# Patient Record
Sex: Female | Born: 1935
Health system: Southern US, Community
[De-identification: ages and names within clinical notes are randomized; demographics above are authoritative.]

## PROBLEM LIST (undated history)

## (undated) DIAGNOSIS — R9430 Abnormal result of cardiovascular function study, unspecified: Secondary | ICD-10-CM

## (undated) DIAGNOSIS — I10 Essential (primary) hypertension: Secondary | ICD-10-CM

## (undated) DIAGNOSIS — K5900 Constipation, unspecified: Secondary | ICD-10-CM

## (undated) DIAGNOSIS — N904 Leukoplakia of vulva: Secondary | ICD-10-CM

## (undated) DIAGNOSIS — E785 Hyperlipidemia, unspecified: Secondary | ICD-10-CM

## (undated) DIAGNOSIS — E7800 Pure hypercholesterolemia, unspecified: Secondary | ICD-10-CM

## (undated) DIAGNOSIS — Z9189 Other specified personal risk factors, not elsewhere classified: Secondary | ICD-10-CM

## (undated) DIAGNOSIS — I471 Supraventricular tachycardia: Secondary | ICD-10-CM

## (undated) DIAGNOSIS — J019 Acute sinusitis, unspecified: Secondary | ICD-10-CM

## (undated) DIAGNOSIS — R5383 Other fatigue: Secondary | ICD-10-CM

## (undated) DIAGNOSIS — J329 Chronic sinusitis, unspecified: Secondary | ICD-10-CM

## (undated) DIAGNOSIS — N952 Postmenopausal atrophic vaginitis: Secondary | ICD-10-CM

## (undated) DIAGNOSIS — F4321 Adjustment disorder with depressed mood: Secondary | ICD-10-CM

## (undated) DIAGNOSIS — D649 Anemia, unspecified: Secondary | ICD-10-CM

## (undated) DIAGNOSIS — K219 Gastro-esophageal reflux disease without esophagitis: Secondary | ICD-10-CM

## (undated) DIAGNOSIS — N809 Endometriosis, unspecified: Secondary | ICD-10-CM

## (undated) DIAGNOSIS — Z0000 Encounter for general adult medical examination without abnormal findings: Principal | ICD-10-CM

## (undated) DIAGNOSIS — M810 Age-related osteoporosis without current pathological fracture: Secondary | ICD-10-CM

## (undated) DIAGNOSIS — IMO0001 Reserved for inherently not codable concepts without codable children: Secondary | ICD-10-CM

## (undated) DIAGNOSIS — I498 Other specified cardiac arrhythmias: Secondary | ICD-10-CM

## (undated) DIAGNOSIS — H6190 Disorder of external ear, unspecified, unspecified ear: Secondary | ICD-10-CM

## (undated) DIAGNOSIS — N3281 Overactive bladder: Secondary | ICD-10-CM

## (undated) DIAGNOSIS — R000 Tachycardia, unspecified: Secondary | ICD-10-CM

## (undated) DIAGNOSIS — K5730 Diverticulosis of large intestine without perforation or abscess without bleeding: Secondary | ICD-10-CM

## (undated) DIAGNOSIS — M1990 Unspecified osteoarthritis, unspecified site: Secondary | ICD-10-CM

## (undated) DIAGNOSIS — F418 Other specified anxiety disorders: Secondary | ICD-10-CM

## (undated) DIAGNOSIS — D539 Nutritional anemia, unspecified: Secondary | ICD-10-CM

## (undated) DIAGNOSIS — Z8619 Personal history of other infectious and parasitic diseases: Secondary | ICD-10-CM

## (undated) HISTORY — DX: Other specified anxiety disorders: F41.8

## (undated) HISTORY — DX: Gastro-esophageal reflux disease without esophagitis: K21.9

## (undated) HISTORY — PX: ABDOMINAL HYSTERECTOMY: SHX81

## (undated) HISTORY — DX: Nutritional anemia, unspecified: D53.9

## (undated) HISTORY — PX: OTHER SURGICAL HISTORY: SHX169

## (undated) HISTORY — DX: Age-related osteoporosis without current pathological fracture: M81.0

## (undated) HISTORY — DX: Other fatigue: R53.83

## (undated) HISTORY — DX: Pure hypercholesterolemia, unspecified: E78.00

## (undated) HISTORY — DX: Unspecified osteoarthritis, unspecified site: M19.90

## (undated) HISTORY — DX: Endometriosis, unspecified: N80.9

## (undated) HISTORY — DX: Other specified cardiac arrhythmias: I49.8

## (undated) HISTORY — DX: Encounter for general adult medical examination without abnormal findings: Z00.00

## (undated) HISTORY — DX: Postmenopausal atrophic vaginitis: N95.2

## (undated) HISTORY — DX: Diverticulosis of large intestine without perforation or abscess without bleeding: K57.30

## (undated) HISTORY — DX: Acute sinusitis, unspecified: J01.90

## (undated) HISTORY — DX: Overactive bladder: N32.81

## (undated) HISTORY — DX: Disorder of external ear, unspecified, unspecified ear: H61.90

## (undated) HISTORY — DX: Adjustment disorder with depressed mood: F43.21

## (undated) HISTORY — DX: Leukoplakia of vulva: N90.4

## (undated) HISTORY — DX: Constipation, unspecified: K59.00

## (undated) HISTORY — PX: BREAST SURGERY: SHX581

## (undated) HISTORY — DX: Reserved for inherently not codable concepts without codable children: IMO0001

## (undated) HISTORY — DX: Tachycardia, unspecified: R00.0

## (undated) HISTORY — DX: Anemia, unspecified: D64.9

## (undated) HISTORY — DX: Chronic sinusitis, unspecified: J32.9

## (undated) HISTORY — DX: Supraventricular tachycardia: I47.1

## (undated) HISTORY — DX: Hyperlipidemia, unspecified: E78.5

## (undated) HISTORY — DX: Abnormal result of cardiovascular function study, unspecified: R94.30

## (undated) HISTORY — DX: Essential (primary) hypertension: I10

## (undated) HISTORY — DX: Personal history of other infectious and parasitic diseases: Z86.19

## (undated) HISTORY — DX: Other specified personal risk factors, not elsewhere classified: Z91.89

---

## 1998-06-15 ENCOUNTER — Ambulatory Visit (HOSPITAL_COMMUNITY): Admission: RE | Admit: 1998-06-15 | Discharge: 1998-06-15 | Payer: Self-pay | Admitting: Obstetrics and Gynecology

## 1999-07-09 ENCOUNTER — Encounter: Payer: Self-pay | Admitting: Obstetrics and Gynecology

## 1999-07-09 ENCOUNTER — Ambulatory Visit (HOSPITAL_COMMUNITY): Admission: RE | Admit: 1999-07-09 | Discharge: 1999-07-09 | Payer: Self-pay | Admitting: Obstetrics and Gynecology

## 2000-09-22 ENCOUNTER — Ambulatory Visit (HOSPITAL_COMMUNITY): Admission: RE | Admit: 2000-09-22 | Discharge: 2000-09-22 | Payer: Self-pay | Admitting: Family Medicine

## 2000-09-22 ENCOUNTER — Encounter: Payer: Self-pay | Admitting: Family Medicine

## 2003-09-16 ENCOUNTER — Encounter: Payer: Self-pay | Admitting: Internal Medicine

## 2005-01-07 ENCOUNTER — Ambulatory Visit: Payer: Self-pay | Admitting: Internal Medicine

## 2005-07-08 ENCOUNTER — Ambulatory Visit: Payer: Self-pay | Admitting: Internal Medicine

## 2005-07-26 ENCOUNTER — Ambulatory Visit: Payer: Self-pay | Admitting: Gastroenterology

## 2005-08-05 ENCOUNTER — Ambulatory Visit: Payer: Self-pay | Admitting: Gastroenterology

## 2005-08-05 ENCOUNTER — Encounter: Payer: Self-pay | Admitting: Internal Medicine

## 2005-08-14 ENCOUNTER — Ambulatory Visit: Payer: Self-pay | Admitting: Internal Medicine

## 2005-08-19 ENCOUNTER — Ambulatory Visit: Payer: Self-pay | Admitting: Internal Medicine

## 2005-11-11 DIAGNOSIS — I471 Supraventricular tachycardia, unspecified: Secondary | ICD-10-CM

## 2005-11-11 HISTORY — DX: Supraventricular tachycardia, unspecified: I47.10

## 2005-11-11 HISTORY — DX: Supraventricular tachycardia: I47.1

## 2005-11-22 ENCOUNTER — Ambulatory Visit: Payer: Self-pay | Admitting: Internal Medicine

## 2005-12-13 ENCOUNTER — Ambulatory Visit: Payer: Self-pay | Admitting: Internal Medicine

## 2006-01-23 ENCOUNTER — Ambulatory Visit: Payer: Self-pay | Admitting: Internal Medicine

## 2006-02-19 ENCOUNTER — Ambulatory Visit: Payer: Self-pay | Admitting: Internal Medicine

## 2006-03-04 ENCOUNTER — Ambulatory Visit: Payer: Self-pay

## 2006-03-04 ENCOUNTER — Encounter: Payer: Self-pay | Admitting: Cardiology

## 2006-03-07 ENCOUNTER — Encounter: Payer: Self-pay | Admitting: Internal Medicine

## 2006-03-14 ENCOUNTER — Ambulatory Visit: Payer: Self-pay | Admitting: Internal Medicine

## 2006-03-28 ENCOUNTER — Ambulatory Visit: Payer: Self-pay | Admitting: Internal Medicine

## 2006-05-13 ENCOUNTER — Ambulatory Visit: Payer: Self-pay | Admitting: Internal Medicine

## 2006-07-08 ENCOUNTER — Ambulatory Visit: Payer: Self-pay | Admitting: Internal Medicine

## 2006-07-18 ENCOUNTER — Ambulatory Visit: Payer: Self-pay | Admitting: Cardiology

## 2006-08-07 ENCOUNTER — Ambulatory Visit: Payer: Self-pay | Admitting: Cardiology

## 2006-08-13 ENCOUNTER — Ambulatory Visit: Payer: Self-pay | Admitting: Internal Medicine

## 2006-08-13 DIAGNOSIS — M810 Age-related osteoporosis without current pathological fracture: Secondary | ICD-10-CM

## 2006-08-13 DIAGNOSIS — D649 Anemia, unspecified: Secondary | ICD-10-CM | POA: Insufficient documentation

## 2006-08-13 DIAGNOSIS — D539 Nutritional anemia, unspecified: Secondary | ICD-10-CM

## 2006-08-13 HISTORY — DX: Nutritional anemia, unspecified: D53.9

## 2006-08-13 HISTORY — DX: Age-related osteoporosis without current pathological fracture: M81.0

## 2006-08-20 ENCOUNTER — Ambulatory Visit: Payer: Self-pay | Admitting: Cardiology

## 2006-09-29 ENCOUNTER — Ambulatory Visit: Payer: Self-pay | Admitting: Cardiology

## 2006-11-17 ENCOUNTER — Ambulatory Visit: Payer: Self-pay | Admitting: Internal Medicine

## 2006-11-17 LAB — CONVERTED CEMR LAB
Basophils Absolute: 0.1 10*3/uL (ref 0.0–0.1)
Basophils Relative: 1 % (ref 0.0–1.0)
Eosinophil percent: 1.2 % (ref 0.0–5.0)
HCT: 36.1 % (ref 36.0–46.0)
Hemoglobin: 12.4 g/dL (ref 12.0–15.0)
Lymphocytes Relative: 42.2 % (ref 12.0–46.0)
MCHC: 34.3 g/dL (ref 30.0–36.0)
MCV: 87.8 fL (ref 78.0–100.0)
Monocytes Absolute: 0.5 10*3/uL (ref 0.2–0.7)
Monocytes Relative: 7.6 % (ref 3.0–11.0)
Neutro Abs: 3.4 10*3/uL (ref 1.4–7.7)
Neutrophils Relative %: 48 % (ref 43.0–77.0)
Platelets: 237 10*3/uL (ref 150–400)
RBC: 4.11 M/uL (ref 3.87–5.11)
RDW: 11.4 % — ABNORMAL LOW (ref 11.5–14.6)
WBC: 7.1 10*3/uL (ref 4.5–10.5)

## 2007-03-19 ENCOUNTER — Ambulatory Visit: Payer: Self-pay | Admitting: Cardiology

## 2007-08-25 ENCOUNTER — Encounter: Payer: Self-pay | Admitting: Internal Medicine

## 2007-12-15 ENCOUNTER — Encounter: Payer: Self-pay | Admitting: Internal Medicine

## 2007-12-17 ENCOUNTER — Ambulatory Visit: Payer: Self-pay | Admitting: Internal Medicine

## 2007-12-17 DIAGNOSIS — E785 Hyperlipidemia, unspecified: Secondary | ICD-10-CM

## 2007-12-17 HISTORY — DX: Hyperlipidemia, unspecified: E78.5

## 2007-12-17 LAB — CONVERTED CEMR LAB
Basophils Absolute: 0 10*3/uL (ref 0.0–0.1)
Basophils Relative: 0.4 % (ref 0.0–1.0)
CO2: 33 meq/L — ABNORMAL HIGH (ref 19–32)
Creatinine, Ser: 0.8 mg/dL (ref 0.4–1.2)
Direct LDL: 159.6 mg/dL
Glucose, Urine, Semiquant: NEGATIVE
HCT: 35.1 % — ABNORMAL LOW (ref 36.0–46.0)
Hemoglobin: 11.8 g/dL — ABNORMAL LOW (ref 12.0–15.0)
Lymphocytes Relative: 49.3 % — ABNORMAL HIGH (ref 12.0–46.0)
MCHC: 33.7 g/dL (ref 30.0–36.0)
Monocytes Absolute: 0.5 10*3/uL (ref 0.2–0.7)
Neutro Abs: 2.2 10*3/uL (ref 1.4–7.7)
Neutrophils Relative %: 39.4 % — ABNORMAL LOW (ref 43.0–77.0)
Potassium: 3.6 meq/L (ref 3.5–5.1)
RDW: 11.7 % (ref 11.5–14.6)
Sodium: 141 meq/L (ref 135–145)
TSH: 2.48 microintl units/mL (ref 0.35–5.50)
Total Bilirubin: 1 mg/dL (ref 0.3–1.2)
Total CHOL/HDL Ratio: 3.4
Total Protein: 7.5 g/dL (ref 6.0–8.3)
WBC Urine, dipstick: NEGATIVE
pH: 5.5

## 2007-12-21 ENCOUNTER — Ambulatory Visit: Payer: Self-pay | Admitting: Cardiology

## 2007-12-28 ENCOUNTER — Ambulatory Visit: Payer: Self-pay | Admitting: Internal Medicine

## 2007-12-28 DIAGNOSIS — K219 Gastro-esophageal reflux disease without esophagitis: Secondary | ICD-10-CM

## 2007-12-28 HISTORY — DX: Gastro-esophageal reflux disease without esophagitis: K21.9

## 2008-01-08 ENCOUNTER — Ambulatory Visit: Payer: Self-pay | Admitting: Gastroenterology

## 2008-01-21 ENCOUNTER — Encounter: Payer: Self-pay | Admitting: Internal Medicine

## 2008-01-21 ENCOUNTER — Ambulatory Visit: Payer: Self-pay | Admitting: Gastroenterology

## 2008-02-01 ENCOUNTER — Ambulatory Visit: Payer: Self-pay | Admitting: Internal Medicine

## 2008-02-01 LAB — CONVERTED CEMR LAB
Basophils Relative: 1.1 % — ABNORMAL HIGH (ref 0.0–1.0)
Ferritin: 90.1 ng/mL (ref 10.0–291.0)
Iron: 99 ug/dL (ref 42–145)
Monocytes Relative: 8.2 % (ref 3.0–11.0)
Platelets: 212 10*3/uL (ref 150–400)
RBC: 4.16 M/uL (ref 3.87–5.11)
RDW: 11.9 % (ref 11.5–14.6)
Transferrin: 228.8 mg/dL (ref >=212.0)
Vitamin B-12: 210 pg/mL — ABNORMAL LOW (ref 211–911)

## 2008-02-04 ENCOUNTER — Inpatient Hospital Stay (HOSPITAL_COMMUNITY): Admission: EM | Admit: 2008-02-04 | Discharge: 2008-02-06 | Payer: Self-pay | Admitting: Emergency Medicine

## 2008-02-09 ENCOUNTER — Ambulatory Visit: Payer: Self-pay | Admitting: Internal Medicine

## 2008-03-10 ENCOUNTER — Ambulatory Visit: Payer: Self-pay | Admitting: Internal Medicine

## 2008-05-06 ENCOUNTER — Telehealth: Payer: Self-pay | Admitting: Internal Medicine

## 2008-07-14 ENCOUNTER — Ambulatory Visit: Payer: Self-pay | Admitting: Internal Medicine

## 2008-07-15 ENCOUNTER — Encounter: Payer: Self-pay | Admitting: Internal Medicine

## 2008-07-15 LAB — HM MAMMOGRAPHY: HM Mammogram: NORMAL

## 2008-10-26 ENCOUNTER — Ambulatory Visit: Payer: Self-pay | Admitting: Internal Medicine

## 2008-12-27 ENCOUNTER — Ambulatory Visit: Payer: Self-pay | Admitting: Cardiology

## 2009-02-02 ENCOUNTER — Ambulatory Visit: Payer: Self-pay | Admitting: Internal Medicine

## 2009-02-06 LAB — CONVERTED CEMR LAB
Alkaline Phosphatase: 66 units/L (ref 39–117)
Amylase: 53 units/L (ref 27–131)
Basophils Absolute: 0.1 10*3/uL (ref 0.0–0.1)
Basophils Relative: 0.7 % (ref 0.0–3.0)
Bilirubin, Direct: 0 mg/dL (ref 0.0–0.3)
CO2: 33 meq/L — ABNORMAL HIGH (ref 19–32)
Calcium: 10.4 mg/dL (ref 8.4–10.5)
Creatinine, Ser: 0.9 mg/dL (ref 0.4–1.2)
Eosinophils Absolute: 0.1 10*3/uL (ref 0.0–0.7)
HDL: 79.1 mg/dL (ref >39.00)
Lymphocytes Relative: 8.5 % — ABNORMAL LOW (ref 12.0–46.0)
MCHC: 35.7 g/dL (ref 30.0–36.0)
Neutrophils Relative %: 85.4 % — ABNORMAL HIGH (ref 43.0–77.0)
RBC: 4.12 M/uL (ref 3.87–5.11)
Total Bilirubin: 0.8 mg/dL (ref 0.3–1.2)
Total CHOL/HDL Ratio: 3
Total Protein: 8.7 g/dL — ABNORMAL HIGH (ref 6.0–8.3)
Triglycerides: 125 mg/dL (ref 0.0–149.0)
VLDL: 25 mg/dL (ref 0.0–40.0)

## 2009-02-16 ENCOUNTER — Ambulatory Visit: Payer: Self-pay | Admitting: Internal Medicine

## 2009-02-20 LAB — CONVERTED CEMR LAB
BUN: 16 mg/dL (ref 6–23)
Basophils Absolute: 0.1 10*3/uL (ref 0.0–0.1)
Calcium: 9.7 mg/dL (ref 8.4–10.5)
Creatinine, Ser: 0.8 mg/dL (ref 0.4–1.2)
Eosinophils Relative: 2.3 % (ref 0.0–5.0)
GFR calc non Af Amer: 74.65 mL/min (ref >60)
Monocytes Relative: 7.6 % (ref 3.0–12.0)
Neutrophils Relative %: 43.8 % (ref 43.0–77.0)
Platelets: 195 10*3/uL (ref 150.0–400.0)
RDW: 11.6 % (ref 11.5–14.6)
WBC: 5.7 10*3/uL (ref 4.5–10.5)

## 2009-05-11 ENCOUNTER — Telehealth: Payer: Self-pay | Admitting: Internal Medicine

## 2009-05-18 ENCOUNTER — Ambulatory Visit: Payer: Self-pay | Admitting: Internal Medicine

## 2009-07-12 ENCOUNTER — Telehealth: Payer: Self-pay | Admitting: Cardiology

## 2009-08-10 ENCOUNTER — Ambulatory Visit: Payer: Self-pay | Admitting: Internal Medicine

## 2009-11-11 DIAGNOSIS — R5383 Other fatigue: Secondary | ICD-10-CM

## 2009-11-11 HISTORY — DX: Other fatigue: R53.83

## 2009-11-13 ENCOUNTER — Ambulatory Visit: Payer: Self-pay | Admitting: Cardiology

## 2009-12-27 ENCOUNTER — Ambulatory Visit: Payer: Self-pay | Admitting: Cardiology

## 2010-01-30 ENCOUNTER — Ambulatory Visit: Payer: Self-pay | Admitting: Family Medicine

## 2010-02-16 ENCOUNTER — Telehealth: Payer: Self-pay | Admitting: Family Medicine

## 2010-02-28 ENCOUNTER — Ambulatory Visit: Payer: Self-pay | Admitting: Internal Medicine

## 2010-03-26 LAB — CONVERTED CEMR LAB
ALT: 14 units/L (ref 0–35)
AST: 18 units/L (ref 0–37)
Albumin: 3.9 g/dL (ref 3.5–5.2)
Alkaline Phosphatase: 48 units/L (ref 39–117)
Basophils Absolute: 0.1 10*3/uL (ref 0.0–0.1)
Basophils Relative: 1 % (ref 0.0–3.0)
Bilirubin, Direct: 0 mg/dL (ref 0.0–0.3)
CO2: 34 meq/L — ABNORMAL HIGH (ref 19–32)
Creatinine, Ser: 0.9 mg/dL (ref 0.4–1.2)
Direct LDL: 131.9 mg/dL
Eosinophils Absolute: 0.1 10*3/uL (ref 0.0–0.7)
Eosinophils Relative: 1.7 % (ref 0.0–5.0)
GFR calc non Af Amer: 64.98 mL/min (ref >60)
Glucose, Bld: 89 mg/dL (ref 70–99)
HCT: 35.1 % — ABNORMAL LOW (ref 36.0–46.0)
HDL: 86.4 mg/dL (ref >39.00)
Hemoglobin: 11.9 g/dL — ABNORMAL LOW (ref 12.0–15.0)
Lymphocytes Relative: 48.6 % — ABNORMAL HIGH (ref 12.0–46.0)
Lymphs Abs: 2.4 10*3/uL (ref 0.7–4.0)
Monocytes Relative: 8.3 % (ref 3.0–12.0)
Neutro Abs: 2 10*3/uL (ref 1.4–7.7)
Platelets: 223 10*3/uL (ref 150.0–400.0)
Potassium: 3.5 meq/L (ref 3.5–5.1)
RBC: 3.96 M/uL (ref 3.87–5.11)
RDW: 13.1 % (ref 11.5–14.6)
Sodium: 143 meq/L (ref 135–145)
TSH: 3.21 microintl units/mL (ref 0.35–5.50)
Total CHOL/HDL Ratio: 3
Total Protein: 8.3 g/dL (ref 6.0–8.3)
VLDL: 27.2 mg/dL (ref 0.0–40.0)
WBC: 5 10*3/uL (ref 4.5–10.5)

## 2010-04-02 ENCOUNTER — Ambulatory Visit: Payer: Self-pay | Admitting: Internal Medicine

## 2010-04-02 LAB — CONVERTED CEMR LAB
Basophils Relative: 1.2 % (ref 0.0–3.0)
Eosinophils Relative: 1.3 % (ref 0.0–5.0)
Ferritin: 57.5 ng/mL (ref 10.0–291.0)
Iron: 114 ug/dL (ref 42–145)
Lymphocytes Relative: 47.7 % — ABNORMAL HIGH (ref 12.0–46.0)
MCV: 88.5 fL (ref 78.0–100.0)
Monocytes Absolute: 0.5 10*3/uL (ref 0.1–1.0)
Monocytes Relative: 8.1 % (ref 3.0–12.0)
Neutrophils Relative %: 41.7 % — ABNORMAL LOW (ref 43.0–77.0)
Platelets: 228 10*3/uL (ref 150.0–400.0)
RBC: 3.96 M/uL (ref 3.87–5.11)
Saturation Ratios: 29.1 % (ref 20.0–50.0)
Transferrin: 280 mg/dL (ref 212.0–360.0)
Vitamin B-12: 238 pg/mL (ref 211–911)
WBC: 5.9 10*3/uL (ref 4.5–10.5)

## 2010-05-02 ENCOUNTER — Encounter: Payer: Self-pay | Admitting: Internal Medicine

## 2010-07-26 ENCOUNTER — Ambulatory Visit: Payer: Self-pay | Admitting: Internal Medicine

## 2010-09-17 ENCOUNTER — Ambulatory Visit: Payer: Self-pay | Admitting: Cardiology

## 2010-09-17 DIAGNOSIS — I498 Other specified cardiac arrhythmias: Secondary | ICD-10-CM

## 2010-09-17 HISTORY — DX: Other specified cardiac arrhythmias: I49.8

## 2010-12-11 NOTE — Assessment & Plan Note (Signed)
Summary: ear wash/cjr   Vital Signs:  Patient profile:   77 year old female Temp:     98.6 degrees F oral BP sitting:   140 / 74  (left arm) Cuff size:   regular  Vitals Entered By: Sid Falcon LPN (January 30, 2010 11:22 AM) CC: Hearing problems, ear wash?   History of Present Illness: Patient seen with decreased hearing bilaterally. Went for hearing screen yesterday was told she had bilateral cerumen impactions. No history of prior. Denies any dizziness. No ear pain.  Occ tinnitus.  Allergies (verified): No Known Drug Allergies  Past History:  Past Medical History: Last updated: 12/27/2009 SVT...3-4 beats... Holter... 2007 sinus tachycardia... mild... resting Fatigue with exercise... January, 2011... responded to increased beta blocker dose Dyslipidemia Anemia-iron deficiency GERD Osteoporosis EF 55%... echo... April, 2007 Nuclear stress test... November, 2004... no ischemia PMH reviewed for relevance  Review of Systems      See HPI  Physical Exam  General:  Well-developed,well-nourished,in no acute distress; alert,appropriate and cooperative throughout examination Ears:  bilateral cerumen impactions.  following irrigation of both canals cerumen is removed in entirety. Eardrums appear normal. Mouth:  Oral mucosa and oropharynx without lesions or exudates.  Teeth in good repair. Lungs:  Normal respiratory effort, chest expands symmetrically. Lungs are clear to auscultation, no crackles or wheezes. Heart:  normal rate and regular rhythm.     Impression & Recommendations:  Problem # 1:  CERUMEN IMPACTION (ICD-380.4) Assessment New irrigation both canals with successfull removal of cerumen.  Complete Medication List: 1)  Fortical 200 Unit/act Nasal Soln (Calcitonin (salmon)) .Marland Kitchen.. 1 puff alternating nostril daily 2)  Famotidine 20 Mg Tabs (Famotidine) .... Once daily prn 3)  Hydrochlorothiazide 25 Mg Tabs (Hydrochlorothiazide) .... Qd 4)  Premarin 0.3 Mg Tabs  (Estrogens conjugated) .... Once daily prn 5)  Slow Release Iron 50 Mg Tbcr (Iron) .... Once daily prn 6)  Diltiazem Hcl Cr 120 Mg Cp12 (Diltiazem hcl) .... Qd 7)  Metoprolol Succinate 50 Mg Xr24h-tab (Metoprolol succinate) .... Take one tablet by mouth daily 8)  Miralax Powd (Polyethylene glycol 3350) .... As needed 9)  Aspirin 81 Mg Tbec (Aspirin) .... Once daily 10)  Caltrate 600+d 600-400 Mg-unit Tabs (Calcium carbonate-vitamin d) .... Two times a day 11)  Fish Oil 1000 Mg Caps (Omega-3 fatty acids) .... Tid 12)  Vitamin D 1000 Unit Caps (Cholecalciferol) .... Once daily

## 2010-12-11 NOTE — Miscellaneous (Signed)
  Clinical Lists Changes  Observations: Added new observation of PAST MED HX: SVT...3-4 beats... Holter... 2007 sinus tachycardia... mild... resting Dyslipidemia Anemia-iron deficiency GERD Osteoporosis EF 55%... echo... April, 2007 Nuclear stress test... November, 2004... no ischemia  (11/13/2009 10:42)       Past History:  Past Medical History: SVT...3-4 beats... Holter... 2007 sinus tachycardia... mild... resting Dyslipidemia Anemia-iron deficiency GERD Osteoporosis EF 55%... echo... April, 2007 Nuclear stress test... November, 2004... no ischemia

## 2010-12-11 NOTE — Assessment & Plan Note (Signed)
Summary: 6wks/sl      Allergies Added: NKDA  Visit Type:  Follow-up Primary Provider:  Dr.Bruce Swords  CC:  tachycardia.  History of Present Illness: The patient is seen for followup of fatigue and tachycardia. When I saw her recently it appeared that she might be having increased heart rate with exercise.  Her beta blocker dose was increased.  She is greatly improved.  Current Medications (verified): 1)  Fortical 200 Unit/act Nasal Soln (Calcitonin (Salmon)) .Marland Kitchen.. 1 Puff Alternating Nostril Daily 2)  Famotidine 20 Mg Tabs (Famotidine) .... Once Daily Prn 3)  Hydrochlorothiazide 25 Mg Tabs (Hydrochlorothiazide) .... Qd 4)  Premarin 0.3 Mg Tabs (Estrogens Conjugated) .... Once Daily Prn 5)  Slow Release Iron 50 Mg Tbcr (Iron) .... Once Daily Prn 6)  Diltiazem Hcl Cr 120 Mg Cp12 (Diltiazem Hcl) .... Qd 7)  Metoprolol Succinate 50 Mg Xr24h-Tab (Metoprolol Succinate) .... Take One Tablet By Mouth Daily 8)  Miralax   Powd (Polyethylene Glycol 3350) .... As Needed 9)  Aspirin 81 Mg  Tbec (Aspirin) .... Once Daily 10)  Caltrate 600+d 600-400 Mg-Unit  Tabs (Calcium Carbonate-Vitamin D) .... Two Times A Day 11)  Fish Oil 1000 Mg  Caps (Omega-3 Fatty Acids) .... Tid 12)  Vitamin D 1000 Unit  Caps (Cholecalciferol) .... Once Daily  Allergies (verified): No Known Drug Allergies  Past History:  Past Medical History: SVT...3-4 beats... Holter... 2007 sinus tachycardia... mild... resting Fatigue with exercise... January, 2011... responded to increased beta blocker dose Dyslipidemia Anemia-iron deficiency GERD Osteoporosis EF 55%... echo... April, 2007 Nuclear stress test... November, 2004... no ischemia  Review of Systems       Patient denies fever, chills, headache, sweats, rash, change in vision, change in hearing, chest pain, cough, shortness of breath, nausea vomiting, urinary symptoms.  All of the systems are reviewed and are negative.  Vital Signs:  Patient profile:   77 year  old female Height:      63.5 inches Weight:      120 pounds BMI:     21.00 Pulse rate:   75 / minute BP sitting:   114 / 70  (left arm) Cuff size:   regular  Vitals Entered By: Hardin Negus, RMA (December 27, 2009 10:21 AM)  Physical Exam  General:  patient is quite stable in general. Eyes:  no xanthelasma. Neck:  no jugular venous distention. Lungs:  lungs are clear.  Respiratory effort is nonlabored. Heart:  cardiac exam reveals S1 and S2.  No clicks or significant murmurs. Abdomen:  abdomen soft. Extremities:  no peripheral edema. Psych:  patient is oriented to person time and place.  Affect is normal.   Impression & Recommendations:  Problem # 1:  * FATIGUE WITH EXERCISE With the  additional beta blocker the patient's fatigue with exercise is greatly improved.we will keep her on this dose of medication.  Problem # 2:  GRIEF REACTION, ACUTE (ICD-309.0) The patient appears to be doing better in this regard also.  Problem # 3:  SUPRAVENTRICULAR TACHYCARDIA (ICD-427.89)  Her updated medication list for this problem includes:    Diltiazem Hcl Cr 120 Mg Cp12 (Diltiazem hcl) ..... Qd    Metoprolol Succinate 50 Mg Xr24h-tab (Metoprolol succinate) .Marland Kitchen... Take one tablet by mouth daily    Aspirin 81 Mg Tbec (Aspirin) ..... Once daily There is no evidence that she's having any supraventricular tachycardia.  No change in therapy.  Patient Instructions: 1)  Follow up in 9 months

## 2010-12-11 NOTE — Assessment & Plan Note (Signed)
Summary: rov per pt call/lg  Medications Added FAMOTIDINE 20 MG TABS (FAMOTIDINE) once daily prn PREMARIN 0.3 MG TABS (ESTROGENS CONJUGATED) once daily prn SLOW RELEASE IRON 50 MG TBCR (IRON) once daily prn METOPROLOL SUCCINATE 50 MG XR24H-TAB (METOPROLOL SUCCINATE) Take one tablet by mouth daily      Allergies Added: NKDA  Visit Type:  Follow-up Primary Provider:  Dr.Bruce Swords  CC:  palpitations and fatigue.  History of Present Illness: The patient is seen today for followup of supraventricular tachycardia.  This has been limited in the past.  She has had some resting sinus tachycardia also in the past.  Unfortunately the patient's daughter died suddenly in Jun 20, [id number].  She of course is grieving and also taking care of her husband who has significant lung disease.  She is under significant stress.  She notes increased heart rate.  She also feel significant fatigue when she tries to make up the bed.  She's had some slight chest discomfort.  She has had some discomfort in her jaw.  There's been no syncope or presyncope.  Current Medications (verified): 1)  Fortical 200 Unit/act Nasal Soln (Calcitonin (Salmon)) .Marland Kitchen.. 1 Puff Alternating Nostril Daily 2)  Famotidine 20 Mg Tabs (Famotidine) .... Once Daily Prn 3)  Hydrochlorothiazide 25 Mg Tabs (Hydrochlorothiazide) .... Qd 4)  Premarin 0.3 Mg Tabs (Estrogens Conjugated) .... Once Daily Prn 5)  Slow Release Iron 50 Mg Tbcr (Iron) .... Once Daily Prn 6)  Diltiazem Hcl Cr 120 Mg Cp12 (Diltiazem Hcl) .... Qd 7)  Metoprolol Tartrate 25 Mg Tabs (Metoprolol Tartrate) .... Qd 8)  Miralax   Powd (Polyethylene Glycol 3350) .... As Needed 9)  Aspirin 81 Mg  Tbec (Aspirin) .... Once Daily 10)  Caltrate 600+d 600-400 Mg-Unit  Tabs (Calcium Carbonate-Vitamin D) .... Two Times A Day 11)  Fish Oil 1000 Mg  Caps (Omega-3 Fatty Acids) .... Tid 12)  Vitamin D 1000 Unit  Caps (Cholecalciferol) .... Once Daily  Allergies (verified): No Known Drug  Allergies  Past History:  Past Medical History: Last updated: 11/13/2009 SVT...3-4 beats... Holter... 2007 sinus tachycardia... mild... resting Dyslipidemia Anemia-iron deficiency GERD Osteoporosis EF 55%... echo... April, 2007 Nuclear stress test... November, 2004... no ischemia  Review of Systems       Patient denies fever, chills, sweats, rash, headache, change in vision, change in hearing, cough, nausea vomiting, urinary symptoms.  All other systems are reviewed and are negative.  Vital Signs:  Patient profile:   77 year old female Height:      63.5 inches Weight:      120 pounds BMI:     21.00 Pulse rate:   89 / minute BP sitting:   125 / 84  (left arm) Cuff size:   regular  Vitals Entered By: Oswald Hillock (November 13, 2009 4:21 PM)  Physical Exam  General:  in general the patient appears fatigued by the stress in her life. Head:  head is atraumatic. Eyes:  eyes reveal no xanthelasma. Neck:  neck reveals no jugular venous distention. Chest Wall:  no chest wall tenderness. Lungs:  lungs are clear.  Respiratory effort is nonlabored. Heart:  cardiac exam reveals an S1-S2.  There are no clicks or significant murmurs. Abdomen:  abdomen soft. Msk:  no musculoskeletal deformities. Extremities:  no peripheral edema. Skin:  no skin rashes. Psych:  patient is oriented to person time and place.  Affect is normal.   Impression & Recommendations:  Problem # 1:  GRIEF REACTION, ACUTE (ICD-309.0)  The patient has significant continued grief over the loss of her daughter.  I tried to be reassuring.  Problem # 2:  SUPRAVENTRICULAR TACHYCARDIA (ICD-427.89)  Her updated medication list for this problem includes:    Diltiazem Hcl Cr 120 Mg Cp12 (Diltiazem hcl) ..... Qd    Metoprolol Succinate 50 Mg Xr24h-tab (Metoprolol succinate) .Marland Kitchen... Take one tablet by mouth daily    Aspirin 81 Mg Tbec (Aspirin) ..... Once daily There is history of supraventricular tachycardia but  only a few beats seen on Holter.  EKG is done today and reviewed by me.  There is normal sinus rhythm and EKG is normal overall.  There is no evidence of an ongoing arrhythmia at this time.  Orders: EKG w/ Interpretation (93000)  Problem # 3:  * FATIGUE WITH EXERCISE At this point it is very difficult to know what the etiology of her symptoms are.  We know that her LV function was normal at 2007.  Her last exercise test was in 2004.  However stress may be playing a major role in her symptoms.  I've chosen to slightly increase her beta blocker dose and see her back in approximately 6 weeks.  We will then decide if more aggressive testing is needed.  Patient Instructions: 1)  Increase Metoprolol to 50mg  daily 2)  Follow up in 6 weeks Prescriptions: DILTIAZEM HCL CR 120 MG CP12 (DILTIAZEM HCL) qd  #90 x 3   Entered by:   Meredith Staggers, RN   Authorized by:   Talitha Givens, MD, [facility]   Signed by:   Meredith Staggers, RN on 11/13/2009   Method used:   Electronically to        [facility]* (mail-order)             ,          Ph: [phone]       Fax: [phone]   RxID:   [id number] METOPROLOL SUCCINATE 50 MG XR24H-TAB (METOPROLOL SUCCINATE) Take one tablet by mouth daily  #180 x 3   Entered by:   Meredith Staggers, RN   Authorized by:   Talitha Givens, MD, [facility]   Signed by:   Meredith Staggers, RN on 11/13/2009   Method used:   Electronically to        [facility]* (mail-order)             ,          Ph: [phone]       Fax: [phone]   RxID:   [id number]

## 2010-12-11 NOTE — Progress Notes (Signed)
Summary: request for nerve RX  Phone Note Call from Patient   Caller: Patient Call For: Birdie Sons MD Summary of Call: Pt's husband is in the hospital and she would like a RX for her nerves. Nicolette Bang ([place]) Have pharmacy call when ready and leave a message on her phone. Initial call taken by: Lynann Beaver CMA,  February 16, 2010 2:52 PM  Follow-up for Phone Call        Ativan .5, dispense 14 tablets directions one nightly p.r.n. no refills Follow-up by: Roderick Pee MD,  February 16, 2010 3:46 PM  Additional Follow-up for Phone Call Additional follow up Details #1::        called to [facility] ([place]) Additional Follow-up by: Lynann Beaver CMA,  February 16, 2010 3:49 PM    New/Updated Medications: ATIVAN 0.5 MG TABS (LORAZEPAM) one by mouth nightly Prescriptions: ATIVAN 0.5 MG TABS (LORAZEPAM) one by mouth nightly  #14 x 0   Entered by:   Lynann Beaver CMA   Authorized by:   Roderick Pee MD   Signed by:   Lynann Beaver CMA on 02/16/2010   Method used:   Telephoned to ...       [facility]  [address]  [phone]* (retail)       [address]       [place], [place]  [address]       Ph: [phone] or [phone]       Fax: [phone]   RxID:   [id number]

## 2010-12-11 NOTE — Assessment & Plan Note (Signed)
Summary: pt will come in fasting/njr/pt rescd from bump//ccm   Vital Signs:  Patient profile:   77 year old female Menstrual status:  hysterectomy Height:      63 inches Weight:      119 pounds BMI:     21.16 Pulse rate:   62 / minute Pulse rhythm:   regular Resp:     12 per minute BP sitting:   124 / 66  (left arm) Cuff size:   regular  Vitals Entered By: Gladis Riffle, RN (February 28, 2010 9:07 AM) CC: annual review of systems, fasting Is Patient Diabetic? No     Menstrual Status hysterectomy   Primary Care Provider:  Dr.Cierah Crader  CC:  annual review of systems and fasting.  History of Present Illness: CPX  Preventive Screening-Counseling & Management  Alcohol-Tobacco     Smoking Status: never  Current Problems (verified): 1)  Gerd  (ICD-530.81) 2)  Preventive Health Care  (ICD-V70.0) 3)  Hyperlipidemia  (ICD-272.4) 4)  Osteoporosis  (ICD-733.00) 5)  Supraventricular Tachycardia  (ICD-427.89) 6)  Anemia, Chronic  (ICD-281.9)  Current Medications (verified): 1)  Fortical 200 Unit/act Nasal Soln (Calcitonin (Salmon)) .Marland Kitchen.. 1 Puff Alternating Nostril Daily 2)  Famotidine 20 Mg Tabs (Famotidine) .... Once Daily Prn 3)  Hydrochlorothiazide 25 Mg Tabs (Hydrochlorothiazide) .... Qd 4)  Premarin 0.3 Mg Tabs (Estrogens Conjugated) .... Once Daily Prn 5)  Slow Release Iron 50 Mg Tbcr (Iron) .... Once Daily Prn 6)  Diltiazem Hcl Cr 120 Mg Cp12 (Diltiazem Hcl) .... Qd 7)  Metoprolol Succinate 50 Mg Xr24h-Tab (Metoprolol Succinate) .... Take One Tablet By Mouth Daily 8)  Miralax   Powd (Polyethylene Glycol 3350) .... As Needed 9)  Aspirin 81 Mg  Tbec (Aspirin) .... Once Daily 10)  Caltrate 600+d 600-400 Mg-Unit  Tabs (Calcium Carbonate-Vitamin D) .... Two Times A Day 11)  Fish Oil 1000 Mg  Caps (Omega-3 Fatty Acids) .... Tid 12)  Vitamin D 1000 Unit  Caps (Cholecalciferol) .... Once Daily  Allergies (verified): No Known Drug Allergies  Past History:  Past Medical  History: Last updated: 12/27/2009 SVT...3-4 beats... Holter... 2007 sinus tachycardia... mild... resting Fatigue with exercise... January, 2011... responded to increased beta blocker dose Dyslipidemia Anemia-iron deficiency GERD Osteoporosis EF 55%... echo... April, 2007 Nuclear stress test... November, 2004... no ischemia  Past Surgical History: Last updated: 12/28/2007 Hysterectomy with unilateral unilateral oophorectomy-bleeding Breast Implants R knee arthroscopy  Family History: Last updated: 02/28/2010 Father- Stomach Cancer Mother 2006---stroke 95yo (cervical CA age 21) daughter deceased ? cause  Social History: Last updated: 08/13/2006 Married Never Smoked Alcohol use-no  Risk Factors: Smoking Status: never (02/28/2010)  Family History: Father- Stomach Cancer Mother 2006---stroke 95yo (cervical CA age 3) daughter deceased ? cause  Review of Systems       All other systems reviewed and were negative   Physical Exam  General:  Well-developed,well-nourished,in no acute distress; alert,appropriate and cooperative throughout examination Head:  normocephalic and atraumatic.   Eyes:  pupils equal and pupils round.   Ears:  R ear normal and L ear normal.   Lungs:  normal respiratory effort and no intercostal retractions.   Heart:  normal rate and regular rhythm.   Abdomen:  soft and non-tender.   Msk:  No deformity or scoliosis noted of thoracic or lumbar spine.   Extremities:  No clubbing, cyanosis, edema, or deformity noted  Neurologic:  cranial nerves II-XII intact and gait normal.   Skin:  turgor normal and color normal.  Cervical Nodes:  no anterior cervical adenopathy and no posterior cervical adenopathy.   Psych:  memory intact for recent and remote and good eye contact.     Impression & Recommendations:  Problem # 1:  PREVENTIVE HEALTH CARE (ICD-V70.0)  health maint UTD  Orders: UA Dipstick w/o Micro (automated)  [phone]) Dermatology Referral  (Derma) Venipuncture (79892) TLB-BMP (Basic Metabolic Panel-BMET) (80048-METABOL) TLB-CBC Platelet - w/Differential (85025-CBCD) TLB-Hepatic/Liver Function Pnl (80076-HEPATIC) TLB-TSH (Thyroid Stimulating Hormone) (84443-TSH)  Problem # 2:  HYPERLIPIDEMIA (ICD-272.4) note HDL Labs Reviewed: SGOT: 21 (02/02/2009)   SGPT: 14 (02/02/2009)   HDL:79.10 (02/02/2009), 80.3 (12/17/2007)  LDL:DEL (12/17/2007)  Chol:260 (02/02/2009), 271 (12/17/2007)  Trig:125.0 (02/02/2009), 122 (12/17/2007)  Problem # 3:  ANEMIA, CHRONIC (ICD-281.9) has had GI evaluation check labs today Her updated medication list for this problem includes:    Slow Release Iron 50 Mg Tbcr (Iron) ..... Once daily prn  Hgb: 11.7 (02/16/2009)   Hct: 33.8 (02/16/2009)   Platelets: 195.0 (02/16/2009) RBC: 3.82 (02/16/2009)   RDW: 11.6 (02/16/2009)   WBC: 5.7 (02/16/2009) MCV: 88.4 (02/16/2009)   MCHC: 34.5 (02/16/2009) Ferritin: 90.1 (02/01/2008) Iron: 99 (02/01/2008)   % Sat: 30.9 (02/01/2008) B12: 210 (02/01/2008)   TSH: 2.45 (02/02/2009)  Complete Medication List: 1)  Fortical 200 Unit/act Nasal Soln (Calcitonin (salmon)) .Marland Kitchen.. 1 puff alternating nostril daily 2)  Famotidine 20 Mg Tabs (Famotidine) .... Once daily prn 3)  Hydrochlorothiazide 25 Mg Tabs (Hydrochlorothiazide) .... Qd 4)  Premarin 0.3 Mg Tabs (Estrogens conjugated) .... Once daily prn 5)  Slow Release Iron 50 Mg Tbcr (Iron) .... Once daily prn 6)  Diltiazem Hcl Cr 120 Mg Cp12 (Diltiazem hcl) .... Qd 7)  Metoprolol Succinate 50 Mg Xr24h-tab (Metoprolol succinate) .... Take one tablet by mouth daily 8)  Miralax Powd (Polyethylene glycol 3350) .... As needed 9)  Aspirin 81 Mg Tbec (Aspirin) .... Once daily 10)  Caltrate 600+d 600-400 Mg-unit Tabs (Calcium carbonate-vitamin d) .... Two times a day 11)  Fish Oil 1000 Mg Caps (Omega-3 fatty acids) .... Tid 12)  Vitamin D 1000 Unit Caps (Cholecalciferol) .... Once daily 13)  Alprazolam 0.25 Mg Tabs (Alprazolam)  .... 1/2-1 by mouth once daily as needed anxiety  Preventive Care Screening  Bone Density:    Date:  12/15/2007    Next Due:  12/2009    Results:  osteoporosis std dev  Mammogram:    Date:  07/15/2008    Next Due:  07/2009    Results:  normal    Patient Instructions: 1)  Please schedule a follow-up appointment in 6 months. 2)  Schedule your mammogram. Prescriptions: ALPRAZOLAM 0.25 MG TABS (ALPRAZOLAM) 1/2-1 by mouth once daily as needed anxiety  #10 x 1   Entered and Authorized by:   Birdie Sons MD   Signed by:   Birdie Sons MD on 02/28/2010   Method used:   Print then Give to Patient   RxID:   [id number] PREMARIN 0.3 MG TABS (ESTROGENS CONJUGATED) once daily prn  #90 x 3   Entered and Authorized by:   Birdie Sons MD   Signed by:   Birdie Sons MD on 02/28/2010   Method used:   Electronically to        [facility]* (mail-order)             ,          Ph: [phone]       Fax: [phone]   RxID:   [id number] HYDROCHLOROTHIAZIDE 25 MG TABS (HYDROCHLOROTHIAZIDE)  qd  #90 x 4   Entered and Authorized by:   Birdie Sons MD   Signed by:   Birdie Sons MD on 02/28/2010   Method used:   Electronically to        [facility]* (mail-order)             ,          Ph: [phone]       Fax: [phone]   RxID:   [id number] FORTICAL 200 UNIT/ACT NASAL SOLN (CALCITONIN (SALMON)) 1 puff alternating nostril daily  #90 day x 3   Entered and Authorized by:   Birdie Sons MD   Signed by:   Birdie Sons MD on 02/28/2010   Method used:   Electronically to        [facility]* (mail-order)             ,          Ph: [phone]       Fax: [phone]   RxID:   [id number]    Preventive Care Screening  Bone Density:    Date:  12/15/2007    Next Due:  12/2009    Results:  osteoporosis std dev  Mammogram:    Date:  07/15/2008    Next Due:  07/2009    Results:  normal   Appended Document: Orders Update     Clinical Lists  Changes  Orders: Added new Test order of TLB-Lipid Panel (80061-LIPID) - Signed      Appended Document: pt will come in fasting/njr/pt rescd from bump//ccm  Laboratory Results   Urine Tests    Routine Urinalysis   Color: yellow Appearance: Clear Glucose: negative   (Normal Range: Negative) Bilirubin: negative   (Normal Range: Negative) Ketone: negative   (Normal Range: Negative) Spec. Gravity: 1.015   (Normal Range: 1.003-1.035) Blood: negative   (Normal Range: Negative) pH: 7.0   (Normal Range: 5.0-8.0) Protein: negative   (Normal Range: Negative) Urobilinogen: 0.2   (Normal Range: 0-1) Nitrite: negative   (Normal Range: Negative) Leukocyte Esterace: negative   (Normal Range: Negative)    Comments: Rita Ohara  February 28, 2010 10:54 AM

## 2010-12-11 NOTE — Assessment & Plan Note (Signed)
Summary: Karina [name]      Allergies Added: NKDA  Visit Type:  Follow-up Primary Provider:  Dr.Bruce Swords  CC:  tachycardia.  History of Present Illness: The patient is seen for cardiology followup.  I saw her last February, 2011.  She had some 3-4 beat runs of supraventricular tachycardia.  She also had resting sinus tachycardia and fatigue with exercise.   We increased her beta blocker in January, 2011.  She felt significantly better with this. She continues to do well.  She does not have any shortness of breath or chest pain and she is exercising well.  Current Medications (verified): 1)  Fortical 200 Unit/act Nasal Soln (Calcitonin ([place])) .Marland Kitchen.. 1 Puff Alternating Nostril Daily 2)  Hydrochlorothiazide 25 Mg Tabs (Hydrochlorothiazide) .... Qd 3)  Premarin 0.3 Mg Tabs (Estrogens Conjugated) .... Once Daily Prn 4)  Diltiazem Hcl Cr 120 Mg Cp12 (Diltiazem Hcl) .... Qd 5)  Metoprolol Succinate 50 Mg Xr24h-Tab (Metoprolol Succinate) .... Take One Tablet By Mouth Daily 6)  Miralax   Powd (Polyethylene Glycol 3350) .... As Needed 7)  Aspirin 81 Mg  Tbec (Aspirin) .... Once Daily 8)  Caltrate 600+d 600-400 Mg-Unit  Tabs (Calcium Carbonate-Vitamin D) .... Two Times A Day 9)  Fish Oil 1000 Mg  Caps (Omega-3 Fatty Acids) .... Tid 10)  Vitamin D 1000 Unit  Caps (Cholecalciferol) .... Once Daily  Allergies (verified): No Known Drug Allergies  Past History:  Past Medical History: SVT...3-4 beats... Holter... 2007 sinus tachycardia... mild... resting Fatigue with exercise... January, 2011... responded to increased beta blocker dose Dyslipidemia Anemia-iron deficiency GERD Osteoporosis EF 55%... echo... April, 2007 Nuclear stress test... November, 2004... no ischemia..  Review of Systems       Patient denies fever, chills, headache, sweats, rash, change in vision, change in hearing, chest pain, cough, nausea vomiting, urinary symptoms.  All of the systems are reviewed and are  negative  Vital Signs:  Patient profile:   77 year old female Menstrual status:  hysterectomy Height:      63 inches Weight:      119 pounds BMI:     21.16 Pulse rate:   70 / minute BP sitting:   118 / 72  (right arm) Cuff size:   regular  Vitals Entered By: Hardin Negus, RMA (September 17, 2010 10:46 AM)  Physical Exam  General:  patient is quite stable. Eyes:  no xanthelasma. Neck:  no jugular venous distention. Lungs:  lungs are clear.  Respiratory effort is nonlabored. Heart:  cardiac exam reveals S1-S2.  No clicks or significant murmurs. Abdomen:  abdomen is soft. Extremities:  no peripheral edema. Psych:  patient is oriented to person time and place.  Affect is normal.   Impression & Recommendations:  Problem # 1:  SINUS TACHYCARDIA (ICD-427.89)  Her updated medication list for this problem includes:    Diltiazem Hcl Cr 120 Mg Cp12 (Diltiazem hcl) ..... Qd    Metoprolol Succinate 50 Mg Xr24h-tab (Metoprolol succinate) .Marland Kitchen... Take one tablet by mouth daily    Aspirin 81 Mg Tbec (Aspirin) ..... Once daily The patient responded well to increasing her beta blockers in January, 2011.  She feels very good.  No change in therapy. I will see the patient for cardiology followup in 2 years.  Problem # 2:  SUPRAVENTRICULAR TACHYCARDIA (ICD-427.89)  Her updated medication list for this problem includes:    Diltiazem Hcl Cr 120 Mg Cp12 (Diltiazem hcl) ..... Qd    Metoprolol Succinate 50 Mg Xr24h-tab (Metoprolol  succinate) .Marland Kitchen... Take one tablet by mouth daily    Aspirin 81 Mg Tbec (Aspirin) ..... Once daily The patient also has some supraventricular tachycardia.  We will leave her on diltiazem for this.  No change in therapy.  Patient Instructions: 1)  Your physician wants you to follow-up in:  2 years.  You will receive a reminder letter in the mail two months in advance. If you don't receive a letter, please call our office to schedule the follow-up  appointment. Prescriptions: METOPROLOL SUCCINATE 50 MG XR24H-TAB (METOPROLOL SUCCINATE) Take one tablet by mouth daily  #90 x 3   Entered by:   Meredith Staggers, RN   Authorized by:   Talitha Givens, MD, [facility]   Signed by:   Meredith Staggers, RN on 09/17/2010   Method used:   Faxed to ...       [facility] (mail-order)             , [place]         Ph: [phone]       Fax: [phone]   RxID:   [id number] DILTIAZEM HCL CR 120 MG CP12 (DILTIAZEM HCL) qd  #90 x 3   Entered by:   Meredith Staggers, RN   Authorized by:   Talitha Givens, MD, [facility]   Signed by:   Meredith Staggers, RN on 09/17/2010   Method used:   Faxed to ...       [facility] (mail-order)             , [place]         Ph: [phone]       Fax: [phone]   RxID:   [id number]

## 2010-12-11 NOTE — Assessment & Plan Note (Signed)
Summary: FLU SHOT/RCD  Flu Vaccine Consent Questions     Do you have a history of severe allergic reactions to this vaccine? no    Any prior history of allergic reactions to egg and/or gelatin? no    Do you have a sensitivity to the preservative Thimersol? no    Do you have a past history of Guillan-Barre Syndrome? no    Do you currently have an acute febrile illness? no    Have you ever had a severe reaction to latex? no    Vaccine information given and explained to patient? yes    Are you currently pregnant? no    Lot Number:[id number]   Exp Date:05/11/2011   Site Given  Left Deltoid IM  Nurse Visit   Allergies: No Known Drug Allergies  Orders Added: 1)  Admin 1st Vaccine [90471] 2)  Flu Vaccine 86yrs + [16109]

## 2011-01-28 ENCOUNTER — Encounter (INDEPENDENT_AMBULATORY_CARE_PROVIDER_SITE_OTHER): Payer: Self-pay | Admitting: Family Medicine

## 2011-01-28 ENCOUNTER — Encounter: Payer: Self-pay | Admitting: Family Medicine

## 2011-01-28 ENCOUNTER — Other Ambulatory Visit (HOSPITAL_COMMUNITY)
Admission: RE | Admit: 2011-01-28 | Discharge: 2011-01-28 | Disposition: A | Discharge status: Home / Self Care | Payer: Medicare Other | Source: Ambulatory Visit | Attending: Family Medicine | Admitting: Family Medicine

## 2011-01-28 ENCOUNTER — Other Ambulatory Visit: Payer: Self-pay | Admitting: Family Medicine

## 2011-01-28 DIAGNOSIS — K5900 Constipation, unspecified: Secondary | ICD-10-CM

## 2011-01-28 DIAGNOSIS — Z8619 Personal history of other infectious and parasitic diseases: Secondary | ICD-10-CM

## 2011-01-28 DIAGNOSIS — R030 Elevated blood-pressure reading, without diagnosis of hypertension: Secondary | ICD-10-CM | POA: Insufficient documentation

## 2011-01-28 DIAGNOSIS — I498 Other specified cardiac arrhythmias: Secondary | ICD-10-CM

## 2011-01-28 DIAGNOSIS — K5730 Diverticulosis of large intestine without perforation or abscess without bleeding: Secondary | ICD-10-CM

## 2011-01-28 DIAGNOSIS — Z9189 Other specified personal risk factors, not elsewhere classified: Secondary | ICD-10-CM | POA: Insufficient documentation

## 2011-01-28 DIAGNOSIS — Z01419 Encounter for gynecological examination (general) (routine) without abnormal findings: Secondary | ICD-10-CM | POA: Insufficient documentation

## 2011-01-28 DIAGNOSIS — E785 Hyperlipidemia, unspecified: Secondary | ICD-10-CM

## 2011-01-28 HISTORY — DX: Personal history of other infectious and parasitic diseases: Z86.19

## 2011-01-28 HISTORY — DX: Constipation, unspecified: K59.00

## 2011-01-28 HISTORY — DX: Diverticulosis of large intestine without perforation or abscess without bleeding: K57.30

## 2011-01-28 HISTORY — DX: Other specified personal risk factors, not elsewhere classified: Z91.89

## 2011-02-04 ENCOUNTER — Telehealth: Payer: Self-pay

## 2011-02-04 NOTE — Telephone Encounter (Signed)
Informed pt of normal pap results

## 2011-02-07 NOTE — Assessment & Plan Note (Signed)
Summary: cpe /with pap   Vital Signs:  Patient profile:   77 year old female Menstrual status:  hysterectomy Height:      63 inches (160.02 cm) Weight:      119.50 pounds (54.32 kg) O2 Sat:      100 % on Room air Temp:     97.7 degrees F (36.50 degrees C) oral Pulse rate:   68 / minute BP sitting:   166 / 72  (right arm) Cuff size:   regular  Vitals Entered By: Josph Macho RMA (January 31, 2011 11:18 AM)  O2 Flow:  Room air CC: Physical w/pap/ CF Is Patient Diabetic? No Menarche (age onset years): >18   Menses interval (days): every 35 to 60 days   History of Present Illness:  patient is a 77 year old Caucasian female in today for initial patient appointment for Pap smear. 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 he is presently on hospice rapidly. She now just being under a great deal of stress getting intermittent sleep. She believes she is tolerating it well but has taken Xanax in the past with good effect. She denies any recent illness, fevers, chills, chest pain, palpitations, shortness of breath, GU complaints. She is here today for a Pap has no history of abnormal Paps but did undergo a hysterectomy many years ago for painful this. She denies sexual activity, discharge, lesions or concerns at this time. She does have a history of silicone breast implants he did have a lump on the right side which has been monitored closely and has been deemed benign. It is not bothering her and has not recently grown. Her last colonoscopy was about 5 years ago she was told she needed only 10 years.  Preventive Screening-Counseling & Management  Caffeine-Diet-Exercise     Does Patient Exercise: no      Drug Use:  no.    Current Medications (verified): 1)  Hydrochlorothiazide 25 Mg Tabs (Hydrochlorothiazide) .... Qd 2)  Premarin 0.3 Mg Tabs (Estrogens Conjugated) .... Once Daily Prn 3)  Diltiazem Hcl Cr 120 Mg Cp12 (Diltiazem Hcl) ....  Qd 4)  Metoprolol Succinate 50 Mg Xr24h-Tab (Metoprolol Succinate) .... Take One Tablet By Mouth Daily 5)  Miralax   Powd (Polyethylene Glycol 3350) .... As Needed 6)  Aspirin 81 Mg  Tbec (Aspirin) .... Once Daily 7)  Caltrate 600+d 600-400 Mg-Unit  Tabs (Calcium Carbonate-Vitamin D) .... Two Times A Day 8)  Fish Oil 1000 Mg  Caps (Omega-3 Fatty Acids) .... Tid 9)  Vitamin D 1000 Unit  Caps (Cholecalciferol) .... Once Daily  Allergies (verified): No Known Drug Allergies  Past History:  Past Medical History: Last updated: 09/17/2010 SVT...3-4 beats... Holter... 2007 sinus tachycardia... mild... resting Fatigue with exercise... January, 2011... responded to increased beta blocker dose Dyslipidemia Anemia-iron deficiency GERD Osteoporosis EF 55%... echo... April, 2007 Nuclear stress test... November, 2004... no ischemia..  Family History: Last updated: January 31, 2011  daughter deceased ? cause Father: deceased@73 , stomach cancer, smoker Mother: deceased@95 , stroke, htn, CHF, TIA, Cervical Cancer at 35 Siblings:  Brother: deceased@76 , Lymphoma Sister: deceased@49 , brain cancer Brother: deceased@62 , MI, smoker, ETOH Sister: 16, [employer] MGM: deceased mid 52s, CHF MGF: deceased, unknown history PGM: deceased, unknown history PGF: deceased, unknown history Children: Daughter: deceased@55 , stroke?, MVP, Migraine HA, IBS Son: 28, HTN Puncles x 2 cancer P uncle, MI P Aunt, cancer  Social History: Last updated: Jan 31, 2011 Married,  husband has advanced dementia, COPD Never Smoked  Alcohol use-no Drug use-no Regular exercise-no No dietary restrictions Wears seat belt regularly  Risk Factors: Exercise: no (01/28/2011)  Risk Factors: Smoking Status: never (02/28/2010)  Past Surgical History: Hysterectomy with unilateral oophorectomy-bleeding, ovarian cysts, left ovary left in place? Breast Implants R knee arthroscopy, cleansed  Family History:  daughter deceased ?  cause Father: deceased@73 , stomach cancer, smoker Mother: deceased@95 , stroke, htn, CHF, TIA, Cervical Cancer at 60 Siblings:  Brother: deceased@76 , Lymphoma Sister: deceased@49 , brain cancer Brother: deceased@62 , MI, smoker, ETOH Sister: 63, [employer] MGM: deceased mid 42s, CHF MGF: deceased, unknown history PGM: deceased, unknown history PGF: deceased, unknown history Children: Daughter: deceased@55 , stroke?, MVP, Migraine HA, IBS Son: 67, HTN Puncles x 2 cancer P uncle, MI P Aunt, cancer  Social History: Married,  husband has advanced dementia, COPD Never Smoked Alcohol use-no Drug use-no Regular exercise-no No dietary restrictions Wears seat belt regularlyDrug Use:  no Does Patient Exercise:  no  Review of Systems  The patient denies anorexia, fever, weight loss, weight gain, vision loss, decreased hearing, hoarseness, chest pain, syncope, dyspnea on exertion, peripheral edema, prolonged cough, headaches, hemoptysis, abdominal pain, melena, hematochezia, severe indigestion/heartburn, hematuria, incontinence, genital sores, muscle weakness, suspicious skin lesions, transient blindness, difficulty walking, depression, unusual weight change, abnormal bleeding, enlarged lymph nodes, angioedema, breast masses, and testicular masses.    Physical Exam  General:  Well-developed,well-nourished,in no acute distress; alert,appropriate and cooperative throughout examination Head:  Normocephalic and atraumatic without obvious abnormalities. No apparent alopecia or balding. Eyes:  No corneal or conjunctival inflammation noted. EOMI. Perrla.  Ears:  External ear exam shows no significant lesions or deformities.  Otoscopic examination reveals clear canals, tympanic membranes are intact bilaterally without bulging, retraction, inflammation or discharge. Hearing is grossly normal bilaterally. Nose:  External nasal examination shows no deformity or inflammation. Nasal mucosa are pink and moist  without lesions or exudates. Mouth:  Oral mucosa and oropharynx without lesions or exudates.   Neck:  No deformities, masses, or tenderness noted. Chest Wall:  No deformities, masses, or tenderness noted. Breasts:  very firm implants b/l, nodule noted at 9 oclock on right breast, firm irregularly shaped, mobile Lungs:  Normal respiratory effort, chest expands symmetrically. Lungs are clear to auscultation, no crackles or wheezes. Heart:  Normal rate and regular rhythm. S1 and S2 normal without gallop, murmur, click, rub or other extra sounds. Abdomen:  Bowel sounds positive,abdomen soft and non-tender without masses, organomegaly or hernias noted. Rectal:  no external abnormalities and no hemorrhoids.   Genitalia:  Normal introitus for age, no external lesions, no vaginal discharge, mucosa pink and moist, no vaginal lesions, , no friaility or hemorrhage, no adnexal masses or tenderness Msk:  No deformity or scoliosis noted of thoracic or lumbar spine.   Pulses:  R and L carotid,radial,femoral,dorsalis pedis and posterior tibial pulses are full and equal bilaterally Extremities:  No clubbing, cyanosis, edema, or deformity noted with normal full range of motion of all joints.   Neurologic:  No cranial nerve deficits noted. Station and gait are normal. Plantar reflexes are down-going bilaterally. DTRs are symmetrical throughout. Sensory, motor and coordinative functions appear intact. Skin:  scattered seboreahic keratosis on chest and back Cervical Nodes:  left neck 1 cm firm, nt enlarged LN along SCM muscle Psych:  Cognition and judgment appear intact. Alert and cooperative with normal attention span and concentration. No apparent delusions, illusions, hallucinations   Impression & Recommendations:  Problem # 1:  SCREENING FOR MALIGNANT NEOPLASM OF THE CERVIX (ICD-V76.2) Pap taken today, MGM  not due until after 6/22  Problem # 2:  ELEVATED BP READING WITHOUT DX HYPERTENSION (ICD-796.2)  Her  updated medication list for this problem includes:    Hydrochlorothiazide 25 Mg Tabs (Hydrochlorothiazide) ..... Qd    Diltiazem Hcl Cr 120 Mg Cp12 (Diltiazem hcl) ..... Qd    Metoprolol Succinate 50 Mg Xr24h-tab (Metoprolol succinate) .Marland Kitchen... Take one tablet by mouth daily Patient on medications to control tachycardia but rushed out of the house and did not take them today, she is asked to take them prior to her next visit so we can further evaluate   Problem # 3:  SINUS TACHYCARDIA (ICD-427.89)  Her updated medication list for this problem includes:    Metoprolol Succinate 50 Mg Xr24h-tab (Metoprolol succinate) .Marland Kitchen... Take one tablet by mouth daily    Aspirin 81 Mg Tbec (Aspirin) ..... Once daily Well controlled, no changes  Problem # 4:  CHICKENPOX, HX OF (ICD-V15.9) Patient will consider a Zostavax shot at her next visit.   Problem # 5:  HYPERLIPIDEMIA (ICD-272.4) Agrees to labs prior to next visit to further evaluate.  Problem # 6:  ANEMIA, CHRONIC (ICD-281.9) Will check CBC to further evaluate  Complete Medication List: 1)  Hydrochlorothiazide 25 Mg Tabs (Hydrochlorothiazide) .... Qd 2)  Premarin 0.3 Mg Tabs (Estrogens conjugated) .... Once daily prn 3)  Diltiazem Hcl Cr 120 Mg Cp12 (Diltiazem hcl) .... Qd 4)  Metoprolol Succinate 50 Mg Xr24h-tab (Metoprolol succinate) .... Take one tablet by mouth daily 5)  Miralax Powd (Polyethylene glycol 3350) .... As needed 6)  Aspirin 81 Mg Tbec (Aspirin) .... Once daily 7)  Caltrate 600+d 600-400 Mg-unit Tabs (Calcium carbonate-vitamin d) .... Two times a day 8)  Fish Oil 1000 Mg Caps (Omega-3 fatty acids) .... Tid 9)  Vitamin D 1000 Unit Caps (Cholecalciferol) .... Once daily 10)  Alprazolam 0.5 Mg Tabs (Alprazolam) .... 1/2 to 2  tabs by mouth two times a day as needed anxiety, insomnia  Patient Instructions: 1)  Please schedule a follow-up appointment in 1 to 2  months 2)  Try Kegel exercises sets of 10 twice daily as directed 3)   Add Benefiber powder 2 tsp in a beverage or food daily 4)  Eat a yogurt or take a probiotic such as Align caps daily to help with constipation. 5)  Drink 64 oz of clear fluids daily to help with constipation Prescriptions: ALPRAZOLAM 0.5 MG TABS (ALPRAZOLAM) 1/2 to 2  tabs by mouth two times a day as needed anxiety, insomnia  #40 x 1   Entered and Authorized by:   Danise Edge MD   Signed by:   Danise Edge MD on 01/28/2011   Method used:   Print then Give to Patient   RxID:   [id number]    Orders Added: 1)  Est. Patient Level IV [78469]

## 2011-03-05 ENCOUNTER — Encounter: Payer: Self-pay | Admitting: Family Medicine

## 2011-03-05 ENCOUNTER — Ambulatory Visit (INDEPENDENT_AMBULATORY_CARE_PROVIDER_SITE_OTHER): Payer: Medicare Other | Admitting: Family Medicine

## 2011-03-05 DIAGNOSIS — R232 Flushing: Secondary | ICD-10-CM

## 2011-03-05 DIAGNOSIS — H6190 Disorder of external ear, unspecified, unspecified ear: Secondary | ICD-10-CM

## 2011-03-05 DIAGNOSIS — F411 Generalized anxiety disorder: Secondary | ICD-10-CM

## 2011-03-05 DIAGNOSIS — D539 Nutritional anemia, unspecified: Secondary | ICD-10-CM

## 2011-03-05 DIAGNOSIS — M810 Age-related osteoporosis without current pathological fracture: Secondary | ICD-10-CM

## 2011-03-05 DIAGNOSIS — F419 Anxiety disorder, unspecified: Secondary | ICD-10-CM

## 2011-03-05 DIAGNOSIS — K5900 Constipation, unspecified: Secondary | ICD-10-CM

## 2011-03-05 DIAGNOSIS — R030 Elevated blood-pressure reading, without diagnosis of hypertension: Secondary | ICD-10-CM

## 2011-03-05 DIAGNOSIS — E785 Hyperlipidemia, unspecified: Secondary | ICD-10-CM

## 2011-03-05 DIAGNOSIS — Z780 Asymptomatic menopausal state: Secondary | ICD-10-CM

## 2011-03-05 DIAGNOSIS — I10 Essential (primary) hypertension: Secondary | ICD-10-CM

## 2011-03-05 DIAGNOSIS — K219 Gastro-esophageal reflux disease without esophagitis: Secondary | ICD-10-CM

## 2011-03-05 LAB — HEPATIC FUNCTION PANEL
ALT: 14 U/L (ref 0–35)
AST: 19 U/L (ref 0–37)
Albumin: 4 g/dL (ref 3.5–5.2)
Total Protein: 7.6 g/dL (ref 6.0–8.3)

## 2011-03-05 LAB — CBC WITH DIFFERENTIAL/PLATELET
Eosinophils Relative: 1.9 % (ref 0.0–5.0)
HCT: 33.4 % — ABNORMAL LOW (ref 36.0–46.0)
Hemoglobin: 11.5 g/dL — ABNORMAL LOW (ref 12.0–15.0)
Lymphs Abs: 2.5 10*3/uL (ref 0.7–4.0)
MCV: 87.9 fl (ref 78.0–100.0)
Monocytes Absolute: 0.4 10*3/uL (ref 0.1–1.0)
Monocytes Relative: 6.9 % (ref 3.0–12.0)
Neutro Abs: 2.8 10*3/uL (ref 1.4–7.7)
RDW: 13.1 % (ref 11.5–14.6)
WBC: 5.9 10*3/uL (ref 4.5–10.5)

## 2011-03-05 LAB — RENAL FUNCTION PANEL
BUN: 17 mg/dL (ref 6–23)
CO2: 33 mEq/L — ABNORMAL HIGH (ref 19–32)
Calcium: 10.5 mg/dL (ref 8.4–10.5)
Chloride: 99 mEq/L (ref 96–112)
Creatinine, Ser: 0.9 mg/dL (ref 0.4–1.2)
GFR: 66.5 mL/min (ref >60.00)

## 2011-03-05 LAB — TSH: TSH: 2.27 u[IU]/mL (ref 0.35–5.50)

## 2011-03-05 LAB — LIPID PANEL
Cholesterol: 236 mg/dL — ABNORMAL HIGH (ref 0–200)
Total CHOL/HDL Ratio: 3
Triglycerides: 165 mg/dL — ABNORMAL HIGH (ref 0.0–149.0)

## 2011-03-05 LAB — LDL CHOLESTEROL, DIRECT: Direct LDL: 120.7 mg/dL

## 2011-03-05 MED ORDER — VENLAFAXINE HCL ER 37.5 MG PO CP24: 37.5000 mg | ORAL_CAPSULE | Freq: Every day | ORAL | Status: DC

## 2011-03-05 NOTE — Patient Instructions (Signed)
Anemia and Aging   Hemoglobin is the protein in your blood that carries oxygen. If this protein becomes too low, you are anemic. Usually mild anemia (low red blood cells) may not be noticeable. However, if it becomes more severe, you may feel tired, short of breath with activity, and may experience fainting or palpitations (heart beats you can feel). As many as 61% of elderly people have anemia. This depends on age, sex, and overall health. The chance of becoming anemic increases after age 65 and is greater in men.  CAUSES  There are many causes of anemia. Studies have shown the 2 major causes of anemia in the elderly are:    Chronic (long standing) disease.    Anemia of chronic disease: Certain chronic diseases can interfere with the production of red blood cells, resulting in chronic anemia. The kidneys produce a hormone called erythropoietin which stimulates your bone marrow to produce red blood cells. Bone marrow is the soft, spongy tissue found in bone cavities which makes and stores most of the red blood cells.    Iron is also needed for red blood cell production. In anemia of chronic disease a body can not use its stored iron. Erythropoietin is suppressed (held down from working) and the bone marrow does not respond normally. A shortage of iron and erythropoietin can result in a shortage of red blood cells.    Iron deficiency.    Iron deficiency anemia can result from chronic, often undetected blood loss. Another cause is from not eating enough iron in your diet, or when your body is not absorbing enough iron. About 20% to 36% of elderly people have idiopathic anemia. This means no cause for the anemia can be found.   EFFECTS OF UNTREATED ANEMIA  Elderly people with anemia are 40% more likely to have problems that keep them from being independent. Some of these problems include:   Poor balance.    Falling more often.    Heart trouble.    Depression.    Problems with memory and concentration.    Not  being able to walk long distances.   Anemia has been shown to shorten the life expectancy of elderly people. As early as age 65, people with kidney disease, diabetes, and/or heart failure are more likely to die if they have anemia. Anemia can also make certain medical conditions worse. Getting treatment for anemia is important. This does not guarantee you will live longer if the anemia is corrected, but it this information shows how important it is to treat your anemia.   SYMPTOMS  Most people do not realize they are anemic until a blood test is done. Symptoms and signs usually develop when anemia is moderate to severe. Symptoms can include:   Fatigue.   Weakness.    Pale skin.    Chest pain.    Dizziness.   Irritability.   Trouble breathing.    A fast heartbeat.    Headaches.    Numbness or coldness in your hands and feet.    One of the problems in identifying anemia in the elderly is that many of these symptoms are typically associated with getting older. It is important to see your caregiver on a regular basis in order to be tested for anemia.   TREATMENT  Your caregiver will provide the treatment that is best for you based on what is causing the anemia. Several medications are approved to help correct anemia. Close communication with your caregiver will help you   to receive the best anemia treatment available.   Normal Lab Values:   Normal hemoglobin is greater than 12 g/dL for women, with a hematocrit which is greater than 37%.  Normal hemoglobin is greater than 14 g/dL for men, with a normal hematocrit which is greater than 42%.  Document Released: 06/05/2004 Document Re-Released: 04/17/2010  [employer] Patient Information 2011 [employer].

## 2011-03-06 ENCOUNTER — Encounter: Payer: Self-pay | Admitting: Family Medicine

## 2011-03-06 ENCOUNTER — Other Ambulatory Visit: Payer: Self-pay

## 2011-03-06 DIAGNOSIS — R232 Flushing: Secondary | ICD-10-CM

## 2011-03-06 DIAGNOSIS — I10 Essential (primary) hypertension: Secondary | ICD-10-CM

## 2011-03-06 DIAGNOSIS — F419 Anxiety disorder, unspecified: Secondary | ICD-10-CM

## 2011-03-06 DIAGNOSIS — H6190 Disorder of external ear, unspecified, unspecified ear: Secondary | ICD-10-CM | POA: Insufficient documentation

## 2011-03-06 DIAGNOSIS — Z780 Asymptomatic menopausal state: Secondary | ICD-10-CM | POA: Insufficient documentation

## 2011-03-06 HISTORY — DX: Disorder of external ear, unspecified, unspecified ear: H61.90

## 2011-03-06 HISTORY — DX: Essential (primary) hypertension: I10

## 2011-03-06 MED ORDER — VENLAFAXINE HCL ER 37.5 MG PO CP24: 37.5000 mg | ORAL_CAPSULE | Freq: Every day | ORAL | Status: DC

## 2011-03-06 NOTE — Assessment & Plan Note (Signed)
Lesion is scaly and has been present for more than a month, will refer to dermatology for further evaluation of lesion at this time

## 2011-03-06 NOTE — Assessment & Plan Note (Signed)
Avoid trans fats, continue fish oil and add a fiber supplement

## 2011-03-06 NOTE — Assessment & Plan Note (Signed)
Bowels moving better with Miralax but is encouraged to add a fiber supplement

## 2011-03-06 NOTE — Progress Notes (Signed)
Karina [name] [id number] December 17, 1932 03/06/2011      Progress Note-Follow Up  Subjective  Chief Complaint  Chief Complaint  Patient presents with  . Follow-up    2 month follow up/ labs     HPI  Patient is a 28 Caucasian female in today for evaluation of multiple concerns. She continues to be under a great deal of stress secondary to her husband's worsening dementia. At her first visit he was in rehabilitation slightly more relaxed and is back home again and his condition gets worse she gets very easily and educated in at this time is her having an affair and getting angry. She gets tearful during the visit he acknowledges she is under great stress. She is sleeping poorly and eating intermittently. She would like to come off of her hormone therapy, Premarin. She does try to come off in the past but when she gets past 3 days but no medicine the hot flashes return of an intolerable level so she restarted. She is taking alprazolam infrequently but it is helpful when she moves it in to denies any recent illness, fevers, chills, chest pain, palpitations, shortness of breath, GI or GU complaints. Her bowels are moving relatively well at this time. He does report a new scaly lesion on the top of her left pinna which has been present for some where around a month and unchanged. Not pruritic or bleeding.  Past Medical History  Diagnosis Date  . SVT (supraventricular tachycardia) 2007    3-4 beats.. Holter  . Sinus tachycardia     mild-- retesting  . Fatigue 1/11    w/ exercise.. responded to increased beta blocker dose  . Dyslipidemia   . Anemia     iron deficiency  . GERD (gastroesophageal reflux disease)   . Osteoporosis   . Other specified cardiac dysrhythmias 09/17/2010  . OSTEOPOROSIS 08/13/2006  . MEASLES, HX OF 01/28/2011  . HYPERLIPIDEMIA 12/17/2007  . GERD 12/28/2007  . HTN (hypertension) 03/06/2011  . DIVERTICULOSIS, COLON 01/28/2011  . CONSTIPATION 01/28/2011  . CHICKENPOX, HX OF  01/28/2011  . ANEMIA, CHRONIC 08/13/2006  . Post menopausal problems 03/06/2011  . Lesion of external ear 03/06/2011    Past Surgical History  Procedure Date  . Abdominal hysterectomy     w/ unilateral oophorectomy-bleeding, ovarian cysts, left ovary left in place?  . Breast surgery     Implants  . Right knee arthroscopy, cleaned     Family History  Problem Relation Age of Onset  . Stroke Mother   . Hypertension Mother   . Other Mother     CHF  . Transient ischemic attack Mother   . Cancer Mother 40    cervical  . Cancer Father     stomach, smoker  . Cancer Sister     brain  . Lymphoma Brother   . Stroke Daughter     ? stroke  . Mitral valve prolapse Daughter   . Irritable bowel syndrome Daughter   . Migraines Daughter   . Hypertension Son   . Cancer Paternal Aunt     cancer  . Cancer Paternal Uncle     X 2 CA  . Heart attack Paternal Uncle   . Other Maternal Grandmother     CHF  . Alcohol abuse Brother   . Heart attack Brother     smoker    History   Social History  . Marital Status: Married    Spouse Name: N/A    Number of  Children: N/A  . Years of Education: N/A   Occupational History  . Not on file.   Social History Main Topics  . Smoking status: Never Smoker   . Smokeless tobacco: Never Used  . Alcohol Use: No  . Drug Use: No  . Sexually Active: No   Other Topics Concern  . Not on file   Social History Narrative  . No narrative on file    Current Outpatient Prescriptions on File Prior to Visit  Medication Sig Dispense Refill  . ALPRAZolam (XANAX) 0.5 MG tablet Take 0.5 mg by mouth as needed. 1/2 to 2 tabs po bid prn for anxiety, insomnia       . aspirin 81 MG tablet Take 81 mg by mouth daily.        . Calcium Carbonate-Vitamin D (CALTRATE 600+D) 600-400 MG-UNIT per tablet Take 1 tablet by mouth 2 (two) times daily.        . Cholecalciferol (VITAMIN D) 1000 UNITS capsule Take 1,000 Units by mouth daily.        Marland Kitchen diltiazem (CARDIZEM) 120 MG  tablet Take 120 mg by mouth daily.        Marland Kitchen estrogens, conjugated, (PREMARIN) 0.3 MG tablet Take 0.3 mg by mouth as needed.        . fish oil-omega-3 fatty acids 1000 MG capsule Take 2 g by mouth 3 (three) times daily.        . hydrochlorothiazide 25 MG tablet Take 25 mg by mouth daily.        . metoprolol (TOPROL-XL) 50 MG 24 hr tablet Take 50 mg by mouth daily.        . polyethylene glycol powder (MIRALAX) powder Take 17 g by mouth daily.          No Known Allergies  Review of Systems  Review of Systems  Constitutional: Negative for fever and malaise/fatigue.  HENT: Negative for congestion.   Eyes: Negative for discharge.  Respiratory: Negative for shortness of breath.   Cardiovascular: Negative for chest pain, palpitations and leg swelling.  Gastrointestinal: Negative for nausea, abdominal pain and diarrhea.  Genitourinary: Negative for dysuria.  Musculoskeletal: Negative for falls.  Skin: Negative for rash.       [Lesion on top of left pinna>1 month Neurological: Negative for loss of consciousness and headaches.  Endo/Heme/Allergies: Negative for polydipsia.  Psychiatric/Behavioral: Negative for depression and suicidal ideas. The patient is nervous/anxious and has insomnia.     Objective  BP 136/81  Pulse 68  Temp(Src) 97.8 F (36.6 C) (Oral)  Ht 5\' 3"  (1.6 m)  Wt 129 lb (58.514 kg)  BMI 22.85 kg/m2  SpO2 99%  Physical Exam  Physical Exam  Constitutional: She is oriented to person, place, and time and well-developed, well-nourished, and in no distress. No distress.  HENT:  Head: Normocephalic and atraumatic.  Eyes: Conjunctivae are normal.  Neck: Neck supple. No thyromegaly present.  Cardiovascular: Normal rate, regular rhythm and normal heart sounds.   No murmur heard. Pulmonary/Chest: Effort normal and breath sounds normal. She has no wheezes.  Abdominal: She exhibits no distension and no mass.  Musculoskeletal: She exhibits no edema.  Lymphadenopathy:     She has no cervical adenopathy.  Neurological: She is alert and oriented to person, place, and time.  Skin: Skin is warm and dry. No rash noted. She is not diaphoretic.  Psychiatric: Memory, affect and judgment normal.    Lab Results  Component Value Date   TSH 2.27 03/05/2011  Lab Results  Component Value Date   WBC 5.9 03/05/2011   HGB 11.5* 03/05/2011   HCT 33.4* 03/05/2011   MCV 87.9 03/05/2011   PLT 204.0 03/05/2011   Lab Results  Component Value Date   CREATININE 0.9 03/05/2011   BUN 17 03/05/2011   NA 140 03/05/2011   K 3.6 03/05/2011   CL 99 03/05/2011   CO2 33* 03/05/2011   Lab Results  Component Value Date   ALT 14 03/05/2011   AST 19 03/05/2011   ALKPHOS 56 03/05/2011   BILITOT 0.5 03/05/2011   Lab Results  Component Value Date   CHOL 236* 03/05/2011   Lab Results  Component Value Date   HDL 85.10 03/05/2011   No results found for this basename: [facility]   Lab Results  Component Value Date   TRIG 165.0* 03/05/2011   Lab Results  Component Value Date   CHOLHDL 3 03/05/2011     Assessment & Plan  OSTEOPOROSIS Continue Vitamin D and Calcium supplements and monitor with Dexa scans at intervals  HYPERLIPIDEMIA Avoid trans fats, continue fish oil and add a fiber supplement   GERD No c/o today. Avoid offending foods  HTN (hypertension) Well controlled on current meds no changes, avoid sodium  CONSTIPATION Bowels moving better with Miralax but is encouraged to add a fiber supplement  ANEMIA, CHRONIC Will repeat a cbc and monitor, encouraged leafy greens and lean red meat  Lesion of external ear Lesion is scaly and has been present for more than a month, will refer to dermatology for further evaluation of lesion at this time  Post menopausal problems Patient reports trying to wean off her Premarin off and on for years but whenever she goes more than 3 days without a dose her hot flashes become intolerable. She is also under a great deal of stress and  struggling with  Hi anxiety due to her husbands worsening dementia. She is his sole care provider and is feeling very stressed, we will start Venlafaxine and titrate up as tolerated. Stop the Premarin next week.

## 2011-03-06 NOTE — Assessment & Plan Note (Signed)
Patient reports trying to wean off her Premarin off and on for years but whenever she goes more than 3 days without a dose her hot flashes become intolerable. She is also under a great deal of stress and struggling with  Hi anxiety due to her husbands worsening dementia. She is his sole care provider and is feeling very stressed, we will start Venlafaxine and titrate up as tolerated. Stop the Premarin next week.

## 2011-03-06 NOTE — Assessment & Plan Note (Signed)
Will repeat a cbc and monitor, encouraged leafy greens and lean red meat

## 2011-03-06 NOTE — Assessment & Plan Note (Signed)
Continue Vitamin D and Calcium supplements and monitor with Dexa scans at intervals

## 2011-03-06 NOTE — Assessment & Plan Note (Signed)
Well controlled on current meds no changes, avoid sodium

## 2011-03-06 NOTE — Assessment & Plan Note (Signed)
No c/o today. Avoid offending foods

## 2011-03-26 NOTE — Assessment & Plan Note (Signed)
[facility]                            CARDIOLOGY OFFICE NOTE   Karina [name], Karina [name]                      MRN:          [id number]  DATE:03/19/2007                            DOB:          05-30-1933    Karina [name] is seen for cardiology followup. She has some  supraventricular tachycardia and also a resting heart rate increase. We  decided to try her with both diltiazem and a beta blocker over time and  I have chosen to keep her on both. She continues to do very well. She  has not had any syncope or pre syncope and she has had no marked  palpitations.   PAST MEDICAL HISTORY:   ALLERGIES:  No known drug allergies.   MEDICATIONS:  1. Premarin.  2. Aspirin.  3. Fish oil.  4. Hydrochlorothiazide,  5. Diltiazem 120.  6. Metoprolol 25.  7. Famotidine.  8. Pepcid.   OTHER MEDICAL PROBLEMS:  See the list below.   REVIEW OF SYSTEMS:  She feels well and review of systems is negative.   PHYSICAL EXAMINATION:  Weight is 121 pounds, blood pressure 124/72 ,with  a pulse of 64. The patient is oriented to person, time, and place.  Affect is normal.  She has no xanthelasma. There is normal extraocular motion. Conjunctivae  are normal.  She has no carotid bruits. There is no jugular venous distension.  LUNGS: Clear.  Respiratory effort is not labored.  CARDIAC EXAM: Reveals an S1, with an S2. There are no clicks or  significant murmurs.  ABDOMEN: Soft. There is normal bowel sounds.  She has no peripheral edema.  There are no musculoskeletal deformities.   No labs are done today.   PROBLEMS:  Include;  1. History of good left ventricular function.  2. History of palpitations with some supraventricular tachycardia in      the past and some resting sinus tachycardia. This is now well      treated on low dose diltiazem and beta blocker and theses will be      continued.  3. Status post  hysterectomy and knee surgery.   Patient is stable. I will see  her back in 9 months for follow up.     Luis Abed, MD, [facility]  Electronically Signed    JDK/MedQ  DD: 03/19/2007  DT: 03/19/2007  Job #: [id number]   cc:   Valetta Mole. Swords, MD

## 2011-03-26 NOTE — Discharge Summary (Signed)
NAMEROWENA, MOILANEN               ACCOUNT NO.:  [id number]   MEDICAL RECORD NO.:  [id number]          PATIENT TYPE:  INP   LOCATION:  6734                         FACILITY:  [facility]   PHYSICIAN:  Wilmon Arms. Corliss Skains, M.D. DATE OF BIRTH:  [birth year]   DATE OF ADMISSION:  02/03/2008  DATE OF DISCHARGE:  02/06/2008                               DISCHARGE SUMMARY   DISCHARGING PHYSICIAN:  Wilmon Arms. Tsuei, M.D.   CHIEF COMPLAINT/REASON FOR ADMISSION:  Ms. Karina [name] is a 77 year old  female patient who presented to the ER with 2-3 days worth of abdominal  pain, began as abdominal cramping; 24 hours prior to admission, she had  developed nausea and vomiting, which persisted after she presented to  the ER.  She does have a surgical history of hysterectomy.  CT scan was  done in the ER that demonstrated proximal small bowel dilatation, and  lack of flow of contrast beyond a certain point, but no definite  transition point noted.  Because of the dilated bowel, she is felt to  have partial proximal small bowel obstruction.  Surgical consultation  was requested.   On exam, ER vital signs were normal.  Her abdominal exam revealed a  soft, mildly distended, nontender abdomen without palpable masses.  She  had bowel sounds noted.  The patient denies passage of bowel movement or  flatus since arrival.   The patient's white count was normal at 8900, hemoglobin was 12.9.  LFTs  and pancreatic enzymes are normal.   ADMITTING DIAGNOSES:  1. Early partial proximal small bowel obstruction.  2. The patient with history of hysterectomy.   HOSPITAL COURSE:  The patient was admitted to the hospital where she was  placed on bowel rest with NG tube for decompression, IV fluids and  continued monitoring.  More than 24 hours after admission, the patient  still had a normal white count.  She had no further nausea, and had a  bowel movement yesterday that makes a soft and hard stool, and review of  her plain  films. It was noted she had significant amount of obstipation  in the colon area.  The patient chronically takes diuretics at home for  hypertension.  CT also demonstrated colonic feces material.  The patient  subsequently started on Dulcolax suppository, and the hypokalemia was  treated effectively.  The patient's NG tube was clamped and then  discontinued, and by hospital day 2, she was started on a clear diet.  Her CT also showed a large left adnexal cyst measuring 3.6 x 6.4 cm.  The patient is postmenopausal, so was opted that the patient undergo  intravaginal ultrasound to ensure this was not a malignant process.  This revealed a left simple cyst in the adnexa without any suspicious  abnormality.  Over the next several days, the patient's diet was  progressed.  She began having bowel movements.  She had been started on  MiraLax by date of discharge 02/06/08.  The patient was tolerating  regular diet.  White count 5200, hemoglobin 10.4.  Abdomen soft and  nontender with active bowel sounds.  FINAL DISCHARGE DIAGNOSIS:  Partial small-bowel obstruction secondary to  constipation.   DISCHARGE MEDICATIONS:  The patient will resume her usual home  medications.  In addition, she will start over-the-counter MiraLax  p.r.n. to aid in bowel movements.   DIET:  No restrictions.  Wound care none.   ACTIVITY:  No restrictions.   FOLLOW UP:  She is to call Dr. Tenna Child office to be seen in 2 weeks,  [phone].      Allison L. Rondel Jumbo. Tsuei, M.D.  Electronically Signed    ALE/MEDQ  D:  02/26/2008  T:  02/27/2008  Job:  [id number]   cc:   Luis Abed, MD, [facility]  Currie Paris, M.D.  Wilmon Arms. Tsuei, M.D.

## 2011-03-26 NOTE — Assessment & Plan Note (Signed)
[facility]                            CARDIOLOGY OFFICE NOTE   DEVRA, STARE                      MRN:          [id number]  DATE:12/21/2007                            DOB:          February 12, 1933    Ms. Barcellos is followed for palpitations.  We have kept her on a low  dose of diltiazem and a beta blocker.  She has had some supraventricular  tachycardia in the past, and she also has some mild increased resting  heart rate.  Both of these have been stable.  If she has significant  increased stress, she may have some increased symptoms.  She has had no  chest pain or shortness of breath.  She has had no syncope or  presyncope.   ALLERGIES:  NO KNOWN DRUG ALLERGIES.   MEDICATIONS:  Premarin, aspirin, calcium, fish oil, hydrochlorothiazide,  metoprolol 25, Pepcid, and diltiazem CD 120.   OTHER MEDICAL PROBLEMS:  See the list below.   REVIEW OF SYSTEMS:  She is fine at this time, and her review of systems  otherwise is negative.   PHYSICAL EXAMINATION:  VITAL SIGNS:  Blood pressure is 130/70 with a  pulse of 71.  GENERAL:  The patient is oriented to person, time, and place.  Affect is  normal.  HEENT:  No xanthelasma.  She has normal extraocular motion.  NECK:  There are no carotid bruits.  There is no jugular venous  distention.  LUNGS:  Clear.  Respiratory effort is not labored.  CARDIAC:  S1 and S2.  There are no clicks or significant murmurs.  ABDOMEN:  Soft.  EXTREMITIES:  There is no peripheral edema.   EKG reveals sinus rhythm.   PROBLEMS:  1. History of good left ventricular function.  2. History of palpitations with some supraventricular tachycardia and      some mild resting sinus tachycardia.  She is on both diltiazem and      a beta blocker, and she is stable.  3.  Status post hysterectomy      and knee surgery.   The patient's cardiac status is stable.  She needs no change in her  meds.  It is noted that there is a lab drawn  by Dr. Cato Mulligan.  I have  received a copy of direct LDL.  The rest of the lipid profile  is not seen.  Her LDL is 159.  I encourage her to speak with Dr. Cato Mulligan  about the approach to this.     Luis Abed, MD, [facility]  Electronically Signed    JDK/MedQ  DD: 12/21/2007  DT: 12/22/2007  Job #: [id number]   cc:   Valetta Mole. Swords, MD

## 2011-03-26 NOTE — H&P (Signed)
[name], [name] NO.:  [id number]   MEDICAL RECORD NO.:  [id number]          PATIENT TYPE:  INP   LOCATION:  6734                         FACILITY:  [facility]   PHYSICIAN:  Cherylynn Ridges, M.D.    DATE OF BIRTH:  06-Aug-1933   DATE OF ADMISSION:  02/03/2008  DATE OF DISCHARGE:                              HISTORY & PHYSICAL   CHIEF COMPLAINT:  The patient is a 77 year old with abdominal pain and  CT scan suggesting of small bowel obstruction who is admitted for IV  hydration and decompression.   HISTORY OF PRESENT ILLNESS:  The only past surgical history the patient  has is of a hysterectomy done years ago by Dr. Elaina Hoops.  Her current  process started 2 to 3 days ago with some abdominal cramping.  Yesterday  she had a couple episodes of nausea and vomiting at home which persisted  until she came into the emergency room where she also vomited.  She had  a CT scan done which showed some proximal small bowel dilatation and  lack of flow of contrast beyond a certain point, but no true transition  point noted.  Because of the dilated bowel, she is thought to have a  partial proximal small bowel obstruction and a surgical consultation was  obtained.   The patient reports having multiple episodes of bilious vomiting of  large volumes.  This has never occurred before.  She would have cramping  pain.  This has subsided recently, because, again, of being given a  small amount of morphine on the floor here.   PAST MEDICAL HISTORY:  Significant for irregular heart rate, treated by  Dr. Myrtis Ser, and a question of hypertension.   PAST SURGICAL HISTORY:  She has had a total abdominal hysterectomy.   MEDICATIONS:  1. Baby aspirin once a day.  2. Calcium with vitamin D 600 mg in the a.m. and p.m.  3. Pepcid 20 mg a day.  4. Fish oil 1 g 3 times a day.  5. Fortical spray 200 cc 1 spray daily.  6. Hydrochlorothiazide 25 mg a day.  7. Premarin 0.3 mg once a day.  8. Slow iron  47.5 mg once a day.  9. Vitamin D 1000 units once a day.  10.Diltiazem CD 120 mg once a day.  11.Metoprolol 25 mg daily.  12.Vision formula with lutein and antioxidants and zinc 1 tablet a      day.   She has no known drug allergies.   FAMILY HISTORY:  Noncontributory.   REVIEW OF SYSTEMS:  She had a normal bowel movement yesterday afternoon  prior to onset of her severe abdominal pain.  She has had nausea and  vomiting, no diarrhea.   PHYSICAL EXAMINATION:  On exam today, her vital signs are pending.  The  ones in the emergency room were normal.  She is normocephalic and atraumatic.  She is very hard of hearing but  does not wear a hearing aid.  Her neck was supple with no palpable masses.  She looks younger than her stated age.  CARDIAC EXAM:  Regular  rhythm and rate, but no murmurs, gallops, rubs,  or heaves.  Her lungs are clear to auscultation.  Her abdomen is soft, mildly distended with no tenderness, no palpable  masses.  She has good bowel sounds, she has passed no gas or had no  bowel movements since she has been here.  RECTAL EXAM:  Was not performed.  CARDIOVASCULAR:  She has good normal femoral pulses and popliteal  pulses, no evidence of ischemia or cyanosis.  NEUROLOGICALLY:  Cranial nerves II-XII are grossly intact.   LABORATORY STUDIES:  Hemoglobin is 12.9, hematocrit 37, white count is  normal at 8.9.  She has a slightly low potassium that was done at 11  o'clock last night at 3.3.  Liver function tests are normal, lipase is  normal.   IMPRESSION:  Possible early partial proximal small bowel obstruction.  It would have to be from adhesions from prior surgery; however, most of  the scarring from that would be in her pelvis, and to get a proximal  obstruction is less common.   PLAN:  Just allow her to get better without necessarily nasogastric tube  decompression; however, if she should continue to have large volumes of  bilious vomiting, then a nasogastric  tube will be necessary.  We will  rehydrate her with D5LR, recheck the labs, and also recheck the  abdominal films tomorrow morning.  Currently, I do not feel as though  she will require surgical intervention.      Cherylynn Ridges, M.D.  Electronically Signed     JOW/MEDQ  D:  02/04/2008  T:  02/04/2008  Job:  [id number]

## 2011-03-26 NOTE — Assessment & Plan Note (Signed)
[facility]                            CARDIOLOGY OFFICE NOTE   Karina [name], Karina [name]                      MRN:          [id number]  DATE:12/27/2008                            DOB:          1933/10/29    Karina [name] is seen for cardiology followup.  She has a history of  palpitations.  She is on low-dose diltiazem.  She has not had  significant palpitations.  She has a new problem today, however.  She  feels a tingling sensation in the left side of her neck and some brief  chest discomfort.  It is bleeding.  It does not seem to be compatible  with significant angina, but it will have to be kept in mind.   PAST MEDICAL HISTORY:   ALLERGIES:  No known drug allergies.   MEDICATIONS:  Diltiazem, calcium, and fish oil.  Also, aspirin,  Premarin, hydrochlorothiazide, metoprolol, vitamin D, Pepcid.   OTHER MEDICAL PROBLEMS:  See the list below.   REVIEW OF SYSTEMS:  She is not having any GI or GU symptoms.  She has no  fevers or chills.  There are no skin rashes.  There are no headaches or  dental problems.  Her review of systems otherwise is negative.   PHYSICAL EXAMINATION:  VITAL SIGNS:  Blood pressure is 136/70 with a  pulse of 70.  GENERAL:  The patient is oriented to person, time, and place.  Affect is  normal.  Weight is 122 pounds.  HEENT:  Reveals no xanthelasma.  She has normal extraocular motion.  There no carotid bruits.  There is no jugular venous distention.  LUNGS:  Clear.  Respiratory effort not labored.  CARDIAC:  Reveals S1 with an S2.  There are no clicks or significant  murmurs.  ABDOMEN:  Soft.  EXTREMITIES:  She has no peripheral edema.   EKG is normal.   PROBLEMS INCLUDE:  1. History of good left ventricular function.  2. History of palpitations with some supraventricular tachycardia in      the past and some mild resting sinus tachycardia.  On low-dose beta-      blocker and diltiazem, she is quite stable.  3.  Low-density lipoprotein in the past of 159.  I have encouraged her      in the past to speak with Dr. Cato [name] about possible use of a      statin.  She will be seeing him in the near future.  4. Tingling in the left neck and some chest discomfort.  At this      point, I am not convinced that this represents ischemia.  I have      chosen not to change her meds and not to do any further      testing.  However, I plan to see her back in 3 months for further      followup and to be sure that she is stable.  If this symptom      progresses, she will be in touch.     Karina Abed, MD, [facility]  Electronically Signed  JDK/MedQ  DD: 12/27/2008  DT: 12/28/2008  Job #: [id number]   cc:   Karina Mole. Swords, MD

## 2011-03-29 NOTE — Assessment & Plan Note (Signed)
[facility]                              CARDIOLOGY OFFICE NOTE   Karina, NELLUMS                      MRN:          [id number]  DATE:09/29/2006                            DOB:          03/03/1933    Karina [name] is seen for followup. See my note of August 20, 2006. We have  adjusted her medicines for bursts of palpitations that we think are short  bursts of supraventricular tachycardia seen on Holter. She is on low-does  metoprolol for a resting heart rate that is slightly increased and low dose  diltiazem for her SVT. She is feeling very well with this and we will keep  her on the same medicines.   PAST MEDICAL HISTORY:   ALLERGIES:  No known drug allergies.   MEDICATIONS:  1. Premarin.  2. Aspirin.  3. Fish oil.  4. Hydrochlorothiazide 25.  5. Diltiazem 120.  6. Metoprolol ER 25.  7. Famotidine.   OTHER MEDICAL PROBLEMS:  See the list below.   REVIEW OF SYSTEMS:  She really is feeling better and her review of systems  is negative.   PHYSICAL EXAMINATION:  VITAL SIGNS:  Weight is 117. Blood pressure 122/70  with a pulse of 75.  GENERAL:  The patient is oriented to person, time and place. Her affect is  normal.  LUNGS:  Her respiratory effort is not labored. Lungs are clear.  NECK:  Carotids reveal no bruits. There is no jugular venous distention.  HEENT:  She has no xanthelasma. She has normal extraocular motion.  CARDIAC:  Reveals an S1 with an S2. There are no clicks or significant  murmurs.  ABDOMEN:  Soft. She has no peripheral edema.   EKG done today shows that she remains in sinus rhythm.   PROBLEM LIST:  1. History of LV function.  2. Palpitations.  3. Short bursts of supraventricular tachycardia.  4. Mild increased resting heart rate.  5. Status post hysterectomy.  6. Status post knee surgery.   I explained to the patient why it would be good to remain on the low-dose  beta blocker and diltiazem. She seems to  understand and she is stable with  this. I will see her back in 6 months.     Luis Abed, MD, [facility]  Electronically Signed    JDK/MedQ  DD: 09/29/2006  DT: 09/29/2006  Job #: [id number]   cc:   Karina Mole. Swords, MD

## 2011-03-29 NOTE — Assessment & Plan Note (Signed)
[facility]                              CARDIOLOGY OFFICE NOTE   Shima, Compere OANH DEVIVO                      MRN:          [id number]  DATE:08/20/2006                            DOB:          09-13-1933    HISTORY:  I saw Ms. Camplin back on August 07, 2006.  We had added low-  dose diltiazem.  We thought that it  may have helped her with her bursts  palpitations, but she had a slightly elevated resting heart rate and noted  increased heart rate when walking up stairs.  We decided to add low dose of  a beta blocker.   Today she returns and she is definitely feeling better.  I am not sure if it  is all due to the addition of the beta blocker or the combination of the  medicines.  She feels that her heart is not racing as much and that she does  not feel the pounding sensation as much.   ALLERGIES:  No known drug allergies.   MEDICATIONS:  Premarin, aspirin, fish oil, famotidine, hydrochlorothiazide,  diltiazem 120 mg, and metoprolol ER 25 mg.   PAST MEDICAL HISTORY:  For other medical problems please see the list on my  note of August 07, 2006.   REVIEW OF SYSTEMS:  The patient has had some mild headaches.  This is new  since the metoprolol.  We will keep all of her medicines stable at this  point to see how she feels for the next six to eight weeks.  Otherwise her  review of systems is negative.   PHYSICAL EXAMINATION:  GENERAL APPEARANCE:  The patient is quite stable.  She is oriented to person, time and place; and, her affect is normal.  VITAL SIGNS:  Blood pressure is 132/77 with a pulse of 61.  Her weight is  115 pounds.  HEENT:  The head, eyes, ears, nose and throat reveal no xanthelasma.  She  has normal extraocular motion.  NECK:  There are no carotid bruits.  There is no jugular venous distention.  HEART:  Cardiac exam reveals a normal S1 and S2.  There are no clicks or  significant murmurs.  ABDOMEN:  The patient's abdomen is  soft with no masses or bruits.  EXTREMITIES:  The patient has no peripheral edema.   ASSESSMENT:  The patient is stable.  Problems include the following:   1. History of good left ventricular function.  2. Palpitations.  3. Very short bursts of supraventricular tachycardia on Holter.  4. Status post hysterectomy.  5. Status post knee surgery.   The patient is clearly feeling better on both the combination of  low-dose  Cardizem and beta blocker.  I have considered dropping the Cardizem to see  how she feels on the beta blocker alone.  I have decided to give her a trial  or remaining on both medications and then I will see her back.            ______________________________  Luis Abed, MD, [facility]     JDK/MedQ  DD:  08/20/2006  DT:  08/22/2006  Job #:  [id number]   cc:   Valetta Mole. Swords, MD

## 2011-03-29 NOTE — Assessment & Plan Note (Signed)
[facility]                              CARDIOLOGY OFFICE NOTE   Karina [name], Karina [name]                      MRN:          [id number]  DATE:07/18/2006                            DOB:          1933/02/04    Karina [name] is a very pleasant 77 year old female who has palpitations.  She  is quite active.  She is under significant stress in that her husband has  had a stroke and is limited, therefore, she is doing most of the activities  at her home including taking care of him.  She has not had any significant  chest pain or shortness of breath.  She has had palpitations.  She had a  prolonged episode and was seen by Dr. Cato Mulligan.  It is my understanding that  the exam at that time seemed compatible with atrial fibrillation.  When the  EKG was obtained, I believe she was in sinus rhythm.  She had a Holter  monitor at that time.  She had a few short 3-4 beat runs of supraventricular  tachycardia.  There were no sustained episodes.  There was no ventricular  tachycardia.  A 2-D echo showed good LV function with no major valvular  abnormalities.  The patient also has history in the past of a Myoview scan  that was done in 11/04 which showed no significant abnormalities.   Recently she has had increasing palpitations.  She is now here for further  evaluation.   PAST MEDICAL HISTORY:  ALLERGIES, NO KNOWN DRUG ALLERGIES.   MEDICATIONS:  Premarin, aspirin, calcium, fish oil, famotidine,  hydrochlorothiazide 25, vitamin D and iron.   OTHER MEDICAL PROBLEMS:  See the list below.   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  She has  never smoked.   FAMILY HISTORY:  There is no strong family history of coronary disease.   REVIEW OF SYSTEMS:  The patient has had some constipation and some reflux.  She has some mild arthritis otherwise her review of systems is  negative.   PHYSICAL EXAMINATION:  VITAL SIGNS:  Blood pressure is 130/80, pulse is 81.  GENERAL:  The patient is oriented to person, place and time and her affect  is normal.  LUNGS:  Clear.  Respiratory effort is not labored.  HEENT:  No xanthelasma.  She has normal extraocular motion.  There are no  carotid bruits, there is no venous distention.  CARDIAC:  There is an S1 with an S2.  There are no clicks of significant  murmurs.  ABDOMEN:  Soft.  There are no masses or bruits.  EXTREMITIES:  There is no peripheral edema.   EKG today is normal.  See the discussion in the HPI about other studies that  have been done including her Myoview, echo and Holter monitor.   Problems include:  1. History of good left ventricular function.  2. Palpitations.  3. Short bursts of supraventricular tachycardia on her Holter. The patient  is symptomatic.  I am not absolutely sure that we have definitely      documented any sustained atrial fibrillation.  I feel that the patient      is safe with normal LV function and it is unlikely that she has      dangerous arrhythmias.  I have chosen to start her on Cardizem CD 120      and see her back for early followup.  If we can control her symptoms      without problems, I think that further workup is probably not needed,      otherwise we will place a CardioNet monitor for one month to get a      better feel for her arrhythmia.  The patient fully understood the      discussion.  She appears happy with the recommendation.  4. Status post hysterectomy.  5. Status post knee surgery.                               Luis Abed, MD, [facility]    JDK/MedQ  DD:  07/18/2006  DT:  07/19/2006  Job #:  [id number]   cc:   Valetta Mole. Swords, MD

## 2011-03-29 NOTE — Assessment & Plan Note (Signed)
[facility]                              CARDIOLOGY OFFICE NOTE   Karina [name], Karina [name]                      MRN:          [id number]  DATE:08/07/2006                            DOB:          [birth year]    Karina [name] is seen for cardiology followup.  I saw her with a complete  evaluation on July 18, 2006.  We know she has some palpitations, and we  know she has had some very short bursts of supraventricular tachycardia.  I  started her on a low dose of diltiazem at 120 mg daily.  She thinks it may  help a little bit, but she notices that when she goes up stairs her heart  rate is still rapid.  She has not had any presyncope or syncope.  She is not  having any chest pain.  Her resting heart rate today is 86.   ALLERGIES:  No known drug allergies.   MEDICATIONS:  1. Premarin.  2. Aspirin.  3. Fish oil,  4. Famotidine.  5. Hydrochlorothiazide 25 mg.  6. Vitamin D.  7. Diltiazem 120 mg.   OTHER MEDICAL PROBLEMS:  See the complete list on my note of July 18, 2006.   REVIEW OF SYSTEMS:  Other than her palpitations, she is feeling well today.  Her review of systems otherwise is negative.   PHYSICAL EXAMINATION:  Blood pressure today 113/66, with a pulse of 86.  LUNGS:  Clear.  Respiratory effort is not labored.  HEENT:  Reveals no xanthelasma.  She has normal extraocular motion.  There  are no carotid bruits.  There is no jugular venous distention.  CARDIAC:  Exam reveals an S1 with an S2. There are no clicks or significant  murmurs.  ABDOMEN:  Soft.  There are no masses or bruits in her abdomen.  She has no peripheral edema.   No labs are done today.   PROBLEMS:  1. History of good left ventricular function.  2. Palpitations.  3. Very short bursts of supraventricular tachycardia on a Holter.  4. Status post hysterectomy.  5. Status post knee surgery.   The patient's resting pulse is slightly increased.  Some of her symptoms  may  be exercise-related, and a beta blocker may help more.  I will keep her on  the low dose of diltiazem and add metoprolol ER 25 mg, and see her back for  followup.  I am hoping that with careful titration of her meds we can help  her feel better.            ______________________________  Luis Abed, MD, [facility]     JDK/MedQ  DD:  08/07/2006  DT:  08/09/2006  Job #:  [id number]   cc:   Valetta Mole. Swords, MD

## 2011-04-09 ENCOUNTER — Telehealth: Payer: Self-pay

## 2011-04-09 NOTE — Telephone Encounter (Signed)
Pt states she was prescribed Venaflaxine for her hot flashes and pt states it is not working. Pt would like to know if she should go back to her Premarin or try something different?

## 2011-04-10 MED ORDER — VENLAFAXINE HCL ER 75 MG PO CP24: 75.0000 mg | ORAL_CAPSULE | Freq: Every day | ORAL | Status: DC

## 2011-04-10 NOTE — Telephone Encounter (Signed)
Pt informed and new RX sent to pharmacy 

## 2011-04-10 NOTE — Telephone Encounter (Signed)
Patient is only on a very low dose of Venlafaxine, if she has not had any trouble with it I would at least try going up some before I gave up on it. It can ultimately be increased to 225mg  but would increase to 75mg  daily for now, give 75mg  tabs 1 tab po daily, #30 with 2 rf to start. If she really wants to go back to the Premarin we can do that instead.

## 2011-05-13 ENCOUNTER — Telehealth: Payer: Self-pay | Admitting: *Deleted

## 2011-05-13 ENCOUNTER — Other Ambulatory Visit: Payer: Self-pay | Admitting: Family Medicine

## 2011-05-13 MED ORDER — VENLAFAXINE HCL ER 150 MG PO CP24: 150.0000 mg | ORAL_CAPSULE | Freq: Every day | ORAL | Status: DC

## 2011-05-13 NOTE — Telephone Encounter (Signed)
Increase venlafaxine to 150mg  daily.  I'll do eRx for 150mg  caps.

## 2011-05-13 NOTE — Telephone Encounter (Signed)
Pt calls and states she continues to have hot flashes on 75 mg venlafaxine.  She has been on this dose for one month.  She was previously on 37.5 mg.  Pt would like to know what else can be done to help hot flashes.  I believe she would be agreeable to increased dose.  Pt has 3 pills left of 75 mg strength.  Please advise.

## 2011-05-13 NOTE — Telephone Encounter (Signed)
Notified pt RX sent to pharm for 150 mg tablets.  She is agreeable with this plan.

## 2011-05-27 ENCOUNTER — Telehealth: Payer: Self-pay | Admitting: Family Medicine

## 2011-05-27 NOTE — Telephone Encounter (Signed)
Patient is still having hot flashes but has started having very frightening vivid dreams, patient has stopped taking the medication, is there something else she can take?

## 2011-05-27 NOTE — Telephone Encounter (Signed)
Please advise 

## 2011-05-27 NOTE — Telephone Encounter (Signed)
We can do hormones but she has to be comfortable with the risk. She may want to come in to discuss her options.

## 2011-05-28 MED ORDER — ESTROGENS CONJUGATED 0.3 MG PO TABS: 0.3000 mg | ORAL_TABLET | ORAL | Status: DC | PRN

## 2011-05-28 NOTE — Telephone Encounter (Signed)
OK to represcribe Premarin 0.3 mg daily, disp: #90. 1 rf

## 2011-05-28 NOTE — Telephone Encounter (Signed)
Pt states she is taking care of her husband that is ill. She can't come in anytime soon. Pt states hospice will be there next week once a day so she can leave? Pt states she has taken hormones for years. Please advise?

## 2011-05-28 NOTE — Telephone Encounter (Signed)
Pt states she is not taking the Premarin, that she is out of it. Pt would like a 90 day supply sent to [facility]

## 2011-05-28 NOTE — Telephone Encounter (Signed)
So if the hot flashes, not stress are the major issue, make sure she is taking her Premarin every day, if she is we can increase this to 0.45 mg tabs daily and then reassess after she starts it. If she wants a different antidepressant we can do that as well but she might want to come in to discuss her options

## 2011-08-01 ENCOUNTER — Encounter: Payer: Self-pay | Admitting: Family Medicine

## 2011-08-05 LAB — DIFFERENTIAL
Basophils Absolute: 0.1
Basophils Relative: 0
Eosinophils Absolute: 0.1
Eosinophils Relative: 1
Eosinophils Relative: 1
Lymphocytes Relative: 22
Lymphocytes Relative: 39
Lymphs Abs: 2
Lymphs Abs: 2.7
Lymphs Abs: 3.1
Monocytes Absolute: 0.6
Monocytes Relative: 4
Monocytes Relative: 8
Neutro Abs: 4
Neutro Abs: 6.5
Neutrophils Relative %: 50
Neutrophils Relative %: 73

## 2011-08-05 LAB — BASIC METABOLIC PANEL
BUN: 3 — ABNORMAL LOW
BUN: 6
CO2: 29
Calcium: 9
Chloride: 102
Chloride: 104
Creatinine, Ser: 0.76
Creatinine, Ser: 0.77
GFR calc Af Amer: >60
GFR calc Af Amer: >60
GFR calc non Af Amer: >60
Glucose, Bld: 106 — ABNORMAL HIGH
Glucose, Bld: 113 — ABNORMAL HIGH
Potassium: 3.3 — ABNORMAL LOW
Potassium: 3.4 — ABNORMAL LOW
Sodium: 137

## 2011-08-05 LAB — POCT CARDIAC MARKERS
Myoglobin, poc: 32.2
Operator id: 192351
Troponin i, poc: <0.05

## 2011-08-05 LAB — URINALYSIS, ROUTINE W REFLEX MICROSCOPIC
Bilirubin Urine: NEGATIVE
Glucose, UA: NEGATIVE
Ketones, ur: NEGATIVE
Nitrite: NEGATIVE
Protein, ur: NEGATIVE

## 2011-08-05 LAB — CBC
HCT: 30.3 — ABNORMAL LOW
HCT: 30.7 — ABNORMAL LOW
HCT: 37.7
Hemoglobin: 10.4 — ABNORMAL LOW
Hemoglobin: 12.9
MCHC: 34.1
MCHC: 34.3
MCHC: 34.4
MCV: 86.5
MCV: 87.2
MCV: 87.3
Platelets: 176
Platelets: 190
RBC: 3.49 — ABNORMAL LOW
RBC: 3.53 — ABNORMAL LOW
RDW: 12.2
RDW: 12.3
RDW: 12.7
WBC: 5.2

## 2011-08-05 LAB — COMPREHENSIVE METABOLIC PANEL
BUN: 12
Calcium: 9.9
Creatinine, Ser: 0.82
Glucose, Bld: 158 — ABNORMAL HIGH
Sodium: 136
Total Protein: 8.7 — ABNORMAL HIGH

## 2011-08-05 LAB — CA 125: CA 125: 10.1

## 2011-08-05 LAB — URINE CULTURE: Culture: NO GROWTH

## 2011-08-08 ENCOUNTER — Other Ambulatory Visit: Payer: Self-pay

## 2011-08-08 MED ORDER — ALPRAZOLAM 0.5 MG PO TABS: 0.5000 mg | ORAL_TABLET | ORAL | Status: DC | PRN

## 2011-08-08 NOTE — Telephone Encounter (Signed)
Please advise refill for Temazepam?

## 2011-08-08 NOTE — Telephone Encounter (Signed)
It is Alprazolam. Ok to refill?

## 2011-08-08 NOTE — Telephone Encounter (Signed)
I do not see an old rx for Temazepam, check with patient and see when she got it and what strength, make sure she does not mean Alprazolam.

## 2011-08-12 ENCOUNTER — Telehealth: Payer: Self-pay | Admitting: Family Medicine

## 2011-08-12 ENCOUNTER — Ambulatory Visit (INDEPENDENT_AMBULATORY_CARE_PROVIDER_SITE_OTHER): Payer: Medicare Other

## 2011-08-12 DIAGNOSIS — Z23 Encounter for immunization: Secondary | ICD-10-CM

## 2011-08-12 MED ORDER — ALPRAZOLAM 0.5 MG PO TABS: 0.5000 mg | ORAL_TABLET | ORAL | Status: DC | PRN

## 2011-08-12 NOTE — Telephone Encounter (Signed)
So it looks like she was given Xanax #30 with 1 refill on 9/27. Did she use them all? Why? Did she misplace it?

## 2011-08-12 NOTE — Telephone Encounter (Signed)
Please advise Xanax refill?  

## 2011-08-12 NOTE — Telephone Encounter (Signed)
RX just needs to be faxed to [facility]. RX printed and faxed.

## 2011-08-12 NOTE — Telephone Encounter (Signed)
Patient needs Xanax Rx [facility] to [facility] [place] will not fill

## 2011-08-12 NOTE — Telephone Encounter (Signed)
Addended by: Court Joy on: 08/12/2011 04:36 PM   Modules accepted: Orders

## 2011-08-12 NOTE — Telephone Encounter (Signed)
Please advise refill? 

## 2011-08-12 NOTE — Telephone Encounter (Signed)
Addended by: Court Joy on: 08/12/2011 05:24 PM   Modules accepted: Orders

## 2011-08-15 ENCOUNTER — Other Ambulatory Visit: Payer: Self-pay

## 2011-08-15 MED ORDER — ALPRAZOLAM 0.5 MG PO TABS: 0.5000 mg | ORAL_TABLET | ORAL | Status: DC | PRN

## 2011-08-15 NOTE — Telephone Encounter (Signed)
[facility] [address] [facility] the [facility]ill for Alprazolam. I called into pharmacy and left RX on answering machine.

## 2011-10-14 ENCOUNTER — Other Ambulatory Visit: Payer: Self-pay

## 2011-10-14 MED ORDER — ALPRAZOLAM 0.5 MG PO TABS: 0.5000 mg | ORAL_TABLET | ORAL | Status: DC | PRN

## 2011-10-14 NOTE — Telephone Encounter (Signed)
Please advise xanax refill?

## 2011-11-26 ENCOUNTER — Telehealth: Payer: Self-pay | Admitting: Family Medicine

## 2011-11-26 NOTE — Telephone Encounter (Signed)
Patient feels like she has the flu, sore throat, achy, fever, just feels awful. Can an Rx be sent in?

## 2011-11-26 NOTE — Telephone Encounter (Signed)
Please advise 

## 2011-11-26 NOTE — Telephone Encounter (Signed)
It does not help greatly but she can have Tamiflu 75 mg po daily x 5 days

## 2011-11-27 MED ORDER — OSELTAMIVIR PHOSPHATE 75 MG PO CAPS: 75.0000 mg | ORAL_CAPSULE | Freq: Every day | ORAL | Status: DC

## 2011-11-27 NOTE — Telephone Encounter (Signed)
Pt informed and also told that there can be SE. RX sent to pharmacy

## 2011-12-16 ENCOUNTER — Ambulatory Visit (INDEPENDENT_AMBULATORY_CARE_PROVIDER_SITE_OTHER): Payer: Medicare Other | Admitting: Family Medicine

## 2011-12-16 ENCOUNTER — Encounter: Payer: Self-pay | Admitting: Family Medicine

## 2011-12-16 DIAGNOSIS — K5900 Constipation, unspecified: Secondary | ICD-10-CM

## 2011-12-16 DIAGNOSIS — I10 Essential (primary) hypertension: Secondary | ICD-10-CM

## 2011-12-16 DIAGNOSIS — I498 Other specified cardiac arrhythmias: Secondary | ICD-10-CM

## 2011-12-16 DIAGNOSIS — J329 Chronic sinusitis, unspecified: Secondary | ICD-10-CM | POA: Insufficient documentation

## 2011-12-16 HISTORY — DX: Chronic sinusitis, unspecified: J32.9

## 2011-12-16 MED ORDER — GUAIFENESIN ER 600 MG PO TB12: 600.0000 mg | ORAL_TABLET | Freq: Two times a day (BID) | ORAL | Status: DC

## 2011-12-16 MED ORDER — HYDROCHLOROTHIAZIDE 25 MG PO TABS: 25.0000 mg | ORAL_TABLET | Freq: Every day | ORAL | Status: DC

## 2011-12-16 MED ORDER — AMOXICILLIN-POT CLAVULANATE 875-125 MG PO TABS: 1.0000 | ORAL_TABLET | Freq: Two times a day (BID) | ORAL | Status: AC

## 2011-12-16 MED ORDER — POLYETHYLENE GLYCOL 3350 17 GM/SCOOP PO POWD: 17.0000 g | Freq: Every day | ORAL | Status: DC | PRN

## 2011-12-16 MED ORDER — ALIGN 4 MG PO CAPS: 1.0000 | ORAL_CAPSULE | Freq: Every day | ORAL | Status: DC

## 2011-12-16 NOTE — Assessment & Plan Note (Signed)
Uses Miralax only prn and is doing well in theis regard

## 2011-12-16 NOTE — Progress Notes (Signed)
Patient ID: Karina [name], female   DOB: 1933-07-26, 78 y.o.   MRN: [id number] Karina [name] [id number] 24-Sep-1933 12/16/2011      Progress Note-Follow Up  Subjective  Chief Complaint  Chief Complaint  Patient presents with  . sinus infection    x 89month    HPI  Patient is a 78 we'll Caucasian female who's been struggling with an illness for the last month. She was widowed this past September and has been struggling with the changes but overall feels she is doing relatively well. Unfortunately, she developed sore throat fevers nasal congestion since that time has never fully recovered. At present she has facial pressure in the forehead and behind the eyes and below each cheek bone. No present of fevers. She has not had any chest pain, palpitations, shortness of breath, anorexia, GI or GU complaints. She has struggled with some increased fatigue. Some low-grade neck pain has also been present intermittently. Denies ear pain or other neurologic complaints at this time. Affect multiple over-the-counter medications including Zicam, TUMS, vitamin C and daytime and nighttime preparations as well as Sudafed.  Past Medical History  Diagnosis Date  . SVT (supraventricular tachycardia) 2007    3-4 beats.. Holter  . Sinus tachycardia     mild-- retesting  . Fatigue 1/11    w/ exercise.. responded to increased beta blocker dose  . Dyslipidemia   . Anemia     iron deficiency  . GERD (gastroesophageal reflux disease)   . Osteoporosis   . Other specified cardiac dysrhythmias 09/17/2010  . OSTEOPOROSIS 08/13/2006  . MEASLES, HX OF 01/28/2011  . HYPERLIPIDEMIA 12/17/2007  . GERD 12/28/2007  . HTN (hypertension) 03/06/2011  . DIVERTICULOSIS, COLON 01/28/2011  . CONSTIPATION 01/28/2011  . CHICKENPOX, HX OF 01/28/2011  . ANEMIA, CHRONIC 08/13/2006  . Post menopausal problems 03/06/2011  . Lesion of external ear 03/06/2011  . Sinusitis 12/16/2011    Past Surgical History  Procedure Date  . Abdominal  hysterectomy     w/ unilateral oophorectomy-bleeding, ovarian cysts, left ovary left in place?  . Breast surgery     Implants  . Right knee arthroscopy, cleaned     Family History  Problem Relation Age of Onset  . Stroke Mother   . Hypertension Mother   . Other Mother     CHF  . Transient ischemic attack Mother   . Cancer Mother 36    cervical  . Cancer Father     stomach, smoker  . Cancer Sister     brain  . Lymphoma Brother   . Stroke Daughter     ? stroke  . Mitral valve prolapse Daughter   . Irritable bowel syndrome Daughter   . Migraines Daughter   . Hypertension Son   . Cancer Paternal Aunt     cancer  . Cancer Paternal Uncle     X 2 CA  . Heart attack Paternal Uncle   . Other Maternal Grandmother     CHF  . Alcohol abuse Brother   . Heart attack Brother     smoker    History   Social History  . Marital Status: Married    Spouse Name: N/A    Number of Children: N/A  . Years of Education: N/A   Occupational History  . Not on file.   Social History Main Topics  . Smoking status: Never Smoker   . Smokeless tobacco: Never Used  . Alcohol Use: No  . Drug Use: No  .  Sexually Active: No   Other Topics Concern  . Not on file   Social History Narrative  . No narrative on file    Current Outpatient Prescriptions on File Prior to Visit  Medication Sig Dispense Refill  . ALPRAZolam (XANAX) 0.5 MG tablet Take 1 tablet (0.5 mg total) by mouth as needed for sleep or anxiety. 1/2 to 2 tabs po bid prn for anxiety, insomnia  30 tablet  1  . aspirin 81 MG tablet Take 81 mg by mouth daily.        . Calcium Carbonate-Vitamin D (CALTRATE 600+D) 600-400 MG-UNIT per tablet Take 1 tablet by mouth 2 (two) times daily.        . Cholecalciferol (VITAMIN D) 1000 UNITS capsule Take 1,000 Units by mouth daily.        Marland Kitchen diltiazem (CARDIZEM) 120 MG tablet Take 120 mg by mouth daily.        Marland Kitchen estrogens, conjugated, (PREMARIN) 0.3 MG tablet Take 1 tablet (0.3 mg total) by  mouth as needed.  90 tablet  1  . fish oil-omega-3 fatty acids 1000 MG capsule Take 2 g by mouth 3 (three) times daily.        . Lutein 6 MG CAPS Take 1 capsule by mouth daily.        . metoprolol (TOPROL-XL) 50 MG 24 hr tablet Take 50 mg by mouth daily.          No Known Allergies  Review of Systems  Review of Systems  Constitutional: Positive for fever and malaise/fatigue.  HENT: Positive for congestion.   Eyes: Negative for discharge.  Respiratory: Positive for cough and sputum production. Negative for shortness of breath and wheezing.   Cardiovascular: Negative for chest pain, palpitations and leg swelling.  Gastrointestinal: Negative for nausea, vomiting, abdominal pain, diarrhea, constipation, blood in stool and melena.  Genitourinary: Negative for dysuria.  Musculoskeletal: Negative for myalgias and falls.  Skin: Negative for rash.  Neurological: Negative for loss of consciousness and headaches.  Endo/Heme/Allergies: Negative for polydipsia.  Psychiatric/Behavioral: Negative for depression and suicidal ideas. The patient is not nervous/anxious and does not have insomnia.     Objective  BP 148/84  Pulse 78  Temp(Src) 99.2 F (37.3 C) (Temporal)  Ht 5\' 3"  (1.6 m)  Wt 125 lb (56.7 kg)  BMI 22.14 kg/m2  SpO2 99%  Physical Exam  Physical Exam  Constitutional: She is oriented to person, place, and time and well-developed, well-nourished, and in no distress. No distress.  HENT:  Head: Normocephalic and atraumatic.  Right Ear: External ear normal.  Left Ear: External ear normal.  Mouth/Throat: Oropharynx is clear and moist.       Nasal mucosa boggy and erythematous  Eyes: Conjunctivae are normal.  Neck: Neck supple. No thyromegaly present.  Cardiovascular: Normal rate, regular rhythm and normal heart sounds.   No murmur heard. Pulmonary/Chest: Effort normal and breath sounds normal. She has no wheezes.  Abdominal: She exhibits no distension and no mass.    Musculoskeletal: She exhibits no edema.  Lymphadenopathy:    She has no cervical adenopathy.  Neurological: She is alert and oriented to person, place, and time.  Skin: Skin is warm and dry. No rash noted. She is not diaphoretic.  Psychiatric: Memory, affect and judgment normal.    Lab Results  Component Value Date   TSH 2.27 03/05/2011   Lab Results  Component Value Date   WBC 5.9 03/05/2011   HGB 11.5* 03/05/2011   HCT  33.4* 03/05/2011   MCV 87.9 03/05/2011   PLT 204.0 03/05/2011   Lab Results  Component Value Date   CREATININE 0.9 03/05/2011   BUN 17 03/05/2011   NA 140 03/05/2011   K 3.6 03/05/2011   CL 99 03/05/2011   CO2 33* 03/05/2011   Lab Results  Component Value Date   ALT 14 03/05/2011   AST 19 03/05/2011   ALKPHOS 56 03/05/2011   BILITOT 0.5 03/05/2011   Lab Results  Component Value Date   CHOL 236* 03/05/2011   Lab Results  Component Value Date   HDL 85.10 03/05/2011   No results found for this basename: LDLCALC   Lab Results  Component Value Date   TRIG 165.0* 03/05/2011   Lab Results  Component Value Date   CHOLHDL 3 03/05/2011     Assessment & Plan  HTN (hypertension) Patient has not been taking her HCTZ agrees to restart it today and to avoid all OTC cough and cold preparations  CONSTIPATION Uses Miralax only prn and is doing well in theis regard  Other specified cardiac dysrhythmias Well controlled on Diltiazem and Metoprolol  Sinusitis Increase fluids, Augmentin bid and Mucinex

## 2011-12-16 NOTE — Assessment & Plan Note (Signed)
Patient has not been taking her HCTZ agrees to restart it today and to avoid all OTC cough and cold preparations

## 2011-12-16 NOTE — Patient Instructions (Signed)

## 2011-12-16 NOTE — Assessment & Plan Note (Signed)
Well controlled on Diltiazem and Metoprolol

## 2011-12-16 NOTE — Assessment & Plan Note (Signed)
Increase fluids, Augmentin bid and Mucinex

## 2011-12-20 ENCOUNTER — Other Ambulatory Visit: Payer: Self-pay | Admitting: Cardiology

## 2011-12-20 MED ORDER — METOPROLOL SUCCINATE ER 50 MG PO TB24: 50.0000 mg | ORAL_TABLET | Freq: Every day | ORAL | Status: DC

## 2012-03-10 ENCOUNTER — Encounter: Payer: Self-pay | Admitting: Family Medicine

## 2012-03-10 ENCOUNTER — Ambulatory Visit (INDEPENDENT_AMBULATORY_CARE_PROVIDER_SITE_OTHER): Payer: Medicare Other | Admitting: Family Medicine

## 2012-03-10 VITALS — BP 134/76 | HR 74 | Temp 97.4°F | Ht 63.0 in | Wt 127.0 lb

## 2012-03-10 DIAGNOSIS — E7800 Pure hypercholesterolemia, unspecified: Secondary | ICD-10-CM

## 2012-03-10 DIAGNOSIS — F418 Other specified anxiety disorders: Secondary | ICD-10-CM

## 2012-03-10 DIAGNOSIS — I10 Essential (primary) hypertension: Secondary | ICD-10-CM

## 2012-03-10 DIAGNOSIS — H6120 Impacted cerumen, unspecified ear: Secondary | ICD-10-CM

## 2012-03-10 DIAGNOSIS — J329 Chronic sinusitis, unspecified: Secondary | ICD-10-CM

## 2012-03-10 DIAGNOSIS — K5900 Constipation, unspecified: Secondary | ICD-10-CM

## 2012-03-10 DIAGNOSIS — H6122 Impacted cerumen, left ear: Secondary | ICD-10-CM

## 2012-03-10 DIAGNOSIS — F4321 Adjustment disorder with depressed mood: Secondary | ICD-10-CM

## 2012-03-10 HISTORY — DX: Pure hypercholesterolemia, unspecified: E78.00

## 2012-03-10 HISTORY — DX: Other specified anxiety disorders: F41.8

## 2012-03-10 HISTORY — DX: Adjustment disorder with depressed mood: F43.21

## 2012-03-10 LAB — HEPATIC FUNCTION PANEL
ALT: 15 U/L (ref 0–35)
AST: 20 U/L (ref 0–37)
Alkaline Phosphatase: 62 U/L (ref 39–117)
Bilirubin, Direct: 0 mg/dL (ref 0.0–0.3)
Total Bilirubin: 0.6 mg/dL (ref 0.3–1.2)

## 2012-03-10 LAB — RENAL FUNCTION PANEL
CO2: 33 mEq/L — ABNORMAL HIGH (ref 19–32)
Calcium: 10.3 mg/dL (ref 8.4–10.5)
Chloride: 98 mEq/L (ref 96–112)
GFR: 63.01 mL/min (ref >60.00)
Sodium: 140 mEq/L (ref 135–145)

## 2012-03-10 LAB — CBC
RBC: 4.12 Mil/uL (ref 3.87–5.11)
RDW: 13.2 % (ref 11.5–14.6)
WBC: 6.5 10*3/uL (ref 4.5–10.5)

## 2012-03-10 LAB — LIPID PANEL
HDL: 85.7 mg/dL (ref >39.00)
Total CHOL/HDL Ratio: 3
VLDL: 28 mg/dL (ref 0.0–40.0)

## 2012-03-10 MED ORDER — ANTIPYRINE-BENZOCAINE 5.4-1.4 % OT SOLN: 3.0000 [drp] | Freq: Every evening | OTIC | Status: AC | PRN

## 2012-03-10 NOTE — Progress Notes (Signed)
Patient ID: Karina [name], female   DOB: May 05, 1933, 78 y.o.   MRN: [id number] Karina [name] [id number] 03/01/1933 03/10/2012      Progress Note-Follow Up  Subjective  Chief Complaint  Chief Complaint  Patient presents with  . Annual Exam    physical and labs    HPI  Patient is a 78 year old female is in today for annual labs and evaluation. She was widowed last September and continues to struggle with grief and loneliness but feels she is generally tolerating it well. She has not had any recent illness but does acknowledge feeling sad and fatigued frequently. She is complaining of some bilateral lower extremity pain. As well as some intermittent left neck pain with a tingling and sharp sensation throughout her shoulder at times. She denies any heavy depression or suicidal ideation. No fevers, chest pain, palpitations, shortness of breath, GI or GU complaints noted at this time  Past Medical History  Diagnosis Date  . SVT (supraventricular tachycardia) 2007    3-4 beats.. Holter  . Sinus tachycardia     mild-- retesting  . Fatigue 1/11    w/ exercise.. responded to increased beta blocker dose  . Dyslipidemia   . Anemia     iron deficiency  . GERD (gastroesophageal reflux disease)   . Osteoporosis   . Other specified cardiac dysrhythmias 09/17/2010  . OSTEOPOROSIS 08/13/2006  . MEASLES, HX OF 01/28/2011  . HYPERLIPIDEMIA 12/17/2007  . GERD 12/28/2007  . HTN (hypertension) 03/06/2011  . DIVERTICULOSIS, COLON 01/28/2011  . CONSTIPATION 01/28/2011  . CHICKENPOX, HX OF 01/28/2011  . ANEMIA, CHRONIC 08/13/2006  . Post menopausal problems 03/06/2011  . Lesion of external ear 03/06/2011  . Sinusitis 12/16/2011  . Hypercholesteremia 03/10/2012  . Grief reaction 03/10/2012    Past Surgical History  Procedure Date  . Abdominal hysterectomy     w/ unilateral oophorectomy-bleeding, ovarian cysts, left ovary left in place?  . Breast surgery     Implants  . Right knee arthroscopy, cleaned       Family History  Problem Relation Age of Onset  . Stroke Mother   . Hypertension Mother   . Other Mother     CHF  . Transient ischemic attack Mother   . Cancer Mother 40    cervical  . Cancer Father     stomach, smoker  . Cancer Sister     brain  . Lymphoma Brother   . Stroke Daughter     ? stroke  . Mitral valve prolapse Daughter   . Irritable bowel syndrome Daughter   . Migraines Daughter   . Hypertension Son   . Cancer Paternal Aunt     cancer  . Cancer Paternal Uncle     X 2 CA  . Heart attack Paternal Uncle   . Other Maternal Grandmother     CHF  . Alcohol abuse Brother   . Heart attack Brother     smoker    History   Social History  . Marital Status: Married    Spouse Name: N/A    Number of Children: N/A  . Years of Education: N/A   Occupational History  . Not on file.   Social History Main Topics  . Smoking status: Never Smoker   . Smokeless tobacco: Never Used  . Alcohol Use: No  . Drug Use: No  . Sexually Active: No   Other Topics Concern  . Not on file   Social History Narrative  . No narrative  on file    Current Outpatient Prescriptions on File Prior to Visit  Medication Sig Dispense Refill  . aspirin 81 MG tablet Take 81 mg by mouth daily.        . Calcium Carbonate-Vitamin D (CALTRATE 600+D) 600-400 MG-UNIT per tablet Take 1 tablet by mouth 2 (two) times daily.        . Cholecalciferol (VITAMIN D) 1000 UNITS capsule Take 1,000 Units by mouth daily.        Marland Kitchen diltiazem (CARDIZEM) 120 MG tablet Take 120 mg by mouth daily.        Marland Kitchen estrogens, conjugated, (PREMARIN) 0.3 MG tablet Take 1 tablet (0.3 mg total) by mouth as needed.  90 tablet  1  . fish oil-omega-3 fatty acids 1000 MG capsule Take 2 g by mouth 3 (three) times daily.        . hydrochlorothiazide (HYDRODIURIL) 25 MG tablet Take 1 tablet (25 mg total) by mouth daily.  90 tablet  3  . Lutein 6 MG CAPS Take 1 capsule by mouth daily.        . metoprolol succinate (TOPROL-XL) 50 MG  24 hr tablet Take 1 tablet (50 mg total) by mouth daily.  90 tablet  2  . polyethylene glycol powder (MIRALAX) powder Take 17 g by mouth daily as needed.  255 g  0  . ALPRAZolam (XANAX) 0.5 MG tablet Take 1 tablet (0.5 mg total) by mouth as needed for sleep or anxiety. 1/2 to 2 tabs po bid prn for anxiety, insomnia  30 tablet  1    No Known Allergies  Review of Systems  Review of Systems  Constitutional: Negative for fever and malaise/fatigue.  HENT: Negative for congestion.   Eyes: Negative for discharge.  Respiratory: Negative for shortness of breath.   Cardiovascular: Negative for chest pain, palpitations and leg swelling.  Gastrointestinal: Negative for nausea, abdominal pain and diarrhea.  Genitourinary: Negative for dysuria.  Musculoskeletal: Negative for falls.  Skin: Negative for rash.  Neurological: Negative for loss of consciousness and headaches.  Endo/Heme/Allergies: Negative for polydipsia.  Psychiatric/Behavioral: Negative for depression and suicidal ideas. The patient is not nervous/anxious and does not have insomnia.     Objective  BP 134/76  Pulse 74  Temp(Src) 97.4 F (36.3 C) (Temporal)  Ht 5\' 3"  (1.6 m)  Wt 127 lb (57.607 kg)  BMI 22.50 kg/m2  SpO2 100%  Physical Exam  Physical Exam  Constitutional: She is oriented to person, place, and time and well-developed, well-nourished, and in no distress. No distress.  HENT:  Head: Normocephalic and atraumatic.  Right Ear: External ear normal.  Left Ear: External ear normal.  Nose: Nose normal.  Mouth/Throat: Oropharynx is clear and moist. No oropharyngeal exudate.       Left TM obscured by cerumen  Eyes: Conjunctivae are normal. Pupils are equal, round, and reactive to light. Right eye exhibits no discharge. Left eye exhibits no discharge. No scleral icterus.  Neck: Normal range of motion. Neck supple. No thyromegaly present.  Cardiovascular: Normal rate, regular rhythm, normal heart sounds and intact  distal pulses.   No murmur heard. Pulmonary/Chest: Effort normal and breath sounds normal. No respiratory distress. She has no wheezes. She has no rales.  Abdominal: Soft. Bowel sounds are normal. She exhibits no distension and no mass. There is no tenderness.  Musculoskeletal: Normal range of motion. She exhibits no edema and no tenderness.  Lymphadenopathy:    She has no cervical adenopathy.  Neurological: She is alert  and oriented to person, place, and time. She has normal reflexes. No cranial nerve deficit. Coordination normal.  Skin: Skin is warm and dry. No rash noted. She is not diaphoretic.  Psychiatric: Mood, memory and affect normal.    Lab Results  Component Value Date   TSH 2.27 03/05/2011   Lab Results  Component Value Date   WBC 5.9 03/05/2011   HGB 11.5* 03/05/2011   HCT 33.4* 03/05/2011   MCV 87.9 03/05/2011   PLT 204.0 03/05/2011   Lab Results  Component Value Date   CREATININE 0.9 03/05/2011   BUN 17 03/05/2011   NA 140 03/05/2011   K 3.6 03/05/2011   CL 99 03/05/2011   CO2 33* 03/05/2011   Lab Results  Component Value Date   ALT 14 03/05/2011   AST 19 03/05/2011   ALKPHOS 56 03/05/2011   BILITOT 0.5 03/05/2011   Lab Results  Component Value Date   CHOL 236* 03/05/2011   Lab Results  Component Value Date   HDL 85.10 03/05/2011   No results found for this basename: LDLCALC   Lab Results  Component Value Date   TRIG 165.0* 03/05/2011   Lab Results  Component Value Date   CHOLHDL 3 03/05/2011     Assessment & Plan  Grief reaction Overall she feels she is ding OK, has better and worse days, will not start any meds now but if things worsen she will let us know. She is advised that she can call Hospice for some bereavement counseling also if she thinks she needs it  HTN (hypertension) Improved with recheck, no changes to meds today  Hypercholesteremia Will recheck lipids today, avoid trans fats, start  A fatty acid supplement  CONSTIPATION Does well  with intermittent Miralax, may continue as needed and add a probiotic  Sinusitis resolved

## 2012-03-10 NOTE — Assessment & Plan Note (Signed)
Improved with recheck, no changes to meds today

## 2012-03-10 NOTE — Patient Instructions (Signed)
Cerumen Impaction A cerumen impaction is when the wax in your ear forms a plug. This plug usually causes reduced hearing. Sometimes it also causes an earache or dizziness. Removing a cerumen impaction can be difficult and painful. The wax sticks to the ear canal. The canal is sensitive and bleeds easily. If you try to remove a heavy wax buildup with a cotton tipped swab, you may push it in further. Irrigation with water, suction, and small ear curettes may be used to clear out the wax. If the impaction is fixed to the skin in the ear canal, ear drops may be needed for a few days to loosen the wax. People who build up a lot of wax frequently can use ear wax removal products available in your local drugstore. SEEK MEDICAL CARE IF:  You develop an earache, increased hearing loss, or marked dizziness. Document Released: 12/05/2004 Document Revised: 10/17/2011 Document Reviewed: 01/25/2010 [facility] Patient Information 2012 [place], [place].  Restart probiotic caps daily Use Miralax as needed to move bowels comfortably  Aspercreme and moist heat to neck and knees Call about Zostavax and we can give at ear appt

## 2012-03-10 NOTE — Assessment & Plan Note (Signed)
resolved 

## 2012-03-10 NOTE — Assessment & Plan Note (Signed)
Does well with intermittent Miralax, may continue as needed and add a probiotic

## 2012-03-10 NOTE — Assessment & Plan Note (Signed)
Will recheck lipids today, avoid trans fats, start  A fatty acid supplement

## 2012-03-10 NOTE — Assessment & Plan Note (Signed)
Overall she feels she is ding OK, has better and worse days, will not start any meds now but if things worsen she will let us know. She is advised that she can call Hospice for some bereavement counseling also if she thinks she needs it

## 2012-03-11 LAB — LDL CHOLESTEROL, DIRECT: Direct LDL: 156.5 mg/dL

## 2012-03-17 ENCOUNTER — Ambulatory Visit (INDEPENDENT_AMBULATORY_CARE_PROVIDER_SITE_OTHER): Payer: Medicare Other | Admitting: Family Medicine

## 2012-03-17 DIAGNOSIS — Z23 Encounter for immunization: Secondary | ICD-10-CM

## 2012-03-17 DIAGNOSIS — H6120 Impacted cerumen, unspecified ear: Secondary | ICD-10-CM

## 2012-03-17 NOTE — Progress Notes (Signed)
Pt was in for ear impaction cleaning and Zoster injection. Pt handled both good

## 2012-03-17 NOTE — Progress Notes (Signed)
Patient ID: Karina [name], female   DOB: 02/14/1933, 78 y.o.   MRN: [id number] Patient in today for nurse visit, had ears flushed and shingles vaccine give. Tolerated well

## 2012-05-18 ENCOUNTER — Other Ambulatory Visit: Payer: Self-pay | Admitting: Cardiology

## 2012-05-18 MED ORDER — DILTIAZEM HCL 120 MG PO TABS: 120.0000 mg | ORAL_TABLET | Freq: Every day | ORAL | Status: DC

## 2012-05-25 ENCOUNTER — Other Ambulatory Visit: Payer: Self-pay | Admitting: Cardiology

## 2012-05-25 NOTE — Telephone Encounter (Signed)
Ref # [id number]

## 2012-05-26 MED ORDER — ESTROGENS CONJUGATED 0.3 MG PO TABS: 0.3000 mg | ORAL_TABLET | ORAL | Status: DC | PRN

## 2012-05-27 ENCOUNTER — Telehealth: Payer: Self-pay | Admitting: Cardiology

## 2012-05-27 NOTE — Telephone Encounter (Signed)
Refill f/u - Diltiazem   Patient said she has not recvd medication  And [facility]verified preferred) does not have a RX refill for her of this medication. Plz return call to patient at hm# to advise.

## 2012-05-29 ENCOUNTER — Other Ambulatory Visit: Payer: Self-pay

## 2012-05-29 ENCOUNTER — Telehealth: Payer: Self-pay | Admitting: Cardiology

## 2012-05-29 MED ORDER — DILTIAZEM HCL ER COATED BEADS 120 MG PO CP24: 120.0000 mg | ORAL_CAPSULE | Freq: Every day | ORAL | Status: DC

## 2012-05-29 MED ORDER — DILTIAZEM HCL 120 MG PO TABS: 120.0000 mg | ORAL_TABLET | Freq: Every day | ORAL | Status: DC

## 2012-05-29 NOTE — Telephone Encounter (Signed)
Have not received any other messages.  [facility] was called and I verified the rx of Diltiazem CD after clarifying this with Dr Myrtis Ser.  Pt was notified that [facility] was called and rx was ordered.

## 2012-05-29 NOTE — Telephone Encounter (Signed)
Pt calling re rx for diltiazem , said she called here four times, I only see one, and the pharmacy called, I dont see that at all, they needs to verify directions, pt will be out Saturday and needs it called in asap, pls call when done [phone] ok to leave message

## 2012-06-24 ENCOUNTER — Encounter: Payer: Self-pay | Admitting: Family Medicine

## 2012-07-10 ENCOUNTER — Ambulatory Visit: Payer: Medicare Other | Admitting: Family Medicine

## 2012-08-04 ENCOUNTER — Ambulatory Visit (INDEPENDENT_AMBULATORY_CARE_PROVIDER_SITE_OTHER): Payer: Medicare Other

## 2012-08-04 DIAGNOSIS — Z23 Encounter for immunization: Secondary | ICD-10-CM

## 2012-09-08 ENCOUNTER — Other Ambulatory Visit: Payer: Self-pay | Admitting: Cardiology

## 2012-09-10 MED ORDER — METOPROLOL SUCCINATE ER 50 MG PO TB24: 50.0000 mg | ORAL_TABLET | Freq: Every day | ORAL | Status: DC

## 2012-09-10 NOTE — Telephone Encounter (Signed)
..   Requested Prescriptions   Pending Prescriptions Disp Refills  . metoprolol succinate (TOPROL-XL) 50 MG 24 hr tablet 90 tablet 2    Sig: Take 1 tablet (50 mg total) by mouth daily.

## 2012-10-04 ENCOUNTER — Encounter: Payer: Self-pay | Admitting: Cardiology

## 2012-10-04 DIAGNOSIS — IMO0001 Reserved for inherently not codable concepts without codable children: Secondary | ICD-10-CM | POA: Insufficient documentation

## 2012-10-06 ENCOUNTER — Encounter: Payer: Self-pay | Admitting: Cardiology

## 2012-10-06 ENCOUNTER — Ambulatory Visit (INDEPENDENT_AMBULATORY_CARE_PROVIDER_SITE_OTHER): Payer: Medicare Other | Admitting: Cardiology

## 2012-10-06 VITALS — BP 130/66 | HR 74 | Ht 63.0 in | Wt 130.4 lb

## 2012-10-06 DIAGNOSIS — IMO0002 Reserved for concepts with insufficient information to code with codable children: Secondary | ICD-10-CM | POA: Insufficient documentation

## 2012-10-06 DIAGNOSIS — R0989 Other specified symptoms and signs involving the circulatory and respiratory systems: Secondary | ICD-10-CM

## 2012-10-06 DIAGNOSIS — R9430 Abnormal result of cardiovascular function study, unspecified: Secondary | ICD-10-CM | POA: Insufficient documentation

## 2012-10-06 DIAGNOSIS — R000 Tachycardia, unspecified: Secondary | ICD-10-CM | POA: Insufficient documentation

## 2012-10-06 DIAGNOSIS — I471 Supraventricular tachycardia, unspecified: Secondary | ICD-10-CM | POA: Insufficient documentation

## 2012-10-06 DIAGNOSIS — I498 Other specified cardiac arrhythmias: Secondary | ICD-10-CM

## 2012-10-06 DIAGNOSIS — I10 Essential (primary) hypertension: Secondary | ICD-10-CM

## 2012-10-06 MED ORDER — DILTIAZEM HCL 120 MG PO TABS: 120.0000 mg | ORAL_TABLET | Freq: Every day | ORAL | Status: DC

## 2012-10-06 NOTE — Assessment & Plan Note (Signed)
By history she has mild sinus tachycardia at rest. Beta blocker helps with this and her blood pressure. No change in therapy.  I will plan to see her again in 2 years for followup.

## 2012-10-06 NOTE — Patient Instructions (Addendum)
Your physician wants you to follow-up in: 2 years  You will receive a reminder letter in the mail two months in advance. If you don't receive a letter, please call our office to schedule the follow-up appointment.  Your physician recommends that you continue on your current medications as directed. Please refer to the Current Medication list given to you today.   

## 2012-10-06 NOTE — Progress Notes (Signed)
HPI The patient is seen to followup a history of supraventricular tachycardia. In the past she had a few beats of supraventricular tachycardia by Holter. She also had some mild resting sinus tachycardia. We have treated her with low-dose beta-blockade and low dose diltiazem. She does very well with this. I see her once every 2 years. She has very brief episodes of a few palpitations. These are not prolonged. She does not have chest pain or shortness of breath. She did have one episode of feeling presyncopal. This was not associated with palpitations. This occurred while she was at church. She has not had any recurrences.  As part of today's evaluation I have reviewed all of her old records. I have completely updated the new electronic medical record.  No Known Allergies  Current Outpatient Prescriptions  Medication Sig Dispense Refill  . ALPRAZolam (XANAX) 0.5 MG tablet Take 1 tablet (0.5 mg total) by mouth as needed for sleep or anxiety. 1/2 to 2 tabs po bid prn for anxiety, insomnia  30 tablet  1  . aspirin 81 MG tablet Take 81 mg by mouth daily.        . Calcium Carbonate-Vitamin D (CALTRATE 600+D) 600-400 MG-UNIT per tablet Take 1 tablet by mouth 2 (two) times daily.        . Cholecalciferol (VITAMIN D) 1000 UNITS capsule Take 1,000 Units by mouth daily.        Marland Kitchen diltiazem (CARDIZEM) 120 MG tablet Take 1 tablet (120 mg total) by mouth daily.  90 tablet  1  . estrogens, conjugated, (PREMARIN) 0.3 MG tablet Take 1 tablet (0.3 mg total) by mouth as needed.  90 tablet  0  . fish oil-omega-3 fatty acids 1000 MG capsule Take 2 g by mouth 3 (three) times daily.        . hydrochlorothiazide (HYDRODIURIL) 25 MG tablet Take 1 tablet (25 mg total) by mouth daily.  90 tablet  3  . Lutein 6 MG CAPS Take 1 capsule by mouth daily.        . metoprolol succinate (TOPROL-XL) 50 MG 24 hr tablet Take 1 tablet (50 mg total) by mouth daily.  90 tablet  2  . NON FORMULARY Wears a patch      . polyethylene  glycol powder (MIRALAX) powder Take 17 g by mouth daily as needed.  255 g  0  . Probiotic Product (PEARLS IC PO) Take by mouth.        History   Social History  . Marital Status: Married    Spouse Name: N/A    Number of Children: N/A  . Years of Education: N/A   Occupational History  . Not on file.   Social History Main Topics  . Smoking status: Never Smoker   . Smokeless tobacco: Never Used  . Alcohol Use: No  . Drug Use: No  . Sexually Active: No   Other Topics Concern  . Not on file   Social History Narrative  . No narrative on file    Family History  Problem Relation Age of Onset  . Stroke Mother   . Hypertension Mother   . Other Mother     CHF  . Transient ischemic attack Mother   . Cancer Mother 61    cervical  . Cancer Father     stomach, smoker  . Cancer Sister     brain  . Lymphoma Brother   . Stroke Daughter     ? stroke  . Mitral  valve prolapse Daughter   . Irritable bowel syndrome Daughter   . Migraines Daughter   . Hypertension Son   . Cancer Paternal Aunt     cancer  . Cancer Paternal Uncle     X 2 CA  . Heart attack Paternal Uncle   . Other Maternal Grandmother     CHF  . Alcohol abuse Brother   . Heart attack Brother     smoker    Past Medical History  Diagnosis Date  . SVT (supraventricular tachycardia) 2007    3-4 beats.. Holter  . Sinus tachycardia     mild-- retesting  . Fatigue 1/11    w/ exercise.. responded to increased beta blocker dose  . Dyslipidemia   . Anemia     iron deficiency  . GERD (gastroesophageal reflux disease)   . Osteoporosis   . Other specified cardiac dysrhythmias 09/17/2010  . OSTEOPOROSIS 08/13/2006  . MEASLES, HX OF 01/28/2011  . HYPERLIPIDEMIA 12/17/2007  . GERD 12/28/2007  . HTN (hypertension) 03/06/2011  . DIVERTICULOSIS, COLON 01/28/2011  . CONSTIPATION 01/28/2011  . CHICKENPOX, HX OF 01/28/2011  . ANEMIA, CHRONIC 08/13/2006  . Post menopausal problems 03/06/2011  . Lesion of external ear  03/06/2011  . Sinusitis 12/16/2011  . Hypercholesteremia 03/10/2012  . Grief reaction 03/10/2012  . Ejection fraction     EF 55%, echo, 2007  . Normal cardiac stress test     Nuclear, 2004, normal    Past Surgical History  Procedure Date  . Abdominal hysterectomy     w/ unilateral oophorectomy-bleeding, ovarian cysts, left ovary left in place?  . Breast surgery     Implants  . Right knee arthroscopy, cleaned     Patient Active Problem List  Diagnosis  . HYPERLIPIDEMIA  . ANEMIA, CHRONIC  . Other specified cardiac dysrhythmias  . GERD  . OSTEOPOROSIS  . DIVERTICULOSIS, COLON  . CONSTIPATION  . MEASLES, HX OF  . CHICKENPOX, HX OF  . HTN (hypertension)  . Post menopausal problems  . Lesion of external ear  . Sinusitis  . Hypercholesteremia  . Grief reaction  . Normal cardiac stress test  . Ejection fraction    ROS   Patient denies fever, chills, headache, sweats, rash, change in vision, change in hearing, chest pain, cough, nausea vomiting, urinary symptoms. All other systems are reviewed and are negative.  PHYSICAL EXAM  Patient is oriented to person time and place. Affect is normal. There is no jugulovenous distention. Lungs are clear. Respiratory effort is nonlabored. Cardiac exam reveals S1 and S2. There no clicks or significant murmurs. The abdomen is soft. There is no peripheral edema. There are no musculoskeletal deformities. There are no skin rashes.  Filed Vitals:   10/06/12 1046  BP: 130/66  Pulse: 74  Height: 5\' 3"  (1.6 m)  Weight: 130 lb 6.4 oz (59.149 kg)   EKG is done today and reviewed by me. There is sinus rhythm. There is no significant abnormality. There is no change from the past.  ASSESSMENT & PLAN

## 2012-10-06 NOTE — Assessment & Plan Note (Signed)
By  History she's had very brief episodes of supraventricular tachycardia.She is on low-dose diltiazem for this. She has very rare palpitations. No further workup is needed in no change in therapy is needed.

## 2012-10-06 NOTE — Assessment & Plan Note (Signed)
Blood pressure is well-controlled. No change in therapy. She and I talked about blood pressure ranges.

## 2013-01-01 ENCOUNTER — Encounter: Payer: Self-pay | Admitting: Family Medicine

## 2013-01-01 ENCOUNTER — Ambulatory Visit (INDEPENDENT_AMBULATORY_CARE_PROVIDER_SITE_OTHER): Payer: Medicare Other | Admitting: Family Medicine

## 2013-01-01 VITALS — BP 162/82 | HR 61 | Ht 63.0 in | Wt 132.0 lb

## 2013-01-01 DIAGNOSIS — S76201A Unspecified injury of adductor muscle, fascia and tendon of right thigh, initial encounter: Secondary | ICD-10-CM

## 2013-01-01 DIAGNOSIS — S76209A Unspecified injury of adductor muscle, fascia and tendon of unspecified thigh, initial encounter: Secondary | ICD-10-CM | POA: Insufficient documentation

## 2013-01-01 NOTE — Patient Instructions (Addendum)
Take one celebrex capsule (200 mg) once daily with food. Ice knee 20 min (2-3 times per day for the next 2-3d). Rest the knee for at least 3d (no exercises or prolonged walking). You will be contacted about your PT referral.

## 2013-01-01 NOTE — Assessment & Plan Note (Signed)
With extension down into pes anserine bursa/sheath region. No sign of internal knee derangement. PT referral to [facility] PT, ice to the area 20 min periodically over the next 2-3d, celebrex 200mg  qd x 12d (samples given). Discussed knee x-ray to see if any knee osteoarth changes but with normal knee exam today I think we are ok to defer this and possibly do this if she is not improved at f/u in 1 mo.

## 2013-01-01 NOTE — Progress Notes (Signed)
OFFICE NOTE  01/01/2013  CC:  Chief Complaint  Patient presents with  . Knee Pain    began 2 days ago     HPI: Patient is a 79 y.o. Caucasian female who is here for right knee pain.   Hx of arthroscopic surg on that knee 20+ yrs ago.  Has hurt off and on since then. A couple of days ago it began hurting worse while she was doing some yard work and bending knees a lot. No swelling, no redness.  No buckling or locking up.  She limps some.  Has not taken any med for it.  Pertinent PMH:  Past Medical History  Diagnosis Date  . SVT (supraventricular tachycardia) 2007    3-4 beats.. Holter  . Sinus tachycardia     mild-- retesting  . Fatigue 1/11    w/ exercise.. responded to increased beta blocker dose  . Dyslipidemia   . Anemia     iron deficiency  . GERD (gastroesophageal reflux disease)   . Osteoporosis   . Other specified cardiac dysrhythmias 09/17/2010  . OSTEOPOROSIS 08/13/2006  . MEASLES, HX OF 01/28/2011  . HYPERLIPIDEMIA 12/17/2007  . GERD 12/28/2007  . HTN (hypertension) 03/06/2011  . DIVERTICULOSIS, COLON 01/28/2011  . CONSTIPATION 01/28/2011  . CHICKENPOX, HX OF 01/28/2011  . ANEMIA, CHRONIC 08/13/2006  . Post menopausal problems 03/06/2011  . Lesion of external ear 03/06/2011  . Sinusitis 12/16/2011  . Hypercholesteremia 03/10/2012  . Grief reaction 03/10/2012  . Ejection fraction     EF 55%, echo, 2007  . Normal cardiac stress test     Nuclear, 2004, normal   Past Surgical History  Procedure Laterality Date  . Abdominal hysterectomy      w/ unilateral oophorectomy-bleeding, ovarian cysts, left ovary left in place?  . Breast surgery      Implants  . Right knee arthroscopy, cleaned       MEDS:  Outpatient Prescriptions Prior to Visit  Medication Sig Dispense Refill  . ALPRAZolam (XANAX) 0.5 MG tablet Take 1 tablet (0.5 mg total) by mouth as needed for sleep or anxiety. 1/2 to 2 tabs po bid prn for anxiety, insomnia  30 tablet  1  . aspirin 81 MG tablet Take 81  mg by mouth daily.        . Calcium Carbonate-Vitamin D (CALTRATE 600+D) 600-400 MG-UNIT per tablet Take 1 tablet by mouth 2 (two) times daily.        . Cholecalciferol (VITAMIN D) 1000 UNITS capsule Take 1,000 Units by mouth daily.        Marland Kitchen diltiazem (CARDIZEM) 120 MG tablet Take 1 tablet (120 mg total) by mouth daily.  90 tablet  3  . estrogens, conjugated, (PREMARIN) 0.3 MG tablet Take 1 tablet (0.3 mg total) by mouth as needed.  90 tablet  0  . fish oil-omega-3 fatty acids 1000 MG capsule Take 2 g by mouth 3 (three) times daily.        . Lutein 6 MG CAPS Take 1 capsule by mouth daily.        . metoprolol succinate (TOPROL-XL) 50 MG 24 hr tablet Take 1 tablet (50 mg total) by mouth daily.  90 tablet  2  . polyethylene glycol powder (MIRALAX) powder Take 17 g by mouth daily as needed.  255 g  0  . Probiotic Product (PEARLS IC PO) Take by mouth.      . hydrochlorothiazide (HYDRODIURIL) 25 MG tablet Take 1 tablet (25 mg total) by mouth  daily.  90 tablet  3  . NON FORMULARY Wears a patch       No facility-administered medications prior to visit.    PE: Blood pressure 162/82, pulse 61, height 5\' 3"  (1.6 m), weight 132 lb (59.875 kg). Gen: Alert, well appearing.  Patient is oriented to person, place, time, and situation. AFFECT: pleasant, lucid thought and speech. Knees: no erythema, swelling, or tenderness of either knee.  She has full ROM of both knees without locking/popping.  No knee instability.  She has tenderness over a 4-6 inch portion of the distal right thigh adductor muscles.  No masses.  Popliteal region nontender and w/out mass.   Also has tenderness directly over pes anserine bursa region, and this extends up to the region of tenderness in her adductor muscles. Gait: walks with a limp favoring right knee.  IMPRESSION AND PLAN:  Injury of adductor muscle and tendon of thigh With extension down into pes anserine bursa/sheath region. No sign of internal knee derangement. PT  referral to [facility] PT, ice to the area 20 min periodically over the next 2-3d, celebrex 200mg  qd x 12d (samples given). Discussed knee x-ray to see if any knee osteoarth changes but with normal knee exam today I think we are ok to defer this and possibly do this if she is not improved at f/u in 1 mo.   An After Visit Summary was printed and given to the patient.  FOLLOW UP: 78mo

## 2013-01-28 ENCOUNTER — Ambulatory Visit (INDEPENDENT_AMBULATORY_CARE_PROVIDER_SITE_OTHER): Payer: Medicare Other | Admitting: Family Medicine

## 2013-01-28 ENCOUNTER — Ambulatory Visit (INDEPENDENT_AMBULATORY_CARE_PROVIDER_SITE_OTHER)
Admission: RE | Admit: 2013-01-28 | Discharge: 2013-01-28 | Disposition: A | Discharge status: Home / Self Care | Payer: Medicare Other | Source: Ambulatory Visit | Attending: Family Medicine | Admitting: Family Medicine

## 2013-01-28 ENCOUNTER — Encounter: Payer: Self-pay | Admitting: Family Medicine

## 2013-01-28 VITALS — BP 148/79 | HR 59 | Temp 98.4°F | Resp 14 | Wt 130.5 lb

## 2013-01-28 DIAGNOSIS — M25561 Pain in right knee: Secondary | ICD-10-CM

## 2013-01-28 DIAGNOSIS — I10 Essential (primary) hypertension: Secondary | ICD-10-CM

## 2013-01-28 DIAGNOSIS — M25569 Pain in unspecified knee: Secondary | ICD-10-CM

## 2013-01-28 DIAGNOSIS — M25562 Pain in left knee: Secondary | ICD-10-CM

## 2013-01-28 NOTE — Progress Notes (Signed)
OFFICE NOTE  02/02/2013  CC:  Chief Complaint  Patient presents with  . Follow-up    1 month follow up for knee pain     HPI: Patient is a 79 y.o. Caucasian female who is here for 1 mo f/u knee pain that I dx'd as distal thigh adductor strain and mild pes anserine bursitis.  She has been going to PT. Home bp checks have been normal. Feeling better regarding her knee, but still laments about some generalized anterior knee pains off and on, some associated tightness feeling in area behind both knees during these times.  This is noted upon getting up and walking after sitting for prolonged periods.  No buckling or locking-up of knees, no swelling of knees, no redness of knees.  Pertinent PMH:  Past Medical History  Diagnosis Date  . SVT (supraventricular tachycardia) 2007    3-4 beats.. Holter  . Sinus tachycardia     mild-- retesting  . Fatigue 1/11    w/ exercise.. responded to increased beta blocker dose  . Dyslipidemia   . Anemia     iron deficiency  . GERD (gastroesophageal reflux disease)   . Osteoporosis   . Other specified cardiac dysrhythmias 09/17/2010  . OSTEOPOROSIS 08/13/2006  . MEASLES, HX OF 01/28/2011  . HYPERLIPIDEMIA 12/17/2007  . GERD 12/28/2007  . HTN (hypertension) 03/06/2011  . DIVERTICULOSIS, COLON 01/28/2011  . CONSTIPATION 01/28/2011  . CHICKENPOX, HX OF 01/28/2011  . ANEMIA, CHRONIC 08/13/2006  . Post menopausal problems 03/06/2011  . Lesion of external ear 03/06/2011  . Sinusitis 12/16/2011  . Hypercholesteremia 03/10/2012  . Grief reaction 03/10/2012  . Ejection fraction     EF 55%, echo, 2007  . Normal cardiac stress test     Nuclear, 2004, normal    MEDS:  Outpatient Prescriptions Prior to Visit  Medication Sig Dispense Refill  . ALPRAZolam (XANAX) 0.5 MG tablet Take 1 tablet (0.5 mg total) by mouth as needed for sleep or anxiety. 1/2 to 2 tabs po bid prn for anxiety, insomnia  30 tablet  1  . aspirin 81 MG tablet Take 81 mg by mouth daily.        .  Calcium Carbonate-Vitamin D (CALTRATE 600+D) 600-400 MG-UNIT per tablet Take 1 tablet by mouth 2 (two) times daily.        . Cholecalciferol (VITAMIN D) 1000 UNITS capsule Take 1,000 Units by mouth daily.        Marland Kitchen diltiazem (CARDIZEM) 120 MG tablet Take 1 tablet (120 mg total) by mouth daily.  90 tablet  3  . estrogens, conjugated, (PREMARIN) 0.3 MG tablet Take 1 tablet (0.3 mg total) by mouth as needed.  90 tablet  0  . fish oil-omega-3 fatty acids 1000 MG capsule Take 2 g by mouth 3 (three) times daily.        . Lutein 6 MG CAPS Take 1 capsule by mouth daily.        . metoprolol succinate (TOPROL-XL) 50 MG 24 hr tablet Take 1 tablet (50 mg total) by mouth daily.  90 tablet  2  . polyethylene glycol powder (MIRALAX) powder Take 17 g by mouth daily as needed.  255 g  0  . Probiotic Product (PEARLS IC PO) Take by mouth.       No facility-administered medications prior to visit.    PE: Blood pressure 148/79, pulse 59, temperature 98.4 F (36.9 C), temperature source Temporal, resp. rate 14, weight 130 lb 8 oz (59.194 kg), SpO2 100.00%.  Gen: Alert, well appearing.  Patient is oriented to person, place, time, and situation. KNEES: mild right leg TTP over distal thigh adductors just proxima to knee joint level, extending down into pes anserine bursa region.  No swelling or erythema in this area. Both knees are without any deformity, swelling, erythema, or warmth.  No joint line TTP, no knee instability, no patellar subluxation.  No TTP of the quad tendon or patellar tendon areas.  McMurray's testing negative.   IMPRESSION AND PLAN:  Knee pain, bilateral Suspect patellofemoral arthritis/mild DJD knees.  Her right leg adductor mm strain + mild pes anserine bursitis is slowly improving. X-ray both knees today for baseline. Recommended plenty of daily walking.  Discussed best first-line pain therapy for this condition: tylenol 1000mg  q6h prn. Signs/symptoms to call or return for were reviewed and  pt expressed understanding.   HTN (hypertension) BP monitoring at home shows normal numbers per pt. BP up here. No changes made in HTN regimen.   An After Visit Summary was printed and given to the patient.  FOLLOW UP: prn

## 2013-02-02 DIAGNOSIS — M25562 Pain in left knee: Secondary | ICD-10-CM | POA: Insufficient documentation

## 2013-02-02 NOTE — Assessment & Plan Note (Addendum)
Suspect patellofemoral arthritis/mild DJD knees.  Her right leg adductor mm strain + mild pes anserine bursitis is slowly improving. X-ray both knees today for baseline. Recommended plenty of daily walking.  Discussed best first-line pain therapy for this condition: tylenol 1000mg  q6h prn. Signs/symptoms to call or return for were reviewed and pt expressed understanding.

## 2013-02-02 NOTE — Assessment & Plan Note (Signed)
BP monitoring at home shows normal numbers per pt. BP up here. No changes made in HTN regimen.

## 2013-03-08 ENCOUNTER — Telehealth: Payer: Self-pay

## 2013-03-08 DIAGNOSIS — I10 Essential (primary) hypertension: Secondary | ICD-10-CM

## 2013-03-08 DIAGNOSIS — E785 Hyperlipidemia, unspecified: Secondary | ICD-10-CM

## 2013-03-08 DIAGNOSIS — D539 Nutritional anemia, unspecified: Secondary | ICD-10-CM

## 2013-03-08 NOTE — Telephone Encounter (Signed)
Labs lipid, renal, cbc, tsh, hepatic, cbc for HTN, anemia and hyperlipid

## 2013-03-08 NOTE — Telephone Encounter (Signed)
Please advise which labs and diagnosis codes please

## 2013-03-08 NOTE — Telephone Encounter (Signed)
Message copied by Court Joy on Mon Mar 08, 2013 11:21 AM ------      Message from: Darral Dash E      Created: Mon Mar 08, 2013 11:14 AM      Regarding: Labs       Patient scheduled for CPE  , June 5th  Coming in for labs prior, please order labs ------

## 2013-03-09 NOTE — Telephone Encounter (Signed)
Labs ordered.

## 2013-03-11 ENCOUNTER — Telehealth: Payer: Self-pay

## 2013-03-11 DIAGNOSIS — E785 Hyperlipidemia, unspecified: Secondary | ICD-10-CM

## 2013-03-11 DIAGNOSIS — D539 Nutritional anemia, unspecified: Secondary | ICD-10-CM

## 2013-03-11 DIAGNOSIS — I10 Essential (primary) hypertension: Secondary | ICD-10-CM

## 2013-03-11 NOTE — Telephone Encounter (Signed)
Labs ordered.

## 2013-03-11 NOTE — Telephone Encounter (Signed)
Needs cbc, tsh, hepatic, renal, lipid for htn, hyperlipidia and anemia

## 2013-03-11 NOTE — Telephone Encounter (Signed)
Message copied by Court Joy on Thu Mar 11, 2013  9:13 AM ------      Message from: Darral Dash E      Created: Mon Mar 08, 2013 11:14 AM      Regarding: Labs       Patient scheduled for CPE  , June 5th  Coming in for labs prior, please order labs ------

## 2013-03-11 NOTE — Telephone Encounter (Signed)
Please advise which labs to be ordered and diagnosis code and I will order

## 2013-04-12 LAB — LIPID PANEL
Cholesterol: 276 mg/dL — ABNORMAL HIGH (ref 0–200)
HDL: 76 mg/dL (ref >39)
Triglycerides: 130 mg/dL (ref <150)

## 2013-04-12 LAB — CBC
MCH: 27.7 pg (ref 26.0–34.0)
MCHC: 33.4 g/dL (ref 30.0–36.0)
MCV: 82.9 fL (ref 78.0–100.0)
Platelets: 248 10*3/uL (ref 150–400)
RDW: 13.1 % (ref 11.5–15.5)
WBC: 5.7 10*3/uL (ref 4.0–10.5)

## 2013-04-12 LAB — HEPATIC FUNCTION PANEL
ALT: 11 U/L (ref 0–35)
AST: 15 U/L (ref 0–37)
Albumin: 4 g/dL (ref 3.5–5.2)
Bilirubin, Direct: 0.1 mg/dL (ref 0.0–0.3)
Total Protein: 7.6 g/dL (ref 6.0–8.3)

## 2013-04-12 LAB — RENAL FUNCTION PANEL
Albumin: 4 g/dL (ref 3.5–5.2)
Calcium: 9.8 mg/dL (ref 8.4–10.5)
Phosphorus: 3.5 mg/dL (ref 2.3–4.6)
Potassium: 3.5 mEq/L (ref 3.5–5.3)
Sodium: 137 mEq/L (ref 135–145)

## 2013-04-15 ENCOUNTER — Other Ambulatory Visit (HOSPITAL_COMMUNITY)
Admission: RE | Admit: 2013-04-15 | Discharge: 2013-04-15 | Disposition: A | Discharge status: Home / Self Care | Payer: Medicare Other | Source: Ambulatory Visit | Attending: Family Medicine | Admitting: Family Medicine

## 2013-04-15 ENCOUNTER — Ambulatory Visit (INDEPENDENT_AMBULATORY_CARE_PROVIDER_SITE_OTHER): Payer: Medicare Other | Admitting: Family Medicine

## 2013-04-15 ENCOUNTER — Encounter: Payer: Self-pay | Admitting: Family Medicine

## 2013-04-15 VITALS — BP 108/78 | HR 59 | Temp 97.9°F | Resp 16 | Ht 63.0 in | Wt 128.0 lb

## 2013-04-15 DIAGNOSIS — D539 Nutritional anemia, unspecified: Secondary | ICD-10-CM

## 2013-04-15 DIAGNOSIS — R000 Tachycardia, unspecified: Secondary | ICD-10-CM

## 2013-04-15 DIAGNOSIS — E785 Hyperlipidemia, unspecified: Secondary | ICD-10-CM

## 2013-04-15 DIAGNOSIS — I10 Essential (primary) hypertension: Secondary | ICD-10-CM

## 2013-04-15 DIAGNOSIS — K5900 Constipation, unspecified: Secondary | ICD-10-CM

## 2013-04-15 DIAGNOSIS — I498 Other specified cardiac arrhythmias: Secondary | ICD-10-CM

## 2013-04-15 DIAGNOSIS — Z780 Asymptomatic menopausal state: Secondary | ICD-10-CM

## 2013-04-15 DIAGNOSIS — F4321 Adjustment disorder with depressed mood: Secondary | ICD-10-CM

## 2013-04-15 DIAGNOSIS — Z124 Encounter for screening for malignant neoplasm of cervix: Secondary | ICD-10-CM

## 2013-04-15 MED ORDER — ALPRAZOLAM 0.5 MG PO TABS: 0.5000 mg | ORAL_TABLET | ORAL | Status: DC | PRN

## 2013-04-15 MED ORDER — ATORVASTATIN CALCIUM 10 MG PO TABS: 10.0000 mg | ORAL_TABLET | Freq: Every day | ORAL | Status: DC

## 2013-04-15 MED ORDER — ESTROGENS CONJUGATED 0.3 MG PO TABS: 0.3000 mg | ORAL_TABLET | ORAL | Status: DC | PRN

## 2013-04-15 NOTE — Patient Instructions (Addendum)
Labs prior to visit lipid, renal, cbc, tsh, hepatic  Start Co Q 10 with the Atoravastatin  No trans fats/partially hydrogenated oils   Cholesterol Cholesterol is a white, waxy, fat-like protein needed by your body in small amounts. The liver makes all the cholesterol you need. It is carried from the liver by the blood through the blood vessels. Deposits (plaque) may build up on blood vessel walls. This makes the arteries narrower and stiffer. Plaque increases the risk for heart attack and stroke. You cannot feel your cholesterol level even if it is very high. The only way to know is by a blood test to check your lipid (fats) levels. Once you know your cholesterol levels, you should keep a record of the test results. Work with your caregiver to to keep your levels in the desired range. WHAT THE RESULTS MEAN:  Total cholesterol is a rough measure of all the cholesterol in your blood.  LDL is the so-called bad cholesterol. This is the type that deposits cholesterol in the walls of the arteries. You want this level to be low.  HDL is the good cholesterol because it cleans the arteries and carries the LDL away. You want this level to be high.  Triglycerides are fat that the body can either burn for energy or store. High levels are closely linked to heart disease. DESIRED LEVELS:  Total cholesterol below 200.  LDL below 100 for people at risk, below 70 for very high risk.  HDL above 50 is good, above 60 is best.  Triglycerides below 150. HOW TO LOWER YOUR CHOLESTEROL:  Diet.  Choose fish or white meat chicken and [place], roasted or baked. Limit fatty cuts of red meat, fried foods, and processed meats, such as sausage and lunch meat.  Eat lots of fresh fruits and vegetables. Choose whole grains, beans, pasta, potatoes and cereals.  Use only small amounts of olive, corn or canola oils. Avoid butter, mayonnaise, shortening or palm kernel oils. Avoid foods with trans-fats.  Use  skim/nonfat milk and low-fat/nonfat yogurt and cheeses. Avoid whole milk, cream, ice cream, egg yolks and cheeses. Healthy desserts include angel food cake, ginger snaps, animal crackers, hard candy, popsicles, and low-fat/nonfat frozen yogurt. Avoid pastries, cakes, pies and cookies.  Exercise.  A regular program helps decrease LDL and raises HDL.  Helps with weight control.  Do things that increase your activity level like gardening, walking, or taking the stairs.  Medication.  May be prescribed by your caregiver to help lowering cholesterol and the risk for heart disease.  You may need medicine even if your levels are normal if you have several risk factors. HOME CARE INSTRUCTIONS   Follow your diet and exercise programs as suggested by your caregiver.  Take medications as directed.  Have blood work done when your caregiver feels it is necessary. MAKE SURE YOU:   Understand these instructions.  Will watch your condition.  Will get help right away if you are not doing well or get worse. Document Released: 07/23/2001 Document Revised: 01/20/2012 Document Reviewed: 01/13/2008 [facility] Patient Information 2014 [place], [place].

## 2013-04-15 NOTE — Progress Notes (Signed)
Patient ID: AYDA TANCREDI, female   DOB: 1932-12-04, 79 y.o.   MRN: [id number] TREVOR DUTY [id number] [birth year] 04/15/2013      Progress Note-Follow Up  Subjective  Chief Complaint  Chief Complaint  Patient presents with  . Annual Exam    Pt here for non-fasting physical. Pt up to date with tetanus and shingles vaccines. Last mammogram 06/2012, las pap smear 01/30/11. Colonoscopy 2004. Had EKG 09/2012 with cardiology.    HPI  Patient is a 79 year old Caucasian female in today for annual exam. Struggles with some hot flashes. Is struggling with knee pain. Has had osteoarthritis documented. Use is alprazolam infrequently as her grief has improved some. She still uses it for sleep intermittently. No recent illness. No chest pain or palpitations. No shortness of breath GI or GU concerns noted today.  Past Medical History  Diagnosis Date  . SVT (supraventricular tachycardia) 2007    3-4 beats.. Holter  . Sinus tachycardia     mild-- retesting  . Fatigue 1/11    w/ exercise.. responded to increased beta blocker dose  . Dyslipidemia   . Anemia     iron deficiency  . GERD (gastroesophageal reflux disease)   . Osteoporosis   . Other specified cardiac dysrhythmias(427.89) 09/17/2010  . OSTEOPOROSIS 08/13/2006  . MEASLES, HX OF 01/28/2011  . HYPERLIPIDEMIA 12/17/2007  . GERD 12/28/2007  . HTN (hypertension) 03/06/2011  . DIVERTICULOSIS, COLON 01/28/2011  . CONSTIPATION 01/28/2011  . CHICKENPOX, HX OF 01/28/2011  . ANEMIA, CHRONIC 08/13/2006  . Post menopausal problems 03/06/2011  . Lesion of external ear 03/06/2011  . Sinusitis 12/16/2011  . Hypercholesteremia 03/10/2012  . Grief reaction 03/10/2012  . Ejection fraction     EF 55%, echo, 2007  . Normal cardiac stress test     Nuclear, 2004, normal  . Arthritis     left knee    Past Surgical History  Procedure Laterality Date  . Abdominal hysterectomy      w/ unilateral oophorectomy-bleeding, ovarian cysts, left ovary left in place?   . Breast surgery      Implants  . Right knee arthroscopy, cleaned      Family History  Problem Relation Age of Onset  . Stroke Mother   . Hypertension Mother   . Other Mother     CHF  . Transient ischemic attack Mother   . Cancer Mother 87    cervical  . Cancer Father     stomach, smoker  . Cancer Sister     brain  . Lymphoma Brother   . Stroke Daughter     ? stroke  . Mitral valve prolapse Daughter   . Irritable bowel syndrome Daughter   . Migraines Daughter   . Hypertension Son   . Cancer Paternal Aunt     cancer  . Cancer Paternal Uncle     X 2 CA  . Heart attack Paternal Uncle   . Other Maternal Grandmother     CHF  . Alcohol abuse Brother   . Heart attack Brother     smoker    History   Social History  . Marital Status: Married    Spouse Name: N/A    Number of Children: N/A  . Years of Education: N/A   Occupational History  . Not on file.   Social History Main Topics  . Smoking status: Never Smoker   . Smokeless tobacco: Never Used  . Alcohol Use: No  . Drug Use: No  . Sexually  Active: No   Other Topics Concern  . Not on file   Social History Narrative  . No narrative on file    Current Outpatient Prescriptions on File Prior to Visit  Medication Sig Dispense Refill  . aspirin 81 MG tablet Take 81 mg by mouth daily.        . Calcium Carbonate-Vitamin D (CALTRATE 600+D) 600-400 MG-UNIT per tablet Take 1 tablet by mouth 2 (two) times daily.        . Cholecalciferol (VITAMIN D) 1000 UNITS capsule Take 1,000 Units by mouth daily.        Marland Kitchen diltiazem (CARDIZEM) 120 MG tablet Take 1 tablet (120 mg total) by mouth daily.  90 tablet  3  . fish oil-omega-3 fatty acids 1000 MG capsule Take 2 g by mouth 3 (three) times daily.        . Lutein 6 MG CAPS Take 1 capsule by mouth daily.        . metoprolol succinate (TOPROL-XL) 50 MG 24 hr tablet Take 1 tablet (50 mg total) by mouth daily.  90 tablet  2  . polyethylene glycol powder (MIRALAX) powder Take 17  g by mouth daily as needed.  255 g  0  . Probiotic Product (PEARLS IC PO) Take by mouth.       No current facility-administered medications on file prior to visit.    No Known Allergies  Review of Systems  Review of Systems  Constitutional: Negative for fever and malaise/fatigue.  HENT: Negative for congestion.   Eyes: Negative for discharge.  Respiratory: Negative for shortness of breath.   Cardiovascular: Negative for chest pain, palpitations and leg swelling.  Gastrointestinal: Negative for nausea, abdominal pain and diarrhea.  Genitourinary: Negative for dysuria.  Musculoskeletal: Negative for falls.  Skin: Negative for rash.  Neurological: Negative for loss of consciousness and headaches.  Endo/Heme/Allergies: Negative for polydipsia.  Psychiatric/Behavioral: Negative for depression and suicidal ideas. The patient is not nervous/anxious and does not have insomnia.     Objective  BP 108/78  Pulse 59  Temp(Src) 97.9 F (36.6 C) (Oral)  Resp 16  Ht 5\' 3"  (1.6 m)  Wt 128 lb (58.06 kg)  BMI 22.68 kg/m2  SpO2 98%  Physical Exam  Physical Exam  Constitutional: She is oriented to person, place, and time and well-developed, well-nourished, and in no distress. No distress.  HENT:  Head: Normocephalic and atraumatic.  Eyes: Conjunctivae are normal.  Neck: Neck supple. No thyromegaly present.  Cardiovascular: Normal rate, regular rhythm and normal heart sounds.   No murmur heard. Pulmonary/Chest: Effort normal and breath sounds normal. She has no wheezes.  Abdominal: She exhibits no distension and no mass.  Genitourinary: Uterus normal, right adnexa normal and left adnexa normal. No vaginal discharge found.  Musculoskeletal: She exhibits no edema.  Lymphadenopathy:    She has no cervical adenopathy.  Neurological: She is alert and oriented to person, place, and time.  Skin: Skin is warm and dry. No rash noted. She is not diaphoretic.  Psychiatric: Memory, affect and  judgment normal.    Lab Results  Component Value Date   TSH 2.405 04/12/2013   Lab Results  Component Value Date   WBC 5.7 04/12/2013   HGB 11.8* 04/12/2013   HCT 35.3* 04/12/2013   MCV 82.9 04/12/2013   PLT 248 04/12/2013   Lab Results  Component Value Date   CREATININE 0.90 04/12/2013   BUN 19 04/12/2013   NA 137 04/12/2013   K 3.5  04/12/2013   CL 101 04/12/2013   CO2 29 04/12/2013   Lab Results  Component Value Date   ALT 11 04/12/2013   AST 15 04/12/2013   ALKPHOS 77 04/12/2013   BILITOT 0.6 04/12/2013   Lab Results  Component Value Date   CHOL 276* 04/12/2013   Lab Results  Component Value Date   HDL 76 04/12/2013   Lab Results  Component Value Date   LDLCALC 174* 04/12/2013   Lab Results  Component Value Date   TRIG 130 04/12/2013   Lab Results  Component Value Date   CHOLHDL 3.6 04/12/2013     Assessment & Plan  Postmenopausal status Pap today  HTN (hypertension) Pap well controlled, no changes to meds  Grief reaction Doing somewhat better. Is using Alprazolam prn, may continue  CONSTIPATION Well control, continue probiotics.   ANEMIA, CHRONIC Mild encouraged leafy greens and lean red meats.  Sinus tachycardia Well controlled, no changes.

## 2013-04-15 NOTE — Assessment & Plan Note (Signed)
Pap today 

## 2013-04-18 NOTE — Assessment & Plan Note (Signed)
Well controlled, no changes 

## 2013-04-18 NOTE — Assessment & Plan Note (Signed)
Pap well controlled, no changes to meds

## 2013-04-18 NOTE — Assessment & Plan Note (Signed)
Well control, continue probiotics.

## 2013-04-18 NOTE — Assessment & Plan Note (Signed)
Mild encouraged leafy greens and lean red meats.

## 2013-04-18 NOTE — Assessment & Plan Note (Signed)
Doing somewhat better. Is using Alprazolam prn, may continue

## 2013-04-21 ENCOUNTER — Telehealth: Payer: Self-pay | Admitting: Family Medicine

## 2013-04-21 NOTE — Telephone Encounter (Signed)
Jeffery/Patient Phone [phone] returned Karina [name]'s call from 04/21/13.  Did not know what it was concerning.  Per Epic message from Dr Rogelia Rohrer 04/20/13, informed PAP test was normal.  No call back needed unless there was more information for caller.

## 2013-04-28 ENCOUNTER — Telehealth: Payer: Self-pay

## 2013-04-28 NOTE — Telephone Encounter (Signed)
Received paperwork from [facility] stating the PA for Premarin would be covered until 04-23-14.  Letter sent to be scanned

## 2013-06-17 ENCOUNTER — Other Ambulatory Visit: Payer: Self-pay

## 2013-06-17 MED ORDER — METOPROLOL SUCCINATE ER 50 MG PO TB24: 50.0000 mg | ORAL_TABLET | Freq: Every day | ORAL | Status: DC

## 2013-07-06 ENCOUNTER — Encounter: Payer: Self-pay | Admitting: Family Medicine

## 2013-08-10 ENCOUNTER — Ambulatory Visit (INDEPENDENT_AMBULATORY_CARE_PROVIDER_SITE_OTHER): Payer: Medicare Other | Admitting: Family

## 2013-08-10 ENCOUNTER — Encounter: Payer: Self-pay | Admitting: Family

## 2013-08-10 VITALS — BP 150/84 | HR 62 | Temp 97.7°F | Resp 16 | Ht 63.0 in | Wt 122.1 lb

## 2013-08-10 DIAGNOSIS — Z23 Encounter for immunization: Secondary | ICD-10-CM

## 2013-08-10 DIAGNOSIS — N904 Leukoplakia of vulva: Secondary | ICD-10-CM

## 2013-08-10 DIAGNOSIS — N760 Acute vaginitis: Secondary | ICD-10-CM

## 2013-08-10 DIAGNOSIS — N949 Unspecified condition associated with female genital organs and menstrual cycle: Secondary | ICD-10-CM

## 2013-08-10 HISTORY — DX: Leukoplakia of vulva: N90.4

## 2013-08-10 NOTE — Progress Notes (Signed)
Subjective:    Patient ID: Karina [name], female    DOB: November 23, 1932, 79 y.o.   MRN: [id number]  HPI  [name] is a 79 yr old female who presents today with chief complaint of vaginal discomfort.  Reports + intermittent vaginal itching x 3 weeks.  Tried monistat.  Helped some.  Reports vaginal fullness,  like something has dropped.  Denies associated vaginal discharge, dysuria, frequency, or urinary problems/incontinence. Not sexually active.   Pt is G3P2.   Review of Systems    see HPI  Past Medical History  Diagnosis Date  . SVT (supraventricular tachycardia) 2007    3-4 beats.. Holter  . Sinus tachycardia     mild-- retesting  . Fatigue 1/11    w/ exercise.. responded to increased beta blocker dose  . Dyslipidemia   . Anemia     iron deficiency  . GERD (gastroesophageal reflux disease)   . Osteoporosis   . Other specified cardiac dysrhythmias(427.89) 09/17/2010  . OSTEOPOROSIS 08/13/2006  . MEASLES, HX OF 01/28/2011  . HYPERLIPIDEMIA 12/17/2007  . GERD 12/28/2007  . HTN (hypertension) 03/06/2011  . DIVERTICULOSIS, COLON 01/28/2011  . CONSTIPATION 01/28/2011  . CHICKENPOX, HX OF 01/28/2011  . ANEMIA, CHRONIC 08/13/2006  . Post menopausal problems 03/06/2011  . Lesion of external ear 03/06/2011  . Sinusitis 12/16/2011  . Hypercholesteremia 03/10/2012  . Grief reaction 03/10/2012  . Ejection fraction     EF 55%, echo, 2007  . Normal cardiac stress test     Nuclear, 2004, normal  . Arthritis     left knee    History   Social History  . Marital Status: Married    Spouse Name: N/A    Number of Children: N/A  . Years of Education: N/A   Occupational History  . Not on file.   Social History Main Topics  . Smoking status: Never Smoker   . Smokeless tobacco: Never Used  . Alcohol Use: No  . Drug Use: No  . Sexual Activity: No   Other Topics Concern  . Not on file   Social History Narrative  . No narrative on file    Past Surgical History  Procedure Laterality  Date  . Abdominal hysterectomy      w/ unilateral oophorectomy-bleeding, ovarian cysts, left ovary left in place?  . Breast surgery      Implants  . Right knee arthroscopy, cleaned      Family History  Problem Relation Age of Onset  . Stroke Mother   . Hypertension Mother   . Other Mother     CHF  . Transient ischemic attack Mother   . Cancer Mother 44    cervical  . Cancer Father     stomach, smoker  . Cancer Sister     brain  . Lymphoma Brother   . Stroke Daughter     ? stroke  . Mitral valve prolapse Daughter   . Irritable bowel syndrome Daughter   . Migraines Daughter   . Hypertension Son   . Cancer Paternal Aunt     cancer  . Cancer Paternal Uncle     X 2 CA  . Heart attack Paternal Uncle   . Other Maternal Grandmother     CHF  . Alcohol abuse Brother   . Heart attack Brother     smoker    No Known Allergies  Current Outpatient Prescriptions on File Prior to Visit  Medication Sig Dispense Refill  . ALPRAZolam (XANAX) 0.5 MG  tablet Take 1 tablet (0.5 mg total) by mouth as needed for sleep or anxiety. 1/2 to 2 tabs po bid prn for anxiety, insomnia  30 tablet  1  . aspirin 81 MG tablet Take 81 mg by mouth daily.        . Calcium Carbonate-Vitamin D (CALTRATE 600+D) 600-400 MG-UNIT per tablet Take 1 tablet by mouth 2 (two) times daily.        . Cholecalciferol (VITAMIN D) 1000 UNITS capsule Take 1,000 Units by mouth daily.        Marland Kitchen diltiazem (CARDIZEM) 120 MG tablet Take 1 tablet (120 mg total) by mouth daily.  90 tablet  3  . estrogens, conjugated, (PREMARIN) 0.3 MG tablet Take 1 tablet (0.3 mg total) by mouth as needed.  90 tablet  0  . fish oil-omega-3 fatty acids 1000 MG capsule Take 2 g by mouth 3 (three) times daily.        . Lutein 6 MG CAPS Take 1 capsule by mouth daily.        . metoprolol succinate (TOPROL-XL) 50 MG 24 hr tablet Take 1 tablet (50 mg total) by mouth daily.  90 tablet  2  . polyethylene glycol powder (MIRALAX) powder Take 17 g by mouth  daily as needed.  255 g  0  . Probiotic Product (PEARLS IC PO) Take by mouth.      Marland Kitchen atorvastatin (LIPITOR) 10 MG tablet Take 1 tablet (10 mg total) by mouth daily.  90 tablet  1   No current facility-administered medications on file prior to visit.    BP 150/84  Pulse 62  Temp(Src) 97.7 F (36.5 C) (Oral)  Resp 16  Ht 5\' 3"  (1.6 m)  Wt 122 lb 1.3 oz (55.375 kg)  BMI 21.63 kg/m2  SpO2 96%    Objective:   Physical Exam  Constitutional: She appears well-developed and well-nourished. No distress.  Cardiovascular: Normal rate and regular rhythm.   No murmur heard. Pulmonary/Chest: Effort normal and breath sounds normal. No respiratory distress. She has no wheezes. She has no rales. She exhibits no tenderness.  Genitourinary:  No visible bladder prolapse with straining.  Normal bimanual exam.    No vaginal lesions.  Some scarring is noted above right perineum (? Secondary to episiotomy) Vaginal mucosa is dry/pale and atrophic appearing  Musculoskeletal: She exhibits no edema.          Assessment & Plan:

## 2013-08-10 NOTE — Patient Instructions (Addendum)
We will contact you with the results of your lab work

## 2013-08-10 NOTE — Assessment & Plan Note (Signed)
Wet prep obtained today.  Plan to treat if +.  I suspect that vaginal atrophy may be contributing to her symptoms.  If wet prep unremarkable, consider estrace cream.  No obvious bladder prolapse, but could consider referral to GYN if symptoms worsen or if symptoms do not improve.

## 2013-08-11 ENCOUNTER — Telehealth: Payer: Self-pay | Admitting: Family

## 2013-08-11 LAB — WET PREP BY MOLECULAR PROBE: Candida species: NEGATIVE

## 2013-08-11 NOTE — Telephone Encounter (Signed)
Please call pt and let her know that her wet prep is normal. As we discussed, her discomfort is most likely due to vaginal atrophy since she is menopausal.   I would recommend that she try a vaginal moisturizer such as Replens.  If symptoms worsen or if no improvement with Replens, let me know and we can try a vaginal estrogen cream.

## 2013-08-12 NOTE — Telephone Encounter (Signed)
Caller: Karina [name]/Patient; Phone: [phone]; Reason for Call: Returned call from office staff regarding recent lab.  Per 08/11/13 Epic note from Dr Abner Greenspan, informed the wet prep was normal.  As discussed at appointment, her discomfort is most likely due to vaginal atrophy because she is menopausal.  Dr Abner Greenspan recommended she try a vaginal moisturizer such as Replens.  If symptoms worsen or if no improvement with Replens, let Dr Abner Greenspan know and MD can try a prescription vaginal estrogen cream.  Verbalized understanding.

## 2013-08-12 NOTE — Telephone Encounter (Signed)
Left message to return my call on home #. 

## 2013-09-27 ENCOUNTER — Other Ambulatory Visit: Payer: Self-pay | Admitting: Family Medicine

## 2013-09-27 MED ORDER — ALPRAZOLAM 0.5 MG PO TABS: 0.5000 mg | ORAL_TABLET | ORAL | Status: DC | PRN

## 2013-09-27 NOTE — Telephone Encounter (Signed)
Patient is requesting a 90 day supply of alprazolam to be sent to [facility] on [address]

## 2013-09-27 NOTE — Telephone Encounter (Signed)
Left a message for patient to return my call.   Per md pt needs to schedule an appt for any additional refills are done

## 2013-09-27 NOTE — Telephone Encounter (Signed)
Please advise RX request? Last RX was done on 04-15-13 quantity 30 with 1 refill.  If ok fax to [phone]

## 2013-09-30 ENCOUNTER — Ambulatory Visit (INDEPENDENT_AMBULATORY_CARE_PROVIDER_SITE_OTHER): Payer: Medicare Other | Admitting: Family Medicine

## 2013-09-30 ENCOUNTER — Encounter: Payer: Self-pay | Admitting: Family Medicine

## 2013-09-30 VITALS — BP 148/74 | HR 69 | Temp 97.4°F | Ht 63.0 in | Wt 122.1 lb

## 2013-09-30 DIAGNOSIS — I498 Other specified cardiac arrhythmias: Secondary | ICD-10-CM

## 2013-09-30 DIAGNOSIS — R252 Cramp and spasm: Secondary | ICD-10-CM

## 2013-09-30 DIAGNOSIS — I10 Essential (primary) hypertension: Secondary | ICD-10-CM

## 2013-09-30 DIAGNOSIS — N952 Postmenopausal atrophic vaginitis: Secondary | ICD-10-CM

## 2013-09-30 DIAGNOSIS — R3911 Hesitancy of micturition: Secondary | ICD-10-CM

## 2013-09-30 DIAGNOSIS — Z23 Encounter for immunization: Secondary | ICD-10-CM

## 2013-09-30 DIAGNOSIS — I471 Supraventricular tachycardia, unspecified: Secondary | ICD-10-CM

## 2013-09-30 DIAGNOSIS — E785 Hyperlipidemia, unspecified: Secondary | ICD-10-CM

## 2013-09-30 LAB — RENAL FUNCTION PANEL
Albumin: 4.3 g/dL (ref 3.5–5.2)
BUN: 22 mg/dL (ref 6–23)
CO2: 31 mEq/L (ref 19–32)
Calcium: 9.4 mg/dL (ref 8.4–10.5)
Creat: 0.79 mg/dL (ref 0.50–1.10)
Glucose, Bld: 79 mg/dL (ref 70–99)
Phosphorus: 3.5 mg/dL (ref 2.3–4.6)
Potassium: 3.9 mEq/L (ref 3.5–5.3)

## 2013-09-30 LAB — CBC
HCT: 33.7 % — ABNORMAL LOW (ref 36.0–46.0)
Hemoglobin: 11.4 g/dL — ABNORMAL LOW (ref 12.0–15.0)
MCH: 29.4 pg (ref 26.0–34.0)
MCHC: 33.8 g/dL (ref 30.0–36.0)
MCV: 86.9 fL (ref 78.0–100.0)
RDW: 13 % (ref 11.5–15.5)

## 2013-09-30 LAB — HEPATIC FUNCTION PANEL
ALT: 10 U/L (ref 0–35)
Albumin: 4.3 g/dL (ref 3.5–5.2)
Alkaline Phosphatase: 66 U/L (ref 39–117)
Indirect Bilirubin: 0.3 mg/dL (ref 0.0–0.9)
Total Protein: 7.1 g/dL (ref 6.0–8.3)

## 2013-09-30 LAB — LIPID PANEL
Cholesterol: 184 mg/dL (ref 0–200)
VLDL: 19 mg/dL (ref 0–40)

## 2013-09-30 MED ORDER — ESTROGENS, CONJUGATED 0.625 MG/GM VA CREA: 1.0000 | TOPICAL_CREAM | VAGINAL | Status: DC

## 2013-09-30 NOTE — Patient Instructions (Signed)
64 oz of clear fluids Magnesium/calcium/zinc tab daily   Urinary Tract Infection Urinary tract infections (UTIs) can develop anywhere along your urinary tract. Your urinary tract is your body's drainage system for removing wastes and extra water. Your urinary tract includes two kidneys, two ureters, a bladder, and a urethra. Your kidneys are a pair of bean-shaped organs. Each kidney is about the size of your fist. They are located below your ribs, one on each side of your spine. CAUSES Infections are caused by microbes, which are microscopic organisms, including fungi, viruses, and bacteria. These organisms are so small that they can only be seen through a microscope. Bacteria are the microbes that most commonly cause UTIs. SYMPTOMS  Symptoms of UTIs may vary by age and gender of the patient and by the location of the infection. Symptoms in young women typically include a frequent and intense urge to urinate and a painful, burning feeling in the bladder or urethra during urination. Older women and men are more likely to be tired, shaky, and weak and have muscle aches and abdominal pain. A fever may mean the infection is in your kidneys. Other symptoms of a kidney infection include pain in your back or sides below the ribs, nausea, and vomiting. DIAGNOSIS To diagnose a UTI, your caregiver will ask you about your symptoms. Your caregiver also will ask to provide a urine sample. The urine sample will be tested for bacteria and white blood cells. White blood cells are made by your body to help fight infection. TREATMENT  Typically, UTIs can be treated with medication. Because most UTIs are caused by a bacterial infection, they usually can be treated with the use of antibiotics. The choice of antibiotic and length of treatment depend on your symptoms and the type of bacteria causing your infection. HOME CARE INSTRUCTIONS  If you were prescribed antibiotics, take them exactly as your caregiver instructs  you. Finish the medication even if you feel better after you have only taken some of the medication.  Drink enough water and fluids to keep your urine clear or pale yellow.  Avoid caffeine, tea, and carbonated beverages. They tend to irritate your bladder.  Empty your bladder often. Avoid holding urine for long periods of time.  Empty your bladder before and after sexual intercourse.  After a bowel movement, women should cleanse from front to back. Use each tissue only once. SEEK MEDICAL CARE IF:   You have back pain.  You develop a fever.  Your symptoms do not begin to resolve within 3 days. SEEK IMMEDIATE MEDICAL CARE IF:   You have severe back pain or lower abdominal pain.  You develop chills.  You have nausea or vomiting.  You have continued burning or discomfort with urination. MAKE SURE YOU:   Understand these instructions.  Will watch your condition.  Will get help right away if you are not doing well or get worse. Document Released: 08/07/2005 Document Revised: 04/28/2012 Document Reviewed: 12/06/2011 [facility] Patient Information 2014 [place], [place].

## 2013-09-30 NOTE — Progress Notes (Signed)
Pre visit review using our clinic review tool, if applicable. No additional management support is needed unless otherwise documented below in the visit note. 

## 2013-10-01 LAB — URINALYSIS
Bilirubin Urine: NEGATIVE
Nitrite: NEGATIVE
Specific Gravity, Urine: 1.017 (ref 1.005–1.030)
Urobilinogen, UA: 0.2 mg/dL (ref 0.0–1.0)
pH: 7 (ref 5.0–8.0)

## 2013-10-01 LAB — URINE CULTURE
Colony Count: NO GROWTH
Organism ID, Bacteria: NO GROWTH

## 2013-10-03 ENCOUNTER — Encounter: Payer: Self-pay | Admitting: Family Medicine

## 2013-10-03 DIAGNOSIS — N952 Postmenopausal atrophic vaginitis: Secondary | ICD-10-CM | POA: Insufficient documentation

## 2013-10-03 HISTORY — DX: Postmenopausal atrophic vaginitis: N95.2

## 2013-10-03 NOTE — Assessment & Plan Note (Signed)
No concerns on exam today

## 2013-10-03 NOTE — Assessment & Plan Note (Signed)
Well-controlled on recheck. 

## 2013-10-03 NOTE — Assessment & Plan Note (Signed)
Some stenosis noted at urethral opening, will try low dose Premarin cream topically to this area once a week, if no response to urology

## 2013-10-03 NOTE — Progress Notes (Signed)
Patient ID: Karina [name], female   DOB: Mar 23, 1933, 79 y.o.   MRN: [id number] Karina [name] [id number] 10/05/1933 10/03/2013      Progress Note-Follow Up  Subjective  Chief Complaint  Chief Complaint  Patient presents with  . Medication Refill  . Injections    prevnar    HPI  Patient is a 79 year old Caucasian female who is in today for evaluation. She is noting some difficulty with urinary symptoms. She says it's hard to void and she feels as if something is blocking the urine exiting. She denies dysuria. She otherwise has felt generally well. No GI complaints, chest pain or palpitations. denies fevers chills, back pain or belly pain. She denies hematuria.  Past Medical History  Diagnosis Date  . SVT (supraventricular tachycardia) 2007    3-4 beats.. Holter  . Sinus tachycardia     mild-- retesting  . Fatigue 1/11    w/ exercise.. responded to increased beta blocker dose  . Dyslipidemia   . Anemia     iron deficiency  . GERD (gastroesophageal reflux disease)   . Osteoporosis   . Other specified cardiac dysrhythmias(427.89) 09/17/2010  . OSTEOPOROSIS 08/13/2006  . MEASLES, HX OF 01/28/2011  . HYPERLIPIDEMIA 12/17/2007  . GERD 12/28/2007  . HTN (hypertension) 03/06/2011  . DIVERTICULOSIS, COLON 01/28/2011  . CONSTIPATION 01/28/2011  . CHICKENPOX, HX OF 01/28/2011  . ANEMIA, CHRONIC 08/13/2006  . Post menopausal problems 03/06/2011  . Lesion of external ear 03/06/2011  . Sinusitis 12/16/2011  . Hypercholesteremia 03/10/2012  . Grief reaction 03/10/2012  . Ejection fraction     EF 55%, echo, 2007  . Normal cardiac stress test     Nuclear, 2004, normal  . Arthritis     left knee  . Atrophic vaginitis 10/03/2013    Past Surgical History  Procedure Laterality Date  . Abdominal hysterectomy      w/ unilateral oophorectomy-bleeding, ovarian cysts, left ovary left in place?  . Breast surgery      Implants  . Right knee arthroscopy, cleaned      Family History  Problem  Relation Age of Onset  . Stroke Mother   . Hypertension Mother   . Other Mother     CHF  . Transient ischemic attack Mother   . Cancer Mother 42    cervical  . Cancer Father     stomach, smoker  . Cancer Sister     brain  . Lymphoma Brother   . Stroke Daughter     ? stroke  . Mitral valve prolapse Daughter   . Irritable bowel syndrome Daughter   . Migraines Daughter   . Hypertension Son   . Cancer Paternal Aunt     cancer  . Cancer Paternal Uncle     X 2 CA  . Heart attack Paternal Uncle   . Other Maternal Grandmother     CHF  . Alcohol abuse Brother   . Heart attack Brother     smoker    History   Social History  . Marital Status: Married    Spouse Name: N/A    Number of Children: N/A  . Years of Education: N/A   Occupational History  . Not on file.   Social History Main Topics  . Smoking status: Never Smoker   . Smokeless tobacco: Never Used  . Alcohol Use: No  . Drug Use: No  . Sexual Activity: No   Other Topics Concern  . Not on file   Social  History Narrative  . No narrative on file    Current Outpatient Prescriptions on File Prior to Visit  Medication Sig Dispense Refill  . ALPRAZolam (XANAX) 0.5 MG tablet Take 1 tablet (0.5 mg total) by mouth as needed for sleep or anxiety. 1/2 to 2 tabs po bid prn for anxiety, insomnia  90 tablet  0  . aspirin 81 MG tablet Take 81 mg by mouth daily.        Marland Kitchen atorvastatin (LIPITOR) 10 MG tablet Take 1 tablet (10 mg total) by mouth daily.  90 tablet  1  . Calcium Carbonate-Vitamin D (CALTRATE 600+D) 600-400 MG-UNIT per tablet Take 1 tablet by mouth 2 (two) times daily.        . Cholecalciferol (VITAMIN D) 1000 UNITS capsule Take 1,000 Units by mouth daily.        Marland Kitchen diltiazem (CARDIZEM) 120 MG tablet Take 1 tablet (120 mg total) by mouth daily.  90 tablet  3  . estrogens, conjugated, (PREMARIN) 0.3 MG tablet Take 1 tablet (0.3 mg total) by mouth as needed.  90 tablet  0  . fish oil-omega-3 fatty acids 1000 MG  capsule Take 2 g by mouth 3 (three) times daily.        . Lutein 6 MG CAPS Take 1 capsule by mouth daily.        . metoprolol succinate (TOPROL-XL) 50 MG 24 hr tablet Take 1 tablet (50 mg total) by mouth daily.  90 tablet  2  . polyethylene glycol powder (MIRALAX) powder Take 17 g by mouth daily as needed.  255 g  0  . Probiotic Product (PEARLS IC PO) Take by mouth.       No current facility-administered medications on file prior to visit.    No Known Allergies  Review of Systems  Review of Systems  Constitutional: Negative for fever and malaise/fatigue.  HENT: Negative for congestion.   Eyes: Negative for discharge.  Respiratory: Negative for shortness of breath.   Cardiovascular: Negative for chest pain, palpitations and leg swelling.  Gastrointestinal: Positive for constipation. Negative for nausea, abdominal pain and diarrhea.  Genitourinary: Positive for frequency. Negative for dysuria.  Musculoskeletal: Negative for falls.  Skin: Negative for rash.  Neurological: Negative for loss of consciousness and headaches.  Endo/Heme/Allergies: Negative for polydipsia.  Psychiatric/Behavioral: Negative for depression and suicidal ideas. The patient is not nervous/anxious and does not have insomnia.     Objective  BP 148/74  Pulse 69  Temp(Src) 97.4 F (36.3 C) (Oral)  Ht 5\' 3"  (1.6 m)  Wt 122 lb 1.9 oz (55.393 kg)  BMI 21.64 kg/m2  SpO2 98%  Physical Exam  Physical Exam  Constitutional: She is oriented to person, place, and time and well-developed, well-nourished, and in no distress. No distress.  HENT:  Head: Normocephalic and atraumatic.  Eyes: Conjunctivae are normal.  Neck: Neck supple. No thyromegaly present.  Cardiovascular: Normal rate, regular rhythm and normal heart sounds.   No murmur heard. Pulmonary/Chest: Effort normal and breath sounds normal. She has no wheezes.  Abdominal: She exhibits no distension and no mass.  Genitourinary:  Urethral opening  stenosed, no lesions or masses noted.  Musculoskeletal: She exhibits no edema.  Lymphadenopathy:    She has no cervical adenopathy.  Neurological: She is alert and oriented to person, place, and time.  Skin: Skin is warm and dry. No rash noted. She is not diaphoretic.  Psychiatric: Memory, affect and judgment normal.    Lab Results  Component Value  Date   TSH 2.859 09/30/2013   Lab Results  Component Value Date   WBC 5.6 09/30/2013   HGB 11.4* 09/30/2013   HCT 33.7* 09/30/2013   MCV 86.9 09/30/2013   PLT 225 09/30/2013   Lab Results  Component Value Date   CREATININE 0.79 09/30/2013   BUN 22 09/30/2013   NA 140 09/30/2013   K 3.9 09/30/2013   CL 103 09/30/2013   CO2 31 09/30/2013   Lab Results  Component Value Date   ALT 10 09/30/2013   AST 17 09/30/2013   ALKPHOS 66 09/30/2013   BILITOT 0.4 09/30/2013   Lab Results  Component Value Date   CHOL 184 09/30/2013   Lab Results  Component Value Date   HDL 73 09/30/2013   Lab Results  Component Value Date   LDLCALC 92 09/30/2013   Lab Results  Component Value Date   TRIG 94 09/30/2013   Lab Results  Component Value Date   CHOLHDL 2.5 09/30/2013     Assessment & Plan  HTN (hypertension) Well controlled on recheck  SVT (supraventricular tachycardia) No concerns on exam today  Atrophic vaginitis Some stenosis noted at urethral opening, will try low dose Premarin cream topically to this area once a week, if no response to urology

## 2013-10-27 ENCOUNTER — Encounter: Payer: Self-pay | Admitting: Family Medicine

## 2013-11-02 ENCOUNTER — Ambulatory Visit (INDEPENDENT_AMBULATORY_CARE_PROVIDER_SITE_OTHER): Payer: Medicare Other | Admitting: Family Medicine

## 2013-11-02 ENCOUNTER — Encounter: Payer: Self-pay | Admitting: Family Medicine

## 2013-11-02 ENCOUNTER — Ambulatory Visit (HOSPITAL_BASED_OUTPATIENT_CLINIC_OR_DEPARTMENT_OTHER)
Admission: RE | Admit: 2013-11-02 | Discharge: 2013-11-02 | Disposition: A | Discharge status: Home / Self Care | Payer: Medicare Other | Source: Ambulatory Visit | Attending: Family Medicine | Admitting: Family Medicine

## 2013-11-02 VITALS — BP 118/78 | HR 61 | Temp 97.7°F | Ht 63.0 in | Wt 123.0 lb

## 2013-11-02 DIAGNOSIS — J019 Acute sinusitis, unspecified: Secondary | ICD-10-CM

## 2013-11-02 DIAGNOSIS — R519 Headache, unspecified: Secondary | ICD-10-CM | POA: Insufficient documentation

## 2013-11-02 DIAGNOSIS — I10 Essential (primary) hypertension: Secondary | ICD-10-CM

## 2013-11-02 DIAGNOSIS — R51 Headache: Secondary | ICD-10-CM

## 2013-11-02 DIAGNOSIS — J329 Chronic sinusitis, unspecified: Secondary | ICD-10-CM

## 2013-11-02 DIAGNOSIS — G319 Degenerative disease of nervous system, unspecified: Secondary | ICD-10-CM | POA: Insufficient documentation

## 2013-11-02 HISTORY — DX: Acute sinusitis, unspecified: J01.90

## 2013-11-02 MED ORDER — METHYLPREDNISOLONE (PAK) 4 MG PO TABS: ORAL_TABLET | ORAL | Status: DC

## 2013-11-02 MED ORDER — CEFDINIR 300 MG PO CAPS: 300.0000 mg | ORAL_CAPSULE | Freq: Two times a day (BID) | ORAL | Status: AC

## 2013-11-02 NOTE — Assessment & Plan Note (Signed)
With some vertigo symptoms, patient very anxious due to sidter that died of brain tumor. Will proceed with ct head

## 2013-11-02 NOTE — Assessment & Plan Note (Signed)
Started on antibiotics, mucinex and probiotics.  

## 2013-11-02 NOTE — Progress Notes (Signed)
Pre visit review using our clinic review tool, if applicable. No additional management support is needed unless otherwise documented below in the visit note. 

## 2013-11-02 NOTE — Assessment & Plan Note (Signed)
Well controlled, no changes 

## 2013-11-02 NOTE — Assessment & Plan Note (Signed)
Persistent symptoms, will treat with 2 week course of Cefdinir, medrol dosepak, Mucinex and nasal saline if no improvement will consider referral to ENT and/or neuro given the vertigo symptoms

## 2013-11-02 NOTE — Patient Instructions (Signed)
Hydrate Nasal saline flush twice a day  Sinusitis Sinusitis is redness, soreness, and swelling (inflammation) of the paranasal sinuses. Paranasal sinuses are air pockets within the bones of your face (beneath the eyes, the middle of the forehead, or above the eyes). In healthy paranasal sinuses, mucus is able to drain out, and air is able to circulate through them by way of your nose. However, when your paranasal sinuses are inflamed, mucus and air can become trapped. This can allow bacteria and other germs to grow and cause infection. Sinusitis can develop quickly and last only a short time (acute) or continue over a long period (chronic). Sinusitis that lasts for more than 12 weeks is considered chronic.  CAUSES  Causes of sinusitis include:  Allergies.  Structural abnormalities, such as displacement of the cartilage that separates your nostrils (deviated septum), which can decrease the air flow through your nose and sinuses and affect sinus drainage.  Functional abnormalities, such as when the small hairs (cilia) that line your sinuses and help remove mucus do not work properly or are not present. SYMPTOMS  Symptoms of acute and chronic sinusitis are the same. The primary symptoms are pain and pressure around the affected sinuses. Other symptoms include:  Upper toothache.  Earache.  Headache.  Bad breath.  Decreased sense of smell and taste.  A cough, which worsens when you are lying flat.  Fatigue.  Fever.  Thick drainage from your nose, which often is green and may contain pus (purulent).  Swelling and warmth over the affected sinuses. DIAGNOSIS  Your caregiver will perform a physical exam. During the exam, your caregiver may:  Look in your nose for signs of abnormal growths in your nostrils (nasal polyps).  Tap over the affected sinus to check for signs of infection.  View the inside of your sinuses (endoscopy) with a special imaging device with a light attached  (endoscope), which is inserted into your sinuses. If your caregiver suspects that you have chronic sinusitis, one or more of the following tests may be recommended:  Allergy tests.  Nasal culture A sample of mucus is taken from your nose and sent to a lab and screened for bacteria.  Nasal cytology A sample of mucus is taken from your nose and examined by your caregiver to determine if your sinusitis is related to an allergy. TREATMENT  Most cases of acute sinusitis are related to a viral infection and will resolve on their own within 10 days. Sometimes medicines are prescribed to help relieve symptoms (pain medicine, decongestants, nasal steroid sprays, or saline sprays).  However, for sinusitis related to a bacterial infection, your caregiver will prescribe antibiotic medicines. These are medicines that will help kill the bacteria causing the infection.  Rarely, sinusitis is caused by a fungal infection. In theses cases, your caregiver will prescribe antifungal medicine. For some cases of chronic sinusitis, surgery is needed. Generally, these are cases in which sinusitis recurs more than 3 times per year, despite other treatments. HOME CARE INSTRUCTIONS   Drink plenty of water. Water helps thin the mucus so your sinuses can drain more easily.  Use a humidifier.  Inhale steam 3 to 4 times a day (for example, sit in the bathroom with the shower running).  Apply a warm, moist washcloth to your face 3 to 4 times a day, or as directed by your caregiver.  Use saline nasal sprays to help moisten and clean your sinuses.  Take over-the-counter or prescription medicines for pain, discomfort, or fever only  as directed by your caregiver. SEEK IMMEDIATE MEDICAL CARE IF:  You have increasing pain or severe headaches.  You have nausea, vomiting, or drowsiness.  You have swelling around your face.  You have vision problems.  You have a stiff neck.  You have difficulty breathing. MAKE SURE  YOU:   Understand these instructions.  Will watch your condition.  Will get help right away if you are not doing well or get worse. Document Released: 10/28/2005 Document Revised: 01/20/2012 Document Reviewed: 11/12/2011 [facility] Patient Information 2014 [place], [place].

## 2013-11-02 NOTE — Progress Notes (Signed)
Patient ID: Karina [name], female   DOB: 02-Mar-1933, 79 y.o.   MRN: [id number] Karina [name] [id number] Jun 14, 1933 11/02/2013      Progress Note-Follow Up  Subjective  Chief Complaint  Chief Complaint  Patient presents with  . Sinusitis    X 1 week, headache and sinus pressure    HPI  Is in today complaining of persistent headache and congestion. She has been treated without complete response in past. Is complaining of facial pressure and pain and tearing and discomfort in left ear. Nasal congestion is occasionally productive of sputum. She is headache and notes pain in the face of his also noting pain behind ears and occiput. Describes a sense of disequilibrium which occur several times a day. Not always related to position changes. Note the woozy versus her vertiginous symptom. Denies any visual changes. No recent fevers or chills malaise or myalgias. No nausea vomiting or GI complaints noted. No chest pain, palpitations, chest congestion or shortness of breath. Has been trying some phenylephrine over-the-counter with no good results.  Past Medical History  Diagnosis Date  . SVT (supraventricular tachycardia) 2007    3-4 beats.. Holter  . Sinus tachycardia     mild-- retesting  . Fatigue 1/11    w/ exercise.. responded to increased beta blocker dose  . Dyslipidemia   . Anemia     iron deficiency  . GERD (gastroesophageal reflux disease)   . Osteoporosis   . Other specified cardiac dysrhythmias(427.89) 09/17/2010  . OSTEOPOROSIS 08/13/2006  . MEASLES, HX OF 01/28/2011  . HYPERLIPIDEMIA 12/17/2007  . GERD 12/28/2007  . HTN (hypertension) 03/06/2011  . DIVERTICULOSIS, COLON 01/28/2011  . CONSTIPATION 01/28/2011  . CHICKENPOX, HX OF 01/28/2011  . ANEMIA, CHRONIC 08/13/2006  . Post menopausal problems 03/06/2011  . Lesion of external ear 03/06/2011  . Sinusitis 12/16/2011  . Hypercholesteremia 03/10/2012  . Grief reaction 03/10/2012  . Ejection fraction     EF 55%, echo, 2007  .  Normal cardiac stress test     Nuclear, 2004, normal  . Arthritis     left knee  . Atrophic vaginitis 10/03/2013  . Sinusitis, acute 11/02/2013    Past Surgical History  Procedure Laterality Date  . Abdominal hysterectomy      w/ unilateral oophorectomy-bleeding, ovarian cysts, left ovary left in place?  . Breast surgery      Implants  . Right knee arthroscopy, cleaned      Family History  Problem Relation Age of Onset  . Stroke Mother   . Hypertension Mother   . Other Mother     CHF  . Transient ischemic attack Mother   . Cancer Mother 58    cervical  . Cancer Father     stomach, smoker  . Cancer Sister     brain  . Lymphoma Brother   . Stroke Daughter     ? stroke  . Mitral valve prolapse Daughter   . Irritable bowel syndrome Daughter   . Migraines Daughter   . Hypertension Son   . Cancer Paternal Aunt     cancer  . Cancer Paternal Uncle     X 2 CA  . Heart attack Paternal Uncle   . Other Maternal Grandmother     CHF  . Alcohol abuse Brother   . Heart attack Brother     smoker    History   Social History  . Marital Status: Married    Spouse Name: N/A    Number of Children:  N/A  . Years of Education: N/A   Occupational History  . Not on file.   Social History Main Topics  . Smoking status: Never Smoker   . Smokeless tobacco: Never Used  . Alcohol Use: No  . Drug Use: No  . Sexual Activity: No   Other Topics Concern  . Not on file   Social History Narrative  . No narrative on file    Current Outpatient Prescriptions on File Prior to Visit  Medication Sig Dispense Refill  . ALPRAZolam (XANAX) 0.5 MG tablet Take 1 tablet (0.5 mg total) by mouth as needed for sleep or anxiety. 1/2 to 2 tabs po bid prn for anxiety, insomnia  90 tablet  0  . aspirin 81 MG tablet Take 81 mg by mouth daily.        . Calcium Carbonate-Vitamin D (CALTRATE 600+D) 600-400 MG-UNIT per tablet Take 1 tablet by mouth 2 (two) times daily.        . Cholecalciferol  (VITAMIN D) 1000 UNITS capsule Take 1,000 Units by mouth daily.        Marland Kitchen conjugated estrogens (PREMARIN) vaginal cream Place 1 Applicatorful vaginally once a week.  42.5 g  1  . diltiazem (CARDIZEM) 120 MG tablet Take 1 tablet (120 mg total) by mouth daily.  90 tablet  3  . estrogens, conjugated, (PREMARIN) 0.3 MG tablet Take 1 tablet (0.3 mg total) by mouth as needed.  90 tablet  0  . fish oil-omega-3 fatty acids 1000 MG capsule Take 2 g by mouth 3 (three) times daily.        . Lutein 6 MG CAPS Take 1 capsule by mouth daily.        . metoprolol succinate (TOPROL-XL) 50 MG 24 hr tablet Take 1 tablet (50 mg total) by mouth daily.  90 tablet  2  . polyethylene glycol powder (MIRALAX) powder Take 17 g by mouth daily as needed.  255 g  0  . Probiotic Product (PEARLS IC PO) Take by mouth.      Marland Kitchen atorvastatin (LIPITOR) 10 MG tablet Take 1 tablet (10 mg total) by mouth daily.  90 tablet  1   No current facility-administered medications on file prior to visit.    No Known Allergies  Review of Systems  Review of Systems  Constitutional: Negative for fever and malaise/fatigue.  HENT: Positive for congestion, ear pain, hearing loss, nosebleeds and sore throat.   Eyes: Negative for discharge.  Respiratory: Negative for shortness of breath.   Cardiovascular: Negative for chest pain, palpitations and leg swelling.  Gastrointestinal: Negative for nausea, abdominal pain and diarrhea.  Genitourinary: Negative for dysuria.  Musculoskeletal: Negative for falls.  Skin: Negative for rash.  Neurological: Positive for dizziness and headaches. Negative for loss of consciousness.  Endo/Heme/Allergies: Negative for polydipsia.  Psychiatric/Behavioral: Negative for depression and suicidal ideas. The patient is not nervous/anxious and does not have insomnia.     Objective  BP 118/78  Pulse 61  Temp(Src) 97.7 F (36.5 C) (Oral)  Ht 5\' 3"  (1.6 m)  Wt 123 lb 0.6 oz (55.811 kg)  BMI 21.80 kg/m2  SpO2  99%  Physical Exam  Physical Exam  Constitutional: She is oriented to person, place, and time and well-developed, well-nourished, and in no distress. No distress.  HENT:  Head: Normocephalic and atraumatic.  Mild erythema posterior oropharynx  Eyes: Conjunctivae are normal.  Neck: Neck supple. No thyromegaly present.  Cardiovascular: Normal rate, regular rhythm and normal heart sounds.  No murmur heard. Pulmonary/Chest: Effort normal and breath sounds normal. She has no wheezes.  Abdominal: She exhibits no distension and no mass.  Musculoskeletal: She exhibits no edema.  Lymphadenopathy:    She has no cervical adenopathy.  Neurological: She is alert and oriented to person, place, and time.  Skin: Skin is warm and dry. No rash noted. She is not diaphoretic.  Psychiatric: Memory, affect and judgment normal.    Lab Results  Component Value Date   TSH 2.859 09/30/2013   Lab Results  Component Value Date   WBC 5.6 09/30/2013   HGB 11.4* 09/30/2013   HCT 33.7* 09/30/2013   MCV 86.9 09/30/2013   PLT 225 09/30/2013   Lab Results  Component Value Date   CREATININE 0.79 09/30/2013   BUN 22 09/30/2013   NA 140 09/30/2013   K 3.9 09/30/2013   CL 103 09/30/2013   CO2 31 09/30/2013   Lab Results  Component Value Date   ALT 10 09/30/2013   AST 17 09/30/2013   ALKPHOS 66 09/30/2013   BILITOT 0.4 09/30/2013   Lab Results  Component Value Date   CHOL 184 09/30/2013   Lab Results  Component Value Date   HDL 73 09/30/2013   Lab Results  Component Value Date   LDLCALC 92 09/30/2013   Lab Results  Component Value Date   TRIG 94 09/30/2013   Lab Results  Component Value Date   CHOLHDL 2.5 09/30/2013     Assessment & Plan  HTN (hypertension) Well controlled, no changes  Sinusitis, acute Started on antibiotics, mucinex and probiotics  Sinusitis Persistent symptoms, will treat with 2 week course of Cefdinir, medrol dosepak, Mucinex and nasal saline if no  improvement will consider referral to ENT and/or neuro given the vertigo symptoms  Headache(784.0) With some vertigo symptoms, patient very anxious due to sidter that died of brain tumor. Will proceed with ct head

## 2013-11-23 ENCOUNTER — Other Ambulatory Visit: Payer: Self-pay

## 2013-11-23 MED ORDER — DILTIAZEM HCL 120 MG PO TABS: 120.0000 mg | ORAL_TABLET | Freq: Every day | ORAL | Status: DC

## 2013-11-23 MED ORDER — METOPROLOL SUCCINATE ER 50 MG PO TB24: 50.0000 mg | ORAL_TABLET | Freq: Every day | ORAL | Status: DC

## 2014-01-20 ENCOUNTER — Telehealth: Payer: Self-pay | Admitting: Family Medicine

## 2014-01-20 ENCOUNTER — Other Ambulatory Visit: Payer: Self-pay | Admitting: Family Medicine

## 2014-01-20 DIAGNOSIS — H547 Unspecified visual loss: Secondary | ICD-10-CM

## 2014-01-20 NOTE — Telephone Encounter (Signed)
Patient states that she now has [employer](ID [id number]) and needs a referral to see her eye doctor. Dr. Sabra Heck with [facility]

## 2014-01-25 ENCOUNTER — Telehealth: Payer: Self-pay | Admitting: Family Medicine

## 2014-01-25 NOTE — Telephone Encounter (Signed)
Patient currently is seeing Dr. Ron [name] but due to insurance patient states that she now needs referral to see him. Please place referral.

## 2014-01-25 NOTE — Telephone Encounter (Signed)
NO problem but what is his subspecialty?

## 2014-01-26 ENCOUNTER — Ambulatory Visit (INDEPENDENT_AMBULATORY_CARE_PROVIDER_SITE_OTHER): Payer: Commercial Managed Care - HMO | Admitting: Cardiology

## 2014-01-26 ENCOUNTER — Encounter: Payer: Self-pay | Admitting: Cardiology

## 2014-01-26 VITALS — BP 138/80 | HR 65 | Ht 63.0 in | Wt 124.8 lb

## 2014-01-26 DIAGNOSIS — I471 Supraventricular tachycardia: Secondary | ICD-10-CM

## 2014-01-26 DIAGNOSIS — I498 Other specified cardiac arrhythmias: Secondary | ICD-10-CM

## 2014-01-26 DIAGNOSIS — I10 Essential (primary) hypertension: Secondary | ICD-10-CM

## 2014-01-26 NOTE — Progress Notes (Signed)
HPI  Patient is seen today for her overall followup. She also called to be seen sooner because of feeling palpitations. In the past she had some mild sinus tachycardia. On a Holter monitor she had 4 beats of supraventricular tachycardia. She has never had documented sustained supraventricular arrhythmias. Historically she can take an extra dose of metoprolol if she feels and increased heart rate. Recently she's felt a thumping sensation very infrequently at night time. This might be associated with feeling her heart rate increased for a period of time. She takes her metoprolol. This stabilizes. She goes about full activities with no limitations.  I saw her last in the office in November, 2013. No Known Allergies  Current Outpatient Prescriptions  Medication Sig Dispense Refill  . ALPRAZolam (XANAX) 0.5 MG tablet Take 1 tablet (0.5 mg total) by mouth as needed for sleep or anxiety. 1/2 to 2 tabs po bid prn for anxiety, insomnia  90 tablet  0  . aspirin 81 MG tablet Take 81 mg by mouth daily.        Marland Kitchen BIOTIN PO Take 1 tablet by mouth daily.      . Calcium Carbonate-Vitamin D (CALTRATE 600+D) 600-400 MG-UNIT per tablet Take 1 tablet by mouth 2 (two) times daily.        . Cholecalciferol (VITAMIN D) 1000 UNITS capsule Take 1,000 Units by mouth daily.        Marland Kitchen diltiazem (CARDIZEM) 120 MG tablet Take 1 tablet (120 mg total) by mouth daily.  90 tablet  3  . diphenhydramine-acetaminophen (TYLENOL PM) 25-500 MG TABS Take 1 tablet by mouth at bedtime as needed.      Marland Kitchen estrogens, conjugated, (PREMARIN) 0.3 MG tablet Take 1 tablet (0.3 mg total) by mouth as needed.  90 tablet  0  . fish oil-omega-3 fatty acids 1000 MG capsule Take 2 g by mouth 3 (three) times daily.        . Lutein 6 MG CAPS Take 1 capsule by mouth daily.        . metoprolol succinate (TOPROL-XL) 50 MG 24 hr tablet Take 1 tablet (50 mg total) by mouth daily.  90 tablet  2  . Plant Sterols and Stanols (CHOLESTOFF) 450 MG TABS Take 2  tablets by mouth daily.      . polyethylene glycol powder (MIRALAX) powder Take 17 g by mouth daily as needed.  255 g  0  . Probiotic Product (PEARLS IC PO) Take by mouth.       No current facility-administered medications for this visit.    History   Social History  . Marital Status: Married    Spouse Name: N/A    Number of Children: N/A  . Years of Education: N/A   Occupational History  . Not on file.   Social History Main Topics  . Smoking status: Never Smoker   . Smokeless tobacco: Never Used  . Alcohol Use: No  . Drug Use: No  . Sexual Activity: No   Other Topics Concern  . Not on file   Social History Narrative  . No narrative on file    Family History  Problem Relation Age of Onset  . Stroke Mother   . Hypertension Mother   . Other Mother     CHF  . Transient ischemic attack Mother   . Cancer Mother 27    cervical  . Cancer Father     stomach, smoker  . Cancer Sister     brain  .  Lymphoma Brother   . Stroke Daughter     ? stroke  . Mitral valve prolapse Daughter   . Irritable bowel syndrome Daughter   . Migraines Daughter   . Hypertension Son   . Cancer Paternal Aunt     cancer  . Cancer Paternal Uncle     X 2 CA  . Heart attack Paternal Uncle   . Other Maternal Grandmother     CHF  . Alcohol abuse Brother   . Heart attack Brother     smoker    Past Medical History  Diagnosis Date  . SVT (supraventricular tachycardia) 2007    3-4 beats.. Holter  . Sinus tachycardia     mild-- retesting  . Fatigue 1/11    w/ exercise.. responded to increased beta blocker dose  . Dyslipidemia   . Anemia     iron deficiency  . GERD (gastroesophageal reflux disease)   . Osteoporosis   . Other specified cardiac dysrhythmias(427.89) 09/17/2010  . OSTEOPOROSIS 08/13/2006  . MEASLES, HX OF 01/28/2011  . HYPERLIPIDEMIA 12/17/2007  . GERD 12/28/2007  . HTN (hypertension) 03/06/2011  . DIVERTICULOSIS, COLON 01/28/2011  . CONSTIPATION 01/28/2011  . CHICKENPOX, HX  OF 01/28/2011  . ANEMIA, CHRONIC 08/13/2006  . Post menopausal problems 03/06/2011  . Lesion of external ear 03/06/2011  . Sinusitis 12/16/2011  . Hypercholesteremia 03/10/2012  . Grief reaction 03/10/2012  . Ejection fraction     EF 55%, echo, 2007  . Normal cardiac stress test     Nuclear, 2004, normal  . Arthritis     left knee  . Atrophic vaginitis 10/03/2013  . Sinusitis, acute 11/02/2013    Past Surgical History  Procedure Laterality Date  . Abdominal hysterectomy      w/ unilateral oophorectomy-bleeding, ovarian cysts, left ovary left in place?  . Breast surgery      Implants  . Right knee arthroscopy, cleaned      Patient Active Problem List   Diagnosis Date Noted  . Sinusitis, acute 11/02/2013  . Headache(784.0) 11/02/2013  . Atrophic vaginitis 10/03/2013  . Vaginal discomfort 08/10/2013  . Knee pain, bilateral 02/02/2013  . Injury of adductor muscle and tendon of thigh 01/01/2013  . Pes anserine bursitis 01/01/2013  . Ejection fraction   . SVT (supraventricular tachycardia)   . Sinus tachycardia   . Normal cardiac stress test   . Grief reaction 03/10/2012  . Sinusitis 12/16/2011  . HTN (hypertension) 03/06/2011  . Postmenopausal status 03/06/2011  . Lesion of external ear 03/06/2011  . DIVERTICULOSIS, COLON 01/28/2011  . CONSTIPATION 01/28/2011  . MEASLES, HX OF 01/28/2011  . CHICKENPOX, HX OF 01/28/2011  . GERD 12/28/2007  . HYPERLIPIDEMIA 12/17/2007  . ANEMIA, CHRONIC 08/13/2006  . OSTEOPOROSIS 08/13/2006    ROS   Patient denies fever, chills, headache, sweats, rash, change in vision, change in hearing, chest pain, cough, nausea or vomiting, urinary symptoms. All other systems are reviewed and are negative.  PHYSICAL EXAM  Patient is oriented to person time and place. Affect is normal. There is no jugular venous distention. Lungs are clear. Respiratory effort is nonlabored. Cardiac exam reveals S1 and S2. There no clicks or significant murmurs. Abdomen is  soft. There is no peripheral edema. There are no musculoskeletal deformities. There are no skin rashes.  Filed Vitals:   01/26/14 1015  BP: 138/80  Pulse: 65  Height: 5\' 3"  (1.6 m)  Weight: 124 lb 12.8 oz (56.609 kg)   EKG is done today and reviewed  by me. There is normal sinus rhythm. There are no significant abnormalities. There is no significant change from the past.  ASSESSMENT & PLAN

## 2014-01-26 NOTE — Patient Instructions (Signed)
**Note De-identified Karina [name] Obfuscation** Your physician recommends that you continue on your current medications as directed. Please refer to the Current Medication list given to you today.  Your physician wants you to follow-up in: 1 year. You will receive a reminder letter in the mail two months in advance. If you don't receive a letter, please call our office to schedule the follow-up appointment.  

## 2014-01-26 NOTE — Assessment & Plan Note (Signed)
The patient has felt some increased heart beat at night. She's also noted a thumping sensation that occurs from time to time. I explained to her that the thumping sensation is probably a stronger beat after a premature beat. She seemed to understand this. I reassured her. I feel that she is stable. I feel that she does not require any further workup at this time. If she has increased symptoms, we can proceed with further monitoring or adjustment in her medications. I feel this is not necessary at this time.

## 2014-01-26 NOTE — Assessment & Plan Note (Signed)
Blood pressure is controlled. No change in therapy. 

## 2014-01-27 ENCOUNTER — Other Ambulatory Visit: Payer: Self-pay | Admitting: Family Medicine

## 2014-01-27 DIAGNOSIS — I471 Supraventricular tachycardia, unspecified: Secondary | ICD-10-CM

## 2014-01-27 NOTE — Telephone Encounter (Signed)
Patient states that Dr. Ron [name] is a cardiologist with [facility] heart care.

## 2014-05-05 ENCOUNTER — Encounter: Payer: Medicare Other | Admitting: Family Medicine

## 2014-05-09 ENCOUNTER — Other Ambulatory Visit: Payer: Self-pay | Admitting: Family Medicine

## 2014-05-09 ENCOUNTER — Encounter: Payer: Self-pay | Admitting: Family Medicine

## 2014-05-09 ENCOUNTER — Ambulatory Visit (HOSPITAL_BASED_OUTPATIENT_CLINIC_OR_DEPARTMENT_OTHER)
Admission: RE | Admit: 2014-05-09 | Discharge: 2014-05-09 | Disposition: A | Discharge status: Home / Self Care | Payer: Medicare PPO | Source: Ambulatory Visit | Attending: Family Medicine | Admitting: Family Medicine

## 2014-05-09 ENCOUNTER — Ambulatory Visit (INDEPENDENT_AMBULATORY_CARE_PROVIDER_SITE_OTHER): Payer: Medicare PPO | Admitting: Family Medicine

## 2014-05-09 VITALS — BP 130/64 | HR 66 | Temp 98.2°F | Ht 63.0 in | Wt 122.0 lb

## 2014-05-09 DIAGNOSIS — M47812 Spondylosis without myelopathy or radiculopathy, cervical region: Secondary | ICD-10-CM | POA: Insufficient documentation

## 2014-05-09 DIAGNOSIS — F4320 Adjustment disorder, unspecified: Secondary | ICD-10-CM

## 2014-05-09 DIAGNOSIS — D509 Iron deficiency anemia, unspecified: Secondary | ICD-10-CM

## 2014-05-09 DIAGNOSIS — M542 Cervicalgia: Secondary | ICD-10-CM

## 2014-05-09 DIAGNOSIS — K219 Gastro-esophageal reflux disease without esophagitis: Secondary | ICD-10-CM

## 2014-05-09 DIAGNOSIS — G8929 Other chronic pain: Secondary | ICD-10-CM

## 2014-05-09 DIAGNOSIS — M810 Age-related osteoporosis without current pathological fracture: Secondary | ICD-10-CM

## 2014-05-09 DIAGNOSIS — H9190 Unspecified hearing loss, unspecified ear: Secondary | ICD-10-CM

## 2014-05-09 DIAGNOSIS — E785 Hyperlipidemia, unspecified: Secondary | ICD-10-CM

## 2014-05-09 DIAGNOSIS — I498 Other specified cardiac arrhythmias: Secondary | ICD-10-CM

## 2014-05-09 DIAGNOSIS — F4321 Adjustment disorder with depressed mood: Secondary | ICD-10-CM

## 2014-05-09 DIAGNOSIS — I471 Supraventricular tachycardia, unspecified: Secondary | ICD-10-CM

## 2014-05-09 DIAGNOSIS — M899 Disorder of bone, unspecified: Secondary | ICD-10-CM

## 2014-05-09 DIAGNOSIS — H9193 Unspecified hearing loss, bilateral: Secondary | ICD-10-CM

## 2014-05-09 DIAGNOSIS — I10 Essential (primary) hypertension: Secondary | ICD-10-CM

## 2014-05-09 DIAGNOSIS — Z0000 Encounter for general adult medical examination without abnormal findings: Secondary | ICD-10-CM

## 2014-05-09 HISTORY — DX: Encounter for general adult medical examination without abnormal findings: Z00.00

## 2014-05-09 LAB — CBC
HEMATOCRIT: 36.2 % (ref 36.0–46.0)
Hemoglobin: 12.2 g/dL (ref 12.0–15.0)
MCH: 28.6 pg (ref 26.0–34.0)
MCHC: 33.7 g/dL (ref 30.0–36.0)
MCV: 84.8 fL (ref 78.0–100.0)
PLATELETS: 233 10*3/uL (ref 150–400)
RBC: 4.27 MIL/uL (ref 3.87–5.11)
RDW: 12.9 % (ref 11.5–15.5)
WBC: 7.3 10*3/uL (ref 4.0–10.5)

## 2014-05-09 NOTE — Assessment & Plan Note (Addendum)
Pain is intermittent xray has some lucent lesions at occiput. SPEP norma, consider CT scan if patient agrees.

## 2014-05-09 NOTE — Progress Notes (Signed)
Pre visit review using our clinic review tool, if applicable. No additional management support is needed unless otherwise documented below in the visit note. 

## 2014-05-09 NOTE — Assessment & Plan Note (Signed)
Avoid offending foods, start probiotics. Do not eat large meals in late evening and consider raising head of bed.  

## 2014-05-09 NOTE — Patient Instructions (Addendum)
[facility]  Hypertension Hypertension, commonly called high blood pressure, is when the force of blood pumping through your arteries is too strong. Your arteries are the blood vessels that carry blood from your heart throughout your body. A blood pressure reading consists of a higher number over a lower number, such as 110/72. The higher number (systolic) is the pressure inside your arteries when your heart pumps. The lower number (diastolic) is the pressure inside your arteries when your heart relaxes. Ideally you want your blood pressure below 120/80. Hypertension forces your heart to work harder to pump blood. Your arteries may become narrow or stiff. Having hypertension puts you at risk for heart disease, stroke, and other problems.  RISK FACTORS Some risk factors for high blood pressure are controllable. Others are not.  Risk factors you cannot control include:   Race. You may be at higher risk if you are African American.  Age. Risk increases with age.  Gender. Men are at higher risk than women before age 39 years. After age 60, women are at higher risk than men. Risk factors you can control include:  Not getting enough exercise or physical activity.  Being overweight.  Getting too much fat, sugar, calories, or salt in your diet.  Drinking too much alcohol. SIGNS AND SYMPTOMS Hypertension does not usually cause signs or symptoms. Extremely high blood pressure (hypertensive crisis) may cause headache, anxiety, shortness of breath, and nosebleed. DIAGNOSIS  To check if you have hypertension, your health care Karina [name] will measure your blood pressure while you are seated, with your arm held at the level of your heart. It should be measured at least twice using the same arm. Certain conditions can cause a difference in blood pressure between your right and left arms. A blood pressure reading that is higher than normal on one occasion does not mean that you need treatment. If one blood  pressure reading is high, ask your health care Karina [name] about having it checked again. TREATMENT  Treating high blood pressure includes making lifestyle changes and possibly taking medication. Living a healthy lifestyle can help lower high blood pressure. You may need to change some of your habits. Lifestyle changes may include:  Following the DASH diet. This diet is high in fruits, vegetables, and whole grains. It is low in salt, red meat, and added sugars.  Getting at least 2 1/2 hours of brisk physical activity every week.  Losing weight if necessary.  Not smoking.  Limiting alcoholic beverages.  Learning ways to reduce stress. If lifestyle changes are not enough to get your blood pressure under control, your health care Karina [name] may prescribe medicine. You may need to take more than one. Work closely with your health care Karina [name] to understand the risks and benefits. HOME CARE INSTRUCTIONS  Have your blood pressure rechecked as directed by your health care Karina [name].   Only take medicine as directed by your health care Karina [name]. Follow the directions carefully. Blood pressure medicines must be taken as prescribed. The medicine does not work as well when you skip doses. Skipping doses also puts you at risk for problems.   Do not smoke.   Monitor your blood pressure at home as directed by your health care Karina [name]. SEEK MEDICAL CARE IF:   You think you are having a reaction to medicines taken.  You have recurrent headaches or feel dizzy.  You have swelling in your ankles.  You have trouble with your vision. SEEK IMMEDIATE MEDICAL CARE IF:  You develop a  severe headache or confusion.  You have unusual weakness, numbness, or feel faint.  You have severe chest or abdominal pain.  You vomit repeatedly.  You have trouble breathing. MAKE SURE YOU:   Understand these instructions.  Will watch your condition.  Will get help right away if you are not doing well or  get worse. Document Released: 10/28/2005 Document Revised: 11/02/2013 Document Reviewed: 08/20/2013 [facility] Patient Information 2015 [place], [place]. This information is not intended to replace advice given to you by your health care Karina [name]. Make sure you discuss any questions you have with your health care Karina [name].

## 2014-05-09 NOTE — Assessment & Plan Note (Signed)
Well controlled, no changes to meds. Encouraged heart healthy diet such as the DASH diet and exercise as tolerated.  °

## 2014-05-09 NOTE — Progress Notes (Signed)
Patient ID: Karina [name], female   DOB: 1933/05/28, 80 y.o.   MRN: [id number] Karina [name] [id number] 02-28-1933 05/09/2014      Progress Note-Follow Up  Subjective  Chief Complaint  Chief Complaint  Patient presents with  . Annual Exam    physical    HPI  Patient is a 80 year old female in today for routine medical care. Patient is in today for annual exam. She is struggling today because of-year anniversary of her daughter's death. Overall though she reports she's doing well. She is using hearing aids but infrequently uses the right one because no recent illness but she has trouble with constipation. Has to strain at times and reports a rectocele. No melanotic stool. Does struggle with some neck pain. I new pillow has been helpful. No recent illness. Is scheduled for mammogram next month.  Denies CP/palp/SOB/HA/congestion/fevers/GI or GU c/o. Taking meds as prescribed  Past Medical History  Diagnosis Date  . SVT (supraventricular tachycardia) 2007    3-4 beats.. Holter  . Sinus tachycardia     mild-- retesting  . Fatigue 1/11    w/ exercise.. responded to increased beta blocker dose  . Dyslipidemia   . Anemia     iron deficiency  . GERD (gastroesophageal reflux disease)   . Osteoporosis   . Other specified cardiac dysrhythmias(427.89) 09/17/2010  . OSTEOPOROSIS 08/13/2006  . MEASLES, HX OF 01/28/2011  . HYPERLIPIDEMIA 12/17/2007  . GERD 12/28/2007  . HTN (hypertension) 03/06/2011  . DIVERTICULOSIS, COLON 01/28/2011  . CONSTIPATION 01/28/2011  . CHICKENPOX, HX OF 01/28/2011  . ANEMIA, CHRONIC 08/13/2006  . Post menopausal problems 03/06/2011  . Lesion of external ear 03/06/2011  . Sinusitis 12/16/2011  . Hypercholesteremia 03/10/2012  . Grief reaction 03/10/2012  . Ejection fraction     EF 55%, echo, 2007  . Normal cardiac stress test     Nuclear, 2004, normal  . Arthritis     left knee  . Atrophic vaginitis 10/03/2013  . Sinusitis, acute 11/02/2013    Past Surgical  History  Procedure Laterality Date  . Abdominal hysterectomy      w/ unilateral oophorectomy-bleeding, ovarian cysts, left ovary left in place?  . Breast surgery      Implants  . Right knee arthroscopy, cleaned      Family History  Problem Relation Age of Onset  . Stroke Mother   . Hypertension Mother   . Other Mother     CHF  . Transient ischemic attack Mother   . Cancer Mother 1    cervical  . Cancer Father     stomach, smoker  . Cancer Sister     brain  . Lymphoma Brother   . Stroke Daughter     ? stroke  . Mitral valve prolapse Daughter   . Irritable bowel syndrome Daughter   . Migraines Daughter   . Hypertension Son   . Cancer Paternal Aunt     cancer  . Cancer Paternal Uncle     X 2 CA  . Heart attack Paternal Uncle   . Other Maternal Grandmother     CHF  . Alcohol abuse Brother   . Heart attack Brother     smoker    History   Social History  . Marital Status: Married    Spouse Name: N/A    Number of Children: N/A  . Years of Education: N/A   Occupational History  . Not on file.   Social History Main Topics  .  Smoking status: Never Smoker   . Smokeless tobacco: Never Used  . Alcohol Use: No  . Drug Use: No  . Sexual Activity: No   Other Topics Concern  . Not on file   Social History Narrative  . No narrative on file    Current Outpatient Prescriptions on File Prior to Visit  Medication Sig Dispense Refill  . ALPRAZolam (XANAX) 0.5 MG tablet Take 1 tablet (0.5 mg total) by mouth as needed for sleep or anxiety. 1/2 to 2 tabs po bid prn for anxiety, insomnia  90 tablet  0  . aspirin 81 MG tablet Take 81 mg by mouth daily.        Marland Kitchen BIOTIN PO Take 1 tablet by mouth daily.      . Calcium Carbonate-Vitamin D (CALTRATE 600+D) 600-400 MG-UNIT per tablet Take 1 tablet by mouth 2 (two) times daily.        . Cholecalciferol (VITAMIN D) 1000 UNITS capsule Take 1,000 Units by mouth daily.        Marland Kitchen diltiazem (CARDIZEM) 120 MG tablet Take 1 tablet (120  mg total) by mouth daily.  90 tablet  3  . diphenhydramine-acetaminophen (TYLENOL PM) 25-500 MG TABS Take 1 tablet by mouth at bedtime as needed.      . fish oil-omega-3 fatty acids 1000 MG capsule Take 2 g by mouth 3 (three) times daily.        . Lutein 6 MG CAPS Take 1 capsule by mouth daily.        . metoprolol succinate (TOPROL-XL) 50 MG 24 hr tablet Take 1 tablet (50 mg total) by mouth daily.  90 tablet  2  . Plant Sterols and Stanols (CHOLESTOFF) 450 MG TABS Take 2 tablets by mouth daily.      . polyethylene glycol powder (MIRALAX) powder Take 17 g by mouth daily as needed.  255 g  0  . Probiotic Product (PEARLS IC PO) Take by mouth.       No current facility-administered medications on file prior to visit.    No Known Allergies  Review of Systems  Review of Systems  Constitutional: Negative for fever and malaise/fatigue.  HENT: Negative for congestion.   Eyes: Negative for discharge.  Respiratory: Negative for shortness of breath.   Cardiovascular: Negative for chest pain, palpitations and leg swelling.  Gastrointestinal: Negative for nausea, abdominal pain and diarrhea.  Genitourinary: Negative for dysuria.  Musculoskeletal: Negative for falls.  Skin: Negative for rash.  Neurological: Negative for loss of consciousness and headaches.  Endo/Heme/Allergies: Negative for polydipsia.  Psychiatric/Behavioral: Negative for depression and suicidal ideas. The patient is not nervous/anxious and does not have insomnia.     Objective  BP 130/64  Pulse 66  Temp(Src) 98.2 F (36.8 C) (Oral)  Ht 5\' 3"  (1.6 m)  Wt 122 lb 0.6 oz (55.357 kg)  BMI 21.62 kg/m2  SpO2 98%  Physical Exam  Physical Exam  Constitutional: She is oriented to person, place, and time and well-developed, well-nourished, and in no distress. No distress.  HENT:  Head: Normocephalic and atraumatic.  Eyes: Conjunctivae are normal.  Neck: Neck supple. No thyromegaly present.  Cardiovascular: Normal rate,  regular rhythm and normal heart sounds.   No murmur heard. Pulmonary/Chest: Effort normal and breath sounds normal. She has no wheezes.  Abdominal: She exhibits no distension and no mass.  Musculoskeletal: She exhibits no edema.  Lymphadenopathy:    She has no cervical adenopathy.  Neurological: She is alert and oriented to  person, place, and time.  Skin: Skin is warm and dry. No rash noted. She is not diaphoretic.  Psychiatric: Memory, affect and judgment normal.    Lab Results  Component Value Date   TSH 2.859 09/30/2013   Lab Results  Component Value Date   WBC 5.6 09/30/2013   HGB 11.4* 09/30/2013   HCT 33.7* 09/30/2013   MCV 86.9 09/30/2013   PLT 225 09/30/2013   Lab Results  Component Value Date   CREATININE 0.79 09/30/2013   BUN 22 09/30/2013   NA 140 09/30/2013   K 3.9 09/30/2013   CL 103 09/30/2013   CO2 31 09/30/2013   Lab Results  Component Value Date   ALT 10 09/30/2013   AST 17 09/30/2013   ALKPHOS 66 09/30/2013   BILITOT 0.4 09/30/2013   Lab Results  Component Value Date   CHOL 184 09/30/2013   Lab Results  Component Value Date   HDL 73 09/30/2013   Lab Results  Component Value Date   LDLCALC 92 09/30/2013   Lab Results  Component Value Date   TRIG 94 09/30/2013   Lab Results  Component Value Date   CHOLHDL 2.5 09/30/2013     Assessment & Plan  HTN (hypertension) Well controlled, no changes to meds. Encouraged heart healthy diet such as the DASH diet and exercise as tolerated.   GERD Avoid offending foods, start probiotics. Do not eat large meals in late evening and consider raising head of bed.   Neck pain, chronic Pain is intermittent xray has some lucent lesions at occiput. SPEP norma, consider CT scan if patient agrees.   HYPERLIPIDEMIA Encouraged heart healthy diet, increase exercise, avoid trans fats, consider a krill oil cap daily  SVT (supraventricular tachycardia) RRR today  Grief reaction Tomorrow is the 5 year  anniversary of her daughter's death from cancer. Very sad today vut overall doing well  Medicare annual wellness visit, subsequent Patient denies any difficulties at home. No trouble with ADLs, depression or falls. No recent changes to vision or hearing. Is UTD with immunizations. Is UTD with screening. Discussed Advanced Directives, patient agrees to bring [place] copies of documents if can. Encouraged heart healthy diet, exercise as tolerated and adequate sleep. MGM is up to date and is scheduled for next one at [facility] next month. Colonoscopy is UTD but will proceed with IFOB. Has hearing aides and finds them helpful  OSTEOPOROSIS Continue calcium and vitamin d supplements, increase exercise

## 2014-05-10 ENCOUNTER — Telehealth: Payer: Self-pay

## 2014-05-10 ENCOUNTER — Telehealth: Payer: Self-pay | Admitting: Family Medicine

## 2014-05-10 DIAGNOSIS — M899 Disorder of bone, unspecified: Secondary | ICD-10-CM

## 2014-05-10 DIAGNOSIS — M949 Disorder of cartilage, unspecified: Principal | ICD-10-CM

## 2014-05-10 LAB — VITAMIN D 25 HYDROXY (VIT D DEFICIENCY, FRACTURES): Vit D, 25-Hydroxy: 53 ng/mL (ref 30–89)

## 2014-05-10 LAB — IRON AND TIBC
%SAT: 39 % (ref 20–55)
Iron: 138 ug/dL (ref 42–145)
TIBC: 357 ug/dL (ref 250–470)
UIBC: 219 ug/dL (ref 125–400)

## 2014-05-10 LAB — RENAL FUNCTION PANEL
Albumin: 4.4 g/dL (ref 3.5–5.2)
BUN: 23 mg/dL (ref 6–23)
CALCIUM: 10 mg/dL (ref 8.4–10.5)
CO2: 30 mEq/L (ref 19–32)
Chloride: 100 mEq/L (ref 96–112)
Creat: 0.83 mg/dL (ref 0.50–1.10)
Glucose, Bld: 94 mg/dL (ref 70–99)
POTASSIUM: 4.5 meq/L (ref 3.5–5.3)
Phosphorus: 3.1 mg/dL (ref 2.3–4.6)
Sodium: 139 mEq/L (ref 135–145)

## 2014-05-10 LAB — LIPID PANEL
Cholesterol: 266 mg/dL — ABNORMAL HIGH (ref 0–200)
HDL: 72 mg/dL (ref >39)
LDL Cholesterol: 167 mg/dL — ABNORMAL HIGH (ref 0–99)
Total CHOL/HDL Ratio: 3.7 Ratio
Triglycerides: 134 mg/dL (ref <150)
VLDL: 27 mg/dL (ref 0–40)

## 2014-05-10 LAB — HEPATIC FUNCTION PANEL
ALBUMIN: 4.4 g/dL (ref 3.5–5.2)
ALT: 11 U/L (ref 0–35)
AST: 17 U/L (ref 0–37)
Alkaline Phosphatase: 67 U/L (ref 39–117)
Bilirubin, Direct: 0.1 mg/dL (ref 0.0–0.3)
Indirect Bilirubin: 0.5 mg/dL (ref 0.2–1.2)
TOTAL PROTEIN: 7.4 g/dL (ref 6.0–8.3)
Total Bilirubin: 0.6 mg/dL (ref 0.2–1.2)

## 2014-05-10 LAB — TSH: TSH: 2.367 u[IU]/mL (ref 0.350–4.500)

## 2014-05-10 MED ORDER — SIMVASTATIN 10 MG PO TABS: 10.0000 mg | ORAL_TABLET | Freq: Every day | ORAL | Status: DC

## 2014-05-10 NOTE — Telephone Encounter (Signed)
Lab order for SPEP reorded due to it being future

## 2014-05-10 NOTE — Telephone Encounter (Signed)
Relevant patient education assigned to patient using Emmi. ° °

## 2014-05-12 ENCOUNTER — Encounter: Payer: Self-pay | Admitting: Family Medicine

## 2014-05-12 LAB — PROTEIN ELECTROPHORESIS, SERUM
ALBUMIN ELP: 58.4 % (ref 55.8–66.1)
Alpha-1-Globulin: 3.9 % (ref 2.9–4.9)
Alpha-2-Globulin: 9 % (ref 7.1–11.8)
BETA GLOBULIN: 7 % (ref 4.7–7.2)
Beta 2: 6 % (ref 3.2–6.5)
Gamma Globulin: 15.7 % (ref 11.1–18.8)
TOTAL PROTEIN, SERUM ELECTROPHOR: 7.7 g/dL (ref 6.0–8.3)

## 2014-05-15 NOTE — Assessment & Plan Note (Signed)
Encouraged heart healthy diet, increase exercise, avoid trans fats, consider a krill oil cap daily 

## 2014-05-15 NOTE — Assessment & Plan Note (Signed)
RRR today 

## 2014-05-15 NOTE — Assessment & Plan Note (Signed)
Patient denies any difficulties at home. No trouble with ADLs, depression or falls. No recent changes to vision or hearing. Is UTD with immunizations. Is UTD with screening. Discussed Advanced Directives, patient agrees to bring [place] copies of documents if can. Encouraged heart healthy diet, exercise as tolerated and adequate sleep. MGM is up to date and is scheduled for next one at [facility] next month. Colonoscopy is UTD but will proceed with IFOB. Has hearing aides and finds them helpful

## 2014-05-15 NOTE — Assessment & Plan Note (Signed)
Continue calcium and vitamin d supplements, increase exercise

## 2014-05-15 NOTE — Assessment & Plan Note (Signed)
Tomorrow is the 5 year anniversary of her daughter's death from cancer. Very sad today vut overall doing well

## 2014-05-17 NOTE — Telephone Encounter (Signed)
Spoke with Dr Jasmine December of radiology and he suggests we perform a skull series of her head to assess for further lesions before doing any further work up. If patient is agreeable I will order. Please check with her

## 2014-05-17 NOTE — Telephone Encounter (Signed)
Message copied by Mosie Lukes on Tue May 17, 2014  3:43 PM ------      Message from: Penni Homans A      Created: Sat May 14, 2014 11:02 AM      Regarding: [place], [place]       Check with Radiology about possible future imaging ------

## 2014-06-02 ENCOUNTER — Telehealth: Payer: Self-pay | Admitting: Family Medicine

## 2014-06-02 ENCOUNTER — Other Ambulatory Visit: Payer: Self-pay | Admitting: Family Medicine

## 2014-06-02 DIAGNOSIS — N949 Unspecified condition associated with female genital organs and menstrual cycle: Secondary | ICD-10-CM

## 2014-06-02 NOTE — Telephone Encounter (Signed)
The protrusion in her vagina is getting bigger  She would like to get a referral to a female gyn in [place].  She will be out of town 7-31 to 8-9  Appointment will need to be before or after these dates

## 2014-06-21 ENCOUNTER — Other Ambulatory Visit: Payer: Medicare PPO

## 2014-06-21 ENCOUNTER — Other Ambulatory Visit (INDEPENDENT_AMBULATORY_CARE_PROVIDER_SITE_OTHER): Payer: Medicare PPO

## 2014-06-21 ENCOUNTER — Telehealth: Payer: Self-pay

## 2014-06-21 DIAGNOSIS — D539 Nutritional anemia, unspecified: Secondary | ICD-10-CM

## 2014-06-21 LAB — FECAL OCCULT BLOOD, IMMUNOCHEMICAL: Fecal Occult Bld: NEGATIVE

## 2014-06-21 NOTE — Telephone Encounter (Signed)
Message copied by Varney Daily on Tue Jun 21, 2014  2:07 PM ------      Message from: Irven Baltimore      Created: Tue Jun 21, 2014  8:14 AM      Regarding: orders       Willette Cluster from [facility] needs an IFOB order put in for this patient ------

## 2014-06-24 LAB — HM MAMMOGRAPHY: HM MAMMO: NORMAL

## 2014-06-27 ENCOUNTER — Encounter: Payer: Self-pay | Admitting: Family Medicine

## 2014-07-20 ENCOUNTER — Encounter: Payer: Self-pay | Admitting: Physician Assistant

## 2014-07-20 ENCOUNTER — Ambulatory Visit (INDEPENDENT_AMBULATORY_CARE_PROVIDER_SITE_OTHER): Payer: Medicare PPO | Admitting: Physician Assistant

## 2014-07-20 VITALS — BP 157/80 | HR 76 | Temp 98.7°F | Resp 14 | Ht 63.0 in | Wt 124.4 lb

## 2014-07-20 DIAGNOSIS — S8012XA Contusion of left lower leg, initial encounter: Secondary | ICD-10-CM

## 2014-07-20 DIAGNOSIS — S8010XA Contusion of unspecified lower leg, initial encounter: Secondary | ICD-10-CM | POA: Insufficient documentation

## 2014-07-20 DIAGNOSIS — Z23 Encounter for immunization: Secondary | ICD-10-CM

## 2014-07-20 NOTE — Progress Notes (Signed)
Pre visit review using our clinic review tool, if applicable. No additional management support is needed unless otherwise documented below in the visit note/SLS  

## 2014-07-20 NOTE — Progress Notes (Signed)
Patient presents to clinic today c/o pain and swelling of left shin after striking it on the edge of her tub 2 weeks ago.  Endorses pain at site of swelling.  Denies pain with weightbearing or ambulation. States swelling has improved but is still present.  Denies ecchymoses, erythema.  Endorses some warmth at area. Has taken Ibuprofen and applied ice to her leg daily.   Past Medical History  Diagnosis Date  . SVT (supraventricular tachycardia) 2007    3-4 beats.. Holter  . Sinus tachycardia     mild-- retesting  . Fatigue 1/11    w/ exercise.. responded to increased beta blocker dose  . Dyslipidemia   . Anemia     iron deficiency  . GERD (gastroesophageal reflux disease)   . Osteoporosis   . Other specified cardiac dysrhythmias(427.89) 09/17/2010  . OSTEOPOROSIS 08/13/2006  . MEASLES, HX OF 01/28/2011  . HYPERLIPIDEMIA 12/17/2007  . GERD 12/28/2007  . HTN (hypertension) 03/06/2011  . DIVERTICULOSIS, COLON 01/28/2011  . CONSTIPATION 01/28/2011  . CHICKENPOX, HX OF 01/28/2011  . ANEMIA, CHRONIC 08/13/2006  . Post menopausal problems 03/06/2011  . Lesion of external ear 03/06/2011  . Sinusitis 12/16/2011  . Hypercholesteremia 03/10/2012  . Grief reaction 03/10/2012  . Ejection fraction     EF 55%, echo, 2007  . Normal cardiac stress test     Nuclear, 2004, normal  . Arthritis     left knee  . Atrophic vaginitis 10/03/2013  . Sinusitis, acute 11/02/2013  . Medicare annual wellness visit, subsequent 05/09/2014    Current Outpatient Prescriptions on File Prior to Visit  Medication Sig Dispense Refill  . ALPRAZolam (XANAX) 0.5 MG tablet Take 1 tablet (0.5 mg total) by mouth as needed for sleep or anxiety. 1/2 to 2 tabs po bid prn for anxiety, insomnia  90 tablet  0  . aspirin 81 MG tablet Take 81 mg by mouth daily.        Marland Kitchen BIOTIN PO Take 1 tablet by mouth daily.      . Calcium Carbonate-Vitamin D (CALTRATE 600+D) 600-400 MG-UNIT per tablet Take 1 tablet by mouth 2 (two) times daily.        .  Cholecalciferol (VITAMIN D) 1000 UNITS capsule Take 1,000 Units by mouth daily.        Marland Kitchen diltiazem (CARDIZEM) 120 MG tablet Take 1 tablet (120 mg total) by mouth daily.  90 tablet  3  . diphenhydramine-acetaminophen (TYLENOL PM) 25-500 MG TABS Take 1 tablet by mouth at bedtime as needed.      . fish oil-omega-3 fatty acids 1000 MG capsule Take 2 g by mouth 3 (three) times daily.        . Lutein 6 MG CAPS Take 1 capsule by mouth daily.        . metoprolol succinate (TOPROL-XL) 50 MG 24 hr tablet Take 1 tablet (50 mg total) by mouth daily.  90 tablet  2  . polyethylene glycol powder (MIRALAX) powder Take 17 g by mouth daily as needed.  255 g  0  . Probiotic Product (PEARLS IC PO) Take by mouth.      . simvastatin (ZOCOR) 10 MG tablet Take 1 tablet (10 mg total) by mouth daily.  30 tablet  3   No current facility-administered medications on file prior to visit.    No Known Allergies  Family History  Problem Relation Age of Onset  . Stroke Mother   . Hypertension Mother   . Other Mother  CHF  . Transient ischemic attack Mother   . Cancer Mother 80    cervical  . Cancer Father     stomach, smoker  . Cancer Sister     brain  . Lymphoma Brother   . Stroke Daughter     ? stroke  . Mitral valve prolapse Daughter   . Irritable bowel syndrome Daughter   . Migraines Daughter   . Hypertension Son   . Cancer Paternal Aunt     cancer  . Cancer Paternal Uncle     X 2 CA  . Heart attack Paternal Uncle   . Other Maternal Grandmother     CHF  . Alcohol abuse Brother   . Heart attack Brother     smoker    History   Social History  . Marital Status: Married    Spouse Name: N/A    Number of Children: N/A  . Years of Education: N/A   Social History Main Topics  . Smoking status: Never Smoker   . Smokeless tobacco: Never Used  . Alcohol Use: No  . Drug Use: No  . Sexual Activity: No   Other Topics Concern  . None   Social History Narrative  . None   Review of Systems -  See HPI.  All other ROS are negative.  BP 157/80  Pulse 76  Temp(Src) 98.7 F (37.1 C) (Oral)  Resp 14  Ht 5\' 3"  (1.6 m)  Wt 124 lb 6 oz (56.416 kg)  BMI 22.04 kg/m2  SpO2 97%  Physical Exam  Constitutional: She is oriented to person, place, and time and well-developed, well-nourished, and in no distress.  HENT:  Head: Normocephalic and atraumatic.  Cardiovascular: Normal rate and regular rhythm.   Pulmonary/Chest: Effort normal and breath sounds normal.  Musculoskeletal:       Right knee: Normal.       Left knee: Normal.       Right ankle: Normal.       Left ankle: Normal.       Right lower leg: Normal.       Legs: Neurological: She is alert and oriented to person, place, and time.  Skin: Skin is warm and dry.    Recent Results (from the past 2160 hour(s))  CBC     Status: None   Collection Time    05/09/14 11:50 AM      Result Value Ref Range   WBC 7.3  4.0 - 10.5 K/uL   RBC 4.27  3.87 - 5.11 MIL/uL   Hemoglobin 12.2  12.0 - 15.0 g/dL   HCT 36.2  36.0 - 46.0 %   MCV 84.8  78.0 - 100.0 fL   MCH 28.6  26.0 - 34.0 pg   MCHC 33.7  30.0 - 36.0 g/dL   RDW 12.9  11.5 - 15.5 %   Platelets 233  150 - 400 K/uL  RENAL FUNCTION PANEL     Status: None   Collection Time    05/09/14 11:50 AM      Result Value Ref Range   Sodium 139  135 - 145 mEq/L   Potassium 4.5  3.5 - 5.3 mEq/L   Chloride 100  96 - 112 mEq/L   CO2 30  19 - 32 mEq/L   Glucose, Bld 94  70 - 99 mg/dL   BUN 23  6 - 23 mg/dL   Creat 0.83  0.50 - 1.10 mg/dL   Albumin 4.4  3.5 - 5.2 g/dL  Calcium 10.0  8.4 - 10.5 mg/dL   Phosphorus 3.1  2.3 - 4.6 mg/dL  HEPATIC FUNCTION PANEL     Status: None   Collection Time    05/09/14 11:50 AM      Result Value Ref Range   Total Bilirubin 0.6  0.2 - 1.2 mg/dL   Bilirubin, Direct 0.1  0.0 - 0.3 mg/dL   Indirect Bilirubin 0.5  0.2 - 1.2 mg/dL   Alkaline Phosphatase 67  39 - 117 U/L   AST 17  0 - 37 U/L   ALT 11  0 - 35 U/L   Total Protein 7.4  6.0 - 8.3 g/dL    Albumin 4.4  3.5 - 5.2 g/dL  LIPID PANEL     Status: Abnormal   Collection Time    05/09/14 11:50 AM      Result Value Ref Range   Cholesterol 266 (*) 0 - 200 mg/dL   Comment: ATP III Classification:           < 200        mg/dL        Desirable          200 - 239     mg/dL        Borderline High          >= 240        mg/dL        High         Triglycerides 134  <150 mg/dL   HDL 72  >39 mg/dL   Total CHOL/HDL Ratio 3.7     VLDL 27  0 - 40 mg/dL   LDL Cholesterol 167 (*) 0 - 99 mg/dL   Comment:       Total Cholesterol/HDL Ratio:CHD Risk                            Coronary Heart Disease Risk Table                                            Men       Women              1/2 Average Risk              3.4        3.3                  Average Risk              5.0        4.4               2X Average Risk              9.6        7.1               3X Average Risk             23.4       11.0     Use the calculated Patient Ratio above and the CHD Risk table      to determine the patient's CHD Risk.     ATP III Classification (LDL):           < 100        mg/dL  Optimal          100 - 129     mg/dL         Near or Above Optimal          130 - 159     mg/dL         Borderline High          160 - 189     mg/dL         High           > 190        mg/dL         Very High        TSH     Status: None   Collection Time    05/09/14 11:50 AM      Result Value Ref Range   TSH 2.367  0.350 - 4.500 uIU/mL  VITAMIN D 25 HYDROXY     Status: None   Collection Time    05/09/14 11:50 AM      Result Value Ref Range   Vit D, 25-Hydroxy 53  30 - 89 ng/mL   Comment: This assay accurately quantifies Vitamin D, which is the sum of the     25-Hydroxy forms of Vitamin D2 and D3.  Studies have shown that the     optimum concentration of 25-Hydroxy Vitamin D is 30 ng/mL or higher.      Concentrations of Vitamin D between 20 and 29 ng/mL are considered to     be insufficient and concentrations less  than 20 ng/mL are considered     to be deficient for Vitamin D.  IRON AND TIBC     Status: None   Collection Time    05/09/14 11:50 AM      Result Value Ref Range   Iron 138  42 - 145 ug/dL   UIBC 219  125 - 400 ug/dL   TIBC 357  250 - 470 ug/dL   %SAT 39  20 - 55 %  PROTEIN ELECTROPHORESIS, SERUM     Status: None   Collection Time    05/10/14  2:00 PM      Result Value Ref Range   Total Protein, Serum Electrophoresis 7.7  6.0 - 8.3 g/dL   Albumin ELP 58.4  55.8 - 66.1 %   Alpha-1-Globulin 3.9  2.9 - 4.9 %   Alpha-2-Globulin 9.0  7.1 - 11.8 %   Beta Globulin 7.0  4.7 - 7.2 %   Beta 2 6.0  3.2 - 6.5 %   Gamma Globulin 15.7  11.1 - 18.8 %   M-Spike, % NOT DET     SPE Interp. SEE NOTE     Comment: Normal pattern.     Reviewed by Odis Hollingshead, MD, PhD, FCAP (Electronic Signature on     File)   COMMENT (PROTEIN ELECTROPHOR) SEE NOTE     Comment: ---------------     Serum protein electrophoresis is a useful screening procedure in the     detection of various pathophysiologic states such as inflammation,     gammopathies, protein loss and other dysproteinemias.  Immunofixation     electrophoresis (IFE) is a more sensitive technique for the     identification of M-proteins found in patients with monoclonal     gammopathy of unknown significance (MGUS), amyloidosis, early or     treated myeloma or macroglobulinemia, solitary plasmacytoma or     extramedullary plasmacytoma.  FECAL OCCULT BLOOD,  IMMUNOCHEMICAL     Status: None   Collection Time    06/21/14  2:56 PM      Result Value Ref Range   Fecal Occult Bld Negative  Negative  HM MAMMOGRAPHY     Status: None   Collection Time    06/24/14 12:00 AM      Result Value Ref Range   HM Mammogram normal per [facility]      Assessment/Plan: Hematoma of leg RICE therapy.  Tylenol or ibuprofen if needed for pain.  Discussed prognosis and possible new symptoms of this condition. Patient voices understanding.  Handout  given.

## 2014-07-20 NOTE — Assessment & Plan Note (Signed)
RICE therapy.  Tylenol or ibuprofen if needed for pain.  Discussed prognosis and possible new symptoms of this condition. Patient voices understanding.  Handout given.

## 2014-07-20 NOTE — Patient Instructions (Signed)
The "goose egg" on your leg is a benign hematoma, or a collection of blood due to your leg injury.  This is not worrisome but will take several weeks to a couple of months to heal.  Keep the area iced and apply some topical Aspercreme.  Keep leg elevated while resting.  Hematoma A hematoma is a collection of blood. The collection of blood can turn into a hard, painful lump under the skin. Your skin may turn blue or yellow if the hematoma is close to the surface of the skin. Most hematomas get better in a few days to weeks. Some hematomas are serious and need medical care. Hematomas can be very small or very big. HOME CARE  Apply ice to the injured area:  Put ice in a plastic bag.  Place a towel between your skin and the bag.  Leave the ice on for 20 minutes, 2-3 times a day for the first 1 to 2 days.  After the first 2 days, switch to using warm packs on the injured area.  Raise (elevate) the injured area to lessen pain and puffiness (swelling). You may also wrap the area with an elastic bandage. Make sure the bandage is not wrapped too tight.  If you have a painful hematoma on your leg or foot, you may use crutches for a couple days.  Only take medicines as told by your doctor. GET HELP RIGHT AWAY IF:   Your pain gets worse.  Your pain is not controlled with medicine.  You have a fever.  Your puffiness gets worse.  Your skin turns more blue or yellow.  Your skin over the hematoma breaks or starts bleeding.  Your hematoma is in your chest or belly (abdomen) and you are short of breath, feel weak, or have a change in consciousness.  Your hematoma is on your scalp and you have a headache that gets worse or a change in alertness or consciousness. MAKE SURE YOU:   Understand these instructions.  Will watch your condition.  Will get help right away if you are not doing well or get worse. Document Released: 12/05/2004 Document Revised: 06/30/2013 Document Reviewed:  04/07/2013 [facility] Patient Information 2015 [place], [place]. This information is not intended to replace advice given to you by your health care provider. Make sure you discuss any questions you have with your health care provider.

## 2014-08-16 ENCOUNTER — Other Ambulatory Visit: Payer: Self-pay | Admitting: *Deleted

## 2014-08-16 MED ORDER — METOPROLOL SUCCINATE ER 50 MG PO TB24: 50.0000 mg | ORAL_TABLET | Freq: Every day | ORAL | Status: DC

## 2014-08-30 ENCOUNTER — Other Ambulatory Visit: Payer: Self-pay | Admitting: Cardiology

## 2014-09-02 ENCOUNTER — Other Ambulatory Visit: Payer: Self-pay | Admitting: Family Medicine

## 2014-09-06 ENCOUNTER — Ambulatory Visit (INDEPENDENT_AMBULATORY_CARE_PROVIDER_SITE_OTHER): Payer: Commercial Managed Care - HMO | Admitting: Family Medicine

## 2014-09-06 ENCOUNTER — Encounter: Payer: Self-pay | Admitting: Family Medicine

## 2014-09-06 VITALS — BP 153/65 | HR 88 | Temp 97.9°F | Ht 63.0 in | Wt 124.0 lb

## 2014-09-06 DIAGNOSIS — I10 Essential (primary) hypertension: Secondary | ICD-10-CM

## 2014-09-06 DIAGNOSIS — I471 Supraventricular tachycardia: Secondary | ICD-10-CM

## 2014-09-06 DIAGNOSIS — K219 Gastro-esophageal reflux disease without esophagitis: Secondary | ICD-10-CM

## 2014-09-06 DIAGNOSIS — R000 Tachycardia, unspecified: Secondary | ICD-10-CM

## 2014-09-06 DIAGNOSIS — E785 Hyperlipidemia, unspecified: Secondary | ICD-10-CM

## 2014-09-06 DIAGNOSIS — M810 Age-related osteoporosis without current pathological fracture: Secondary | ICD-10-CM

## 2014-09-06 LAB — LIPID PANEL
CHOLESTEROL: 191 mg/dL (ref 0–200)
HDL: 59.9 mg/dL (ref >39.00)
LDL Cholesterol: 102 mg/dL — ABNORMAL HIGH (ref 0–99)
NonHDL: 131.1
TRIGLYCERIDES: 144 mg/dL (ref 0.0–149.0)
Total CHOL/HDL Ratio: 3
VLDL: 28.8 mg/dL (ref 0.0–40.0)

## 2014-09-06 LAB — CBC
HEMATOCRIT: 36.8 % (ref 36.0–46.0)
Hemoglobin: 12 g/dL (ref 12.0–15.0)
MCHC: 32.6 g/dL (ref 30.0–36.0)
MCV: 87.6 fl (ref 78.0–100.0)
Platelets: 181 10*3/uL (ref 150.0–400.0)
RBC: 4.21 Mil/uL (ref 3.87–5.11)
RDW: 12.9 % (ref 11.5–15.5)
WBC: 6.6 10*3/uL (ref 4.0–10.5)

## 2014-09-06 LAB — HEPATIC FUNCTION PANEL
ALT: 14 U/L (ref 0–35)
AST: 21 U/L (ref 0–37)
Albumin: 3.8 g/dL (ref 3.5–5.2)
Alkaline Phosphatase: 75 U/L (ref 39–117)
BILIRUBIN DIRECT: 0 mg/dL (ref 0.0–0.3)
TOTAL PROTEIN: 8 g/dL (ref 6.0–8.3)
Total Bilirubin: 0.6 mg/dL (ref 0.2–1.2)

## 2014-09-06 LAB — RENAL FUNCTION PANEL
ALBUMIN: 3.8 g/dL (ref 3.5–5.2)
BUN: 23 mg/dL (ref 6–23)
CO2: 23 meq/L (ref 19–32)
Calcium: 10.4 mg/dL (ref 8.4–10.5)
Chloride: 102 mEq/L (ref 96–112)
Creatinine, Ser: 1 mg/dL (ref 0.4–1.2)
GFR: 60.32 mL/min (ref >60.00)
Glucose, Bld: 84 mg/dL (ref 70–99)
Phosphorus: 3.3 mg/dL (ref 2.3–4.6)
Potassium: 3.6 mEq/L (ref 3.5–5.1)
Sodium: 138 mEq/L (ref 135–145)

## 2014-09-06 LAB — TSH: TSH: 1.81 u[IU]/mL (ref 0.35–4.50)

## 2014-09-06 LAB — VITAMIN D 25 HYDROXY (VIT D DEFICIENCY, FRACTURES): VITD: 47.34 ng/mL (ref 30.00–100.00)

## 2014-09-06 MED ORDER — SIMVASTATIN 10 MG PO TABS: 10.0000 mg | ORAL_TABLET | Freq: Every day | ORAL | Status: DC

## 2014-09-06 MED ORDER — ALPRAZOLAM 0.5 MG PO TABS: 0.5000 mg | ORAL_TABLET | ORAL | Status: DC | PRN

## 2014-09-06 NOTE — Patient Instructions (Addendum)
64 oz of clear fluids in 24 hours and eat lean protein every 4-5 hours.  Melatonin or L tryptophan over the counter 1/2 hour prior to bedtime   Insomnia Insomnia is frequent trouble falling and/or staying asleep. Insomnia can be a long term problem or a short term problem. Both are common. Insomnia can be a short term problem when the wakefulness is related to a certain stress or worry. Long term insomnia is often related to ongoing stress during waking hours and/or poor sleeping habits. Overtime, sleep deprivation itself can make the problem worse. Every little thing feels more severe because you are overtired and your ability to cope is decreased. CAUSES   Stress, anxiety, and depression.  Poor sleeping habits.  Distractions such as TV in the bedroom.  Naps close to bedtime.  Engaging in emotionally charged conversations before bed.  Technical reading before sleep.  Alcohol and other sedatives. They may make the problem worse. They can hurt normal sleep patterns and normal dream activity.  Stimulants such as caffeine for several hours prior to bedtime.  Pain syndromes and shortness of breath can cause insomnia.  Exercise late at night.  Changing time zones may cause sleeping problems (jet lag). It is sometimes helpful to have someone observe your sleeping patterns. They should look for periods of not breathing during the night (sleep apnea). They should also look to see how long those periods last. If you live alone or observers are uncertain, you can also be observed at a sleep clinic where your sleep patterns will be professionally monitored. Sleep apnea requires a checkup and treatment. Give your caregivers your medical history. Give your caregivers observations your family has made about your sleep.  SYMPTOMS   Not feeling rested in the morning.  Anxiety and restlessness at bedtime.  Difficulty falling and staying asleep. TREATMENT   Your caregiver may prescribe  treatment for an underlying medical disorders. Your caregiver can give advice or help if you are using alcohol or other drugs for self-medication. Treatment of underlying problems will usually eliminate insomnia problems.  Medications can be prescribed for short time use. They are generally not recommended for lengthy use.  Over-the-counter sleep medicines are not recommended for lengthy use. They can be habit forming.  You can promote easier sleeping by making lifestyle changes such as:  Using relaxation techniques that help with breathing and reduce muscle tension.  Exercising earlier in the day.  Changing your diet and the time of your last meal. No night time snacks.  Establish a regular time to go to bed.  Counseling can help with stressful problems and worry.  Soothing music and white noise may be helpful if there are background noises you cannot remove.  Stop tedious detailed work at least one hour before bedtime. HOME CARE INSTRUCTIONS   Keep a diary. Inform your caregiver about your progress. This includes any medication side effects. See your caregiver regularly. Take note of:  Times when you are asleep.  Times when you are awake during the night.  The quality of your sleep.  How you feel the next day. This information will help your caregiver care for you.  Get out of bed if you are still awake after 15 minutes. Read or do some quiet activity. Keep the lights down. Wait until you feel sleepy and go back to bed.  Keep regular sleeping and waking hours. Avoid naps.  Exercise regularly.  Avoid distractions at bedtime. Distractions include watching television or engaging in any intense  or detailed activity like attempting to balance the household checkbook.  Develop a bedtime ritual. Keep a familiar routine of bathing, brushing your teeth, climbing into bed at the same time each night, listening to soothing music. Routines increase the success of falling to sleep  faster.  Use relaxation techniques. This can be using breathing and muscle tension release routines. It can also include visualizing peaceful scenes. You can also help control troubling or intruding thoughts by keeping your mind occupied with boring or repetitive thoughts like the old concept of counting sheep. You can make it more creative like imagining planting one beautiful flower after another in your backyard garden.  During your day, work to eliminate stress. When this is not possible use some of the previous suggestions to help reduce the anxiety that accompanies stressful situations. MAKE SURE YOU:   Understand these instructions.  Will watch your condition.  Will get help right away if you are not doing well or get worse. Document Released: 10/25/2000 Document Revised: 01/20/2012 Document Reviewed: 11/25/2007 [facility] Patient Information 2015 [place], [place]. This information is not intended to replace advice given to you by your health care provider. Make sure you discuss any questions you have with your health care provider.

## 2014-09-06 NOTE — Progress Notes (Signed)
Pre visit review using our clinic review tool, if applicable. No additional management support is needed unless otherwise documented below in the visit note. 

## 2014-09-11 NOTE — Progress Notes (Signed)
Karina [name] [id number] [birth year] 09/11/2014      Progress Note-Follow Up  Subjective  Chief Complaint  Chief Complaint  Patient presents with  . Follow-up    4 month    HPI  Patient is a 80 year old female in today for routine medical care. In today for follow up no recent illness. Has been eating well and exercising regularly. Denies CP/palp/SOB/HA/congestion/fevers/GI or GU c/o. Taking meds as prescribed  Past Medical History  Diagnosis Date  . SVT (supraventricular tachycardia) 2007    3-4 beats.. Holter  . Sinus tachycardia     mild-- retesting  . Fatigue 1/11    w/ exercise.. responded to increased beta blocker dose  . Dyslipidemia   . Anemia     iron deficiency  . GERD (gastroesophageal reflux disease)   . Osteoporosis   . Other specified cardiac dysrhythmias(427.89) 09/17/2010  . OSTEOPOROSIS 08/13/2006  . MEASLES, HX OF 01/28/2011  . HYPERLIPIDEMIA 12/17/2007  . GERD 12/28/2007  . HTN (hypertension) 03/06/2011  . DIVERTICULOSIS, COLON 01/28/2011  . CONSTIPATION 01/28/2011  . CHICKENPOX, HX OF 01/28/2011  . ANEMIA, CHRONIC 08/13/2006  . Post menopausal problems 03/06/2011  . Lesion of external ear 03/06/2011  . Sinusitis 12/16/2011  . Hypercholesteremia 03/10/2012  . Grief reaction 03/10/2012  . Ejection fraction     EF 55%, echo, 2007  . Normal cardiac stress test     Nuclear, 2004, normal  . Arthritis     left knee  . Atrophic vaginitis 10/03/2013  . Sinusitis, acute 11/02/2013  . Medicare annual wellness visit, subsequent 05/09/2014    Past Surgical History  Procedure Laterality Date  . Abdominal hysterectomy      w/ unilateral oophorectomy-bleeding, ovarian cysts, left ovary left in place?  . Breast surgery      Implants  . Right knee arthroscopy, cleaned      Family History  Problem Relation Age of Onset  . Stroke Mother   . Hypertension Mother   . Other Mother     CHF  . Transient ischemic attack Mother   . Cancer Mother 38    cervical  .  Cancer Father     stomach, smoker  . Cancer Sister     brain  . Lymphoma Brother   . Stroke Daughter     ? stroke  . Mitral valve prolapse Daughter   . Irritable bowel syndrome Daughter   . Migraines Daughter   . Hypertension Son   . Cancer Paternal Aunt     cancer  . Cancer Paternal Uncle     X 2 CA  . Heart attack Paternal Uncle   . Other Maternal Grandmother     CHF  . Alcohol abuse Brother   . Heart attack Brother     smoker    History   Social History  . Marital Status: Married    Spouse Name: N/A    Number of Children: N/A  . Years of Education: N/A   Occupational History  . Not on file.   Social History Main Topics  . Smoking status: Never Smoker   . Smokeless tobacco: Never Used  . Alcohol Use: No  . Drug Use: No  . Sexual Activity: No   Other Topics Concern  . Not on file   Social History Narrative  . No narrative on file    Current Outpatient Prescriptions on File Prior to Visit  Medication Sig Dispense Refill  . aspirin 81 MG tablet Take 81 mg  by mouth daily.      Marland Kitchen BIOTIN PO Take 1 tablet by mouth daily.    . Calcium Carbonate-Vitamin D (CALTRATE 600+D) 600-400 MG-UNIT per tablet Take 1 tablet by mouth 2 (two) times daily.      . Cholecalciferol (VITAMIN D) 1000 UNITS capsule Take 1,000 Units by mouth daily.      Marland Kitchen co-enzyme Q-10 30 MG capsule Take 30 mg by mouth 2 (two) times daily.    Marland Kitchen diltiazem (CARDIZEM) 120 MG tablet TAKE 1 TABLET EVERY DAY 90 tablet 3  . diphenhydramine-acetaminophen (TYLENOL PM) 25-500 MG TABS Take 1 tablet by mouth at bedtime as needed.    . fish oil-omega-3 fatty acids 1000 MG capsule Take 2 g by mouth 3 (three) times daily.      . Lutein 6 MG CAPS Take 1 capsule by mouth daily.      . metoprolol succinate (TOPROL-XL) 50 MG 24 hr tablet Take 1 tablet (50 mg total) by mouth daily. 90 tablet 1  . polyethylene glycol powder (MIRALAX) powder Take 17 g by mouth daily as needed. 255 g 0  . Probiotic Product (PEARLS IC PO)  Take by mouth.     No current facility-administered medications on file prior to visit.    No Known Allergies  Review of Systems  Review of Systems  Constitutional: Negative for fever and malaise/fatigue.  HENT: Negative for congestion.   Eyes: Negative for discharge.  Respiratory: Negative for shortness of breath.   Cardiovascular: Negative for chest pain, palpitations and leg swelling.  Gastrointestinal: Negative for nausea, abdominal pain and diarrhea.  Genitourinary: Negative for dysuria.  Musculoskeletal: Negative for falls.  Skin: Negative for rash.  Neurological: Negative for loss of consciousness and headaches.  Endo/Heme/Allergies: Negative for polydipsia.  Psychiatric/Behavioral: Negative for depression and suicidal ideas. The patient is not nervous/anxious and does not have insomnia.     Objective  BP 153/65 mmHg  Pulse 88  Temp(Src) 97.9 F (36.6 C) (Oral)  Ht 5\' 3"  (1.6 m)  Wt 124 lb (56.246 kg)  BMI 21.97 kg/m2  SpO2 97%  Physical Exam  Physical Exam  Constitutional: She is oriented to person, place, and time and well-developed, well-nourished, and in no distress. No distress.  HENT:  Head: Normocephalic and atraumatic.  Eyes: Conjunctivae are normal.  Neck: Neck supple. No thyromegaly present.  Cardiovascular: Normal rate, regular rhythm and normal heart sounds.   No murmur heard. Pulmonary/Chest: Effort normal and breath sounds normal. She has no wheezes.  Abdominal: She exhibits no distension and no mass.  Musculoskeletal: She exhibits no edema.  Lymphadenopathy:    She has no cervical adenopathy.  Neurological: She is alert and oriented to person, place, and time.  Skin: Skin is warm and dry. No rash noted. She is not diaphoretic.  Psychiatric: Memory, affect and judgment normal.    Lab Results  Component Value Date   TSH 1.81 09/06/2014   Lab Results  Component Value Date   WBC 6.6 09/06/2014   HGB 12.0 09/06/2014   HCT 36.8 09/06/2014    MCV 87.6 09/06/2014   PLT 181.0 09/06/2014   Lab Results  Component Value Date   CREATININE 1.0 09/06/2014   BUN 23 09/06/2014   NA 138 09/06/2014   K 3.6 09/06/2014   CL 102 09/06/2014   CO2 23 09/06/2014   Lab Results  Component Value Date   ALT 14 09/06/2014   AST 21 09/06/2014   ALKPHOS 75 09/06/2014   BILITOT 0.6  09/06/2014   Lab Results  Component Value Date   CHOL 191 09/06/2014   Lab Results  Component Value Date   HDL 59.90 09/06/2014   Lab Results  Component Value Date   LDLCALC 102* 09/06/2014   Lab Results  Component Value Date   TRIG 144.0 09/06/2014   Lab Results  Component Value Date   CHOLHDL 3 09/06/2014     Assessment & Plan  GERD Avoid offending foods, start probiotics. Do not eat large meals in late evening and consider raising head of bed.   Hyperlipidemia Tolerating statin, encouraged heart healthy diet, avoid trans fats, minimize simple carbs and saturated fats. Increase exercise as tolerated  Sinus tachycardia RRR today  HTN (hypertension) Well controlled, no changes to meds. Encouraged heart healthy diet such as the DASH diet and exercise as tolerated. Improved on recheck to 130/70

## 2014-09-11 NOTE — Assessment & Plan Note (Signed)
RRR today 

## 2014-09-11 NOTE — Assessment & Plan Note (Signed)
Tolerating statin, encouraged heart healthy diet, avoid trans fats, minimize simple carbs and saturated fats. Increase exercise as tolerated 

## 2014-09-11 NOTE — Assessment & Plan Note (Signed)
Avoid offending foods, start probiotics. Do not eat large meals in late evening and consider raising head of bed.  

## 2014-09-11 NOTE — Assessment & Plan Note (Signed)
Well controlled, no changes to meds. Encouraged heart healthy diet such as the DASH diet and exercise as tolerated. Improved on recheck to 130/70

## 2014-11-09 ENCOUNTER — Other Ambulatory Visit: Payer: Self-pay | Admitting: Family Medicine

## 2014-11-09 ENCOUNTER — Telehealth: Payer: Self-pay | Admitting: Family Medicine

## 2014-11-09 DIAGNOSIS — N949 Unspecified condition associated with female genital organs and menstrual cycle: Secondary | ICD-10-CM

## 2014-11-09 NOTE — Telephone Encounter (Signed)
She will need a new referral from dr b for this  Forwarding to Dr Charlett Blake

## 2014-11-09 NOTE — Telephone Encounter (Signed)
Caller name: Nori, Winegar Relation to pt: self  Call back number:[phone]    Reason for call:  Pt requesting a referral to  Dr. Marney Setting. Ouida Sills, MD gyn pt states she would like a second opinion

## 2014-11-10 NOTE — Telephone Encounter (Signed)
done

## 2014-12-21 DIAGNOSIS — N8110 Cystocele, unspecified: Secondary | ICD-10-CM | POA: Diagnosis not present

## 2014-12-21 DIAGNOSIS — N959 Unspecified menopausal and perimenopausal disorder: Secondary | ICD-10-CM | POA: Diagnosis not present

## 2015-01-05 ENCOUNTER — Ambulatory Visit: Payer: Commercial Managed Care - HMO | Admitting: Family Medicine

## 2015-01-05 DIAGNOSIS — N8110 Cystocele, unspecified: Secondary | ICD-10-CM | POA: Diagnosis not present

## 2015-01-27 ENCOUNTER — Ambulatory Visit (INDEPENDENT_AMBULATORY_CARE_PROVIDER_SITE_OTHER): Payer: Commercial Managed Care - HMO | Admitting: Cardiology

## 2015-01-27 ENCOUNTER — Encounter: Payer: Self-pay | Admitting: Cardiology

## 2015-01-27 VITALS — BP 142/80 | HR 68 | Ht 63.0 in | Wt 127.0 lb

## 2015-01-27 DIAGNOSIS — I471 Supraventricular tachycardia: Secondary | ICD-10-CM

## 2015-01-27 MED ORDER — METOPROLOL SUCCINATE ER 50 MG PO TB24: 50.0000 mg | ORAL_TABLET | Freq: Every day | ORAL | Status: DC

## 2015-01-27 NOTE — Progress Notes (Signed)
Cardiology Office Note   Date:  01/27/2015   ID:  [name], Karina [name] Apr 09, 1933, MRN [id number]  PCP:  Penni Homans, MD  Cardiologist:  Dola Argyle, MD   Chief Complaint  Patient presents with  . Appointment    Follow-up palpitations      History of Present Illness: Karina [name] is a 81 y.o. female who presents today for the follow-up of palpitations. Historically she's had a brief episode of supraventricular tachycardia documented on a Holter. She has not had any documented atrial fibrillation. I made this clear to her today. Overall she's feeling well. She has rare intermittent palpitations.  Past Medical History  Diagnosis Date  . SVT (supraventricular tachycardia) 2007    3-4 beats.. Holter  . Sinus tachycardia     mild-- retesting  . Fatigue 1/11    w/ exercise.. responded to increased beta blocker dose  . Dyslipidemia   . Anemia     iron deficiency  . GERD (gastroesophageal reflux disease)   . Osteoporosis   . Other specified cardiac dysrhythmias(427.89) 09/17/2010  . OSTEOPOROSIS 08/13/2006  . MEASLES, HX OF 01/28/2011  . HYPERLIPIDEMIA 12/17/2007  . GERD 12/28/2007  . HTN (hypertension) 03/06/2011  . DIVERTICULOSIS, COLON 01/28/2011  . CONSTIPATION 01/28/2011  . CHICKENPOX, HX OF 01/28/2011  . ANEMIA, CHRONIC 08/13/2006  . Post menopausal problems 03/06/2011  . Lesion of external ear 03/06/2011  . Sinusitis 12/16/2011  . Hypercholesteremia 03/10/2012  . Grief reaction 03/10/2012  . Ejection fraction     EF 55%, echo, 2007  . Normal cardiac stress test     Nuclear, 2004, normal  . Arthritis     left knee  . Atrophic vaginitis 10/03/2013  . Sinusitis, acute 11/02/2013  . Medicare annual wellness visit, subsequent 05/09/2014    Past Surgical History  Procedure Laterality Date  . Abdominal hysterectomy      w/ unilateral oophorectomy-bleeding, ovarian cysts, left ovary left in place?  . Breast surgery      Implants  . Right knee arthroscopy, cleaned       Patient Active Problem List   Diagnosis Date Noted  . Hematoma of leg 07/20/2014  . Hearing loss 05/09/2014  . Neck pain, chronic 05/09/2014  . Medicare annual wellness visit, subsequent 05/09/2014  . Headache(784.0) 11/02/2013  . Atrophic vaginitis 10/03/2013  . Vaginal discomfort 08/10/2013  . Knee pain, bilateral 02/02/2013  . Injury of adductor muscle and tendon of thigh 01/01/2013  . Pes anserine bursitis 01/01/2013  . Ejection fraction   . SVT (supraventricular tachycardia)   . Sinus tachycardia   . Normal cardiac stress test   . Grief reaction 03/10/2012  . Sinusitis 12/16/2011  . HTN (hypertension) 03/06/2011  . Postmenopausal status 03/06/2011  . Lesion of external ear 03/06/2011  . DIVERTICULOSIS, COLON 01/28/2011  . CONSTIPATION 01/28/2011  . MEASLES, HX OF 01/28/2011  . CHICKENPOX, HX OF 01/28/2011  . GERD 12/28/2007  . Hyperlipidemia 12/17/2007  . ANEMIA, CHRONIC 08/13/2006  . OSTEOPOROSIS 08/13/2006      Current Outpatient Prescriptions  Medication Sig Dispense Refill  . ALPRAZolam (XANAX) 0.5 MG tablet Take 1 tablet (0.5 mg total) by mouth as needed for sleep or anxiety. 1/2 to 2 tabs po bid prn for anxiety, insomnia 90 tablet 0  . aspirin 81 MG tablet Take 81 mg by mouth daily.      Marland Kitchen BIOTIN PO Take 1 tablet by mouth daily.    . Calcium Carbonate-Vitamin D (CALTRATE 600+D) 600-400 MG-UNIT per  tablet Take 1 tablet by mouth 2 (two) times daily.      . Cholecalciferol (VITAMIN D) 1000 UNITS capsule Take 1,000 Units by mouth daily.      Marland Kitchen co-enzyme Q-10 30 MG capsule Take 30 mg by mouth 2 (two) times daily.    Marland Kitchen diltiazem (CARDIZEM) 120 MG tablet TAKE 1 TABLET EVERY DAY 90 tablet 3  . diphenhydramine-acetaminophen (TYLENOL PM) 25-500 MG TABS Take 1 tablet by mouth at bedtime as needed.    . fish oil-omega-3 fatty acids 1000 MG capsule Take 2 g by mouth 3 (three) times daily.      . Lutein 6 MG CAPS Take 1 capsule by mouth daily.      . metoprolol  succinate (TOPROL-XL) 50 MG 24 hr tablet Take 1 tablet (50 mg total) by mouth daily. 90 tablet 1  . PARoxetine (PAXIL) 10 MG tablet Take 10 mg by mouth daily.    . polyethylene glycol powder (MIRALAX) powder Take 17 g by mouth daily as needed. 255 g 0  . Probiotic Product (PEARLS IC PO) Take by mouth.    . simvastatin (ZOCOR) 10 MG tablet Take 1 tablet (10 mg total) by mouth daily at 6 PM. 90 tablet 1  . tolterodine (DETROL) 2 MG tablet Take 2 mg by mouth 2 (two) times daily.     No current facility-administered medications for this visit.    Allergies:   Review of patient's allergies indicates no known allergies.    Social History:  The patient  reports that she has never smoked. She has never used smokeless tobacco. She reports that she does not drink alcohol or use illicit drugs.   Family History:  The patient's family history includes Alcohol abuse in her brother; Cancer in her father, paternal aunt, paternal uncle, and sister; Cancer (age of onset: 89) in her mother; Heart attack in her brother and paternal uncle; Hypertension in her mother and son; Irritable bowel syndrome in her daughter; Lymphoma in her brother; Migraines in her daughter; Mitral valve prolapse in her daughter; Other in her maternal grandmother and mother; Stroke in her daughter and mother; Transient ischemic attack in her mother.    ROS:  Please see the history of present illness.   Patient denies fever, chills, headache, sweats, rash, change in vision, change in hearing, chest pain, cough, nausea or vomiting, urinary symptoms. All other systems are reviewed and are negative.   PHYSICAL EXAM: VS:  BP 142/80 mmHg  Pulse 68  Ht 5\' 3"  (1.6 m)  Wt 127 lb (57.607 kg)  BMI 22.50 kg/m2 , Patient is oriented to person time and place. Affect is normal. Head is atraumatic. Sclera and conjunctiva are normal. There is no jugular venous distention. Lungs are clear. Respiratory effort is nonlabored. Cardiac exam reveals S1 and  S2. Abdomen is soft. Is no peripheral edema. There are no musculoskeletal deformities. There are no skin rashes.   EKG:   EKG is done today and reviewed by me. There is sinus rhythm. There is no significant change from the past.   Recent Labs: 09/06/2014: ALT 14; BUN 23; Creatinine 1.0; Hemoglobin 12.0; Platelets 181.0; Potassium 3.6; Sodium 138; TSH 1.81    Lipid Panel    Component Value Date/Time   CHOL 191 09/06/2014 1119   TRIG 144.0 09/06/2014 1119   HDL 59.90 09/06/2014 1119   CHOLHDL 3 09/06/2014 1119   VLDL 28.8 09/06/2014 1119   LDLCALC 102* 09/06/2014 1119   LDLDIRECT 156.5 03/10/2012 8676  Wt Readings from Last 3 Encounters:  01/27/15 127 lb (57.607 kg)  09/06/14 124 lb (56.246 kg)  07/20/14 124 lb 6 oz (56.416 kg)      Current medicines are reviewed       ASSESSMENT AND PLAN:

## 2015-01-27 NOTE — Patient Instructions (Signed)
**Note De-identified Karina [name] Obfuscation** Your physician recommends that you continue on your current medications as directed. Please refer to the Current Medication list given to you today.  Your physician wants you to follow-up in: 1 year. You will receive a reminder letter in the mail two months in advance. If you don't receive a letter, please call our office to schedule the follow-up appointment.  

## 2015-01-27 NOTE — Assessment & Plan Note (Signed)
Historically the patient has had some palpitations. She's had some sinus tachycardia. On one Holter she had 4 beats of supraventricular tachycardia. There is no documented atrial fibrillation. I carefully explained this to her today. There is no reason to change her medicines. There is no indication for anticoagulation.  Patient is aware that I will be retiring at the end of September, 2016. She knows that our team will make her a follow-up appointment for the future with one of our team members.

## 2015-03-13 ENCOUNTER — Other Ambulatory Visit: Payer: Self-pay | Admitting: Family Medicine

## 2015-03-13 ENCOUNTER — Telehealth: Payer: Self-pay | Admitting: Family Medicine

## 2015-03-13 DIAGNOSIS — N8110 Cystocele, unspecified: Secondary | ICD-10-CM

## 2015-03-13 NOTE — Telephone Encounter (Signed)
Relation to pt: self  Call back number: [phone]   Reason for call:  Pt requesting a referral with DR. [facility] due to bladder dropping. Please advise

## 2015-03-16 ENCOUNTER — Ambulatory Visit (INDEPENDENT_AMBULATORY_CARE_PROVIDER_SITE_OTHER): Payer: Commercial Managed Care - HMO | Admitting: Obstetrics and Gynecology

## 2015-03-16 ENCOUNTER — Encounter: Payer: Self-pay | Admitting: Obstetrics and Gynecology

## 2015-03-16 VITALS — BP 140/76 | HR 60 | Resp 16 | Ht 63.0 in | Wt 128.8 lb

## 2015-03-16 DIAGNOSIS — L989 Disorder of the skin and subcutaneous tissue, unspecified: Secondary | ICD-10-CM

## 2015-03-16 DIAGNOSIS — N993 Prolapse of vaginal vault after hysterectomy: Secondary | ICD-10-CM

## 2015-03-16 DIAGNOSIS — N8110 Cystocele, unspecified: Secondary | ICD-10-CM

## 2015-03-16 DIAGNOSIS — IMO0002 Reserved for concepts with insufficient information to code with codable children: Secondary | ICD-10-CM

## 2015-03-16 LAB — POCT URINALYSIS DIPSTICK
BILIRUBIN UA: NEGATIVE
Glucose, UA: NEGATIVE
Ketones, UA: NEGATIVE
Leukocytes, UA: NEGATIVE
Nitrite, UA: NEGATIVE
PH UA: 5
PROTEIN UA: NEGATIVE
RBC UA: NEGATIVE
Urobilinogen, UA: NEGATIVE

## 2015-03-16 NOTE — Progress Notes (Signed)
Patient ID: Karina [name], female   DOB: 1933/10/17, 81 y.o.   MRN: [id number] GYNECOLOGY  VISIT   HPI: 81 y.o.   Married  Caucasian  female   [phone] with No LMP recorded. Patient has had a hysterectomy.   here for evaluation of bladder prolapse. Feels prolapse and saw PCP who told her it is nothing to worry about.  Saw GYN, Dr. Harrington Challenger and told her she had bladder prolapse.  She has previously seen Dr. Julien Girt for prolapse and was fitted for a pessary.  Has tried two sizes already and had difficulty with using them, either they were too hard to remove or painful to do so.  Declines further pessary use.   Difficulty starting flow and completing void.  Was leaking in the past but now really now unless waits a long time to void.  No leak for no reason.  DF - varies on fluid intake.  5 times per day. NF - 1 - 2 times per night.   Some difficulty with BMs.  No perineal splinting.  Uses Miralax, which is helpful.  No hx of UTIs, stones, or blood in urine.   Status post TAH/RSO for ovarian cysts and endometriosis. - Dr. Ree Edman.  Thinks she has her left ovary in place.  Uses Premarin cream 1/4 gram once per week.   GYNECOLOGIC HISTORY: No LMP recorded. Patient has had a hysterectomy. Contraception:  hysterectomy  Menopausal hormone therapy: none Last pap:  04-15-13 normal:no HPV testing done Last mammogram:  06-24-14 implants/heterogeneously dense/nl:[facility]   Urine Dip:  Neg     OB History    Gravida Para Term Preterm AB TAB SAB Ectopic Multiple Living   3 2 2  1  1   1       Obstetric Comments   Daughter died at age 23 defective heart valve         Patient Active Problem List   Diagnosis Date Noted  . Hematoma of leg 07/20/2014  . Hearing loss 05/09/2014  . Neck pain, chronic 05/09/2014  . Medicare annual wellness visit, subsequent 05/09/2014  . Headache(784.0) 11/02/2013  . Atrophic vaginitis 10/03/2013  . Vaginal discomfort 08/10/2013  . Knee pain, bilateral 02/02/2013   . Injury of adductor muscle and tendon of thigh 01/01/2013  . Pes anserine bursitis 01/01/2013  . Ejection fraction   . SVT (supraventricular tachycardia)   . Sinus tachycardia   . Normal cardiac stress test   . Grief reaction 03/10/2012  . Sinusitis 12/16/2011  . HTN (hypertension) 03/06/2011  . Postmenopausal status 03/06/2011  . Lesion of external ear 03/06/2011  . DIVERTICULOSIS, COLON 01/28/2011  . CONSTIPATION 01/28/2011  . MEASLES, HX OF 01/28/2011  . CHICKENPOX, HX OF 01/28/2011  . GERD 12/28/2007  . Hyperlipidemia 12/17/2007  . ANEMIA, CHRONIC 08/13/2006  . OSTEOPOROSIS 08/13/2006    Past Medical History  Diagnosis Date  . SVT (supraventricular tachycardia) 2007    3-4 beats.. Holter  . Sinus tachycardia     mild-- retesting  . Fatigue 1/11    w/ exercise.. responded to increased beta blocker dose  . Dyslipidemia   . Anemia     iron deficiency  . GERD (gastroesophageal reflux disease)   . Osteoporosis   . Other specified cardiac dysrhythmias(427.89) 09/17/2010  . OSTEOPOROSIS 08/13/2006  . MEASLES, HX OF 01/28/2011  . HYPERLIPIDEMIA 12/17/2007  . GERD 12/28/2007  . HTN (hypertension) 03/06/2011  . DIVERTICULOSIS, COLON 01/28/2011  . CONSTIPATION 01/28/2011  . CHICKENPOX, HX OF 01/28/2011  .  ANEMIA, CHRONIC 08/13/2006  . Post menopausal problems 03/06/2011  . Lesion of external ear 03/06/2011  . Sinusitis 12/16/2011  . Hypercholesteremia 03/10/2012  . Grief reaction 03/10/2012  . Ejection fraction     EF 55%, echo, 2007  . Normal cardiac stress test     Nuclear, 2004, normal  . Arthritis     left knee  . Atrophic vaginitis 10/03/2013  . Sinusitis, acute 11/02/2013  . Medicare annual wellness visit, subsequent 05/09/2014  . Endometriosis     Past Surgical History  Procedure Laterality Date  . Abdominal hysterectomy      w/ unilateral oophorectomy-bleeding, ovarian cysts, left ovary left in place?  . Right knee arthroscopy, cleaned    . Breast surgery       Implants--Silicone    Current Outpatient Prescriptions  Medication Sig Dispense Refill  . ALPRAZolam (XANAX) 0.5 MG tablet Take 1 tablet (0.5 mg total) by mouth as needed for sleep or anxiety. 1/2 to 2 tabs po bid prn for anxiety, insomnia 90 tablet 0  . aspirin 81 MG tablet Take 81 mg by mouth daily.      Marland Kitchen BIOTIN PO Take 1 tablet by mouth daily.    . Calcium Carbonate-Vitamin D (CALTRATE 600+D) 600-400 MG-UNIT per tablet Take 1 tablet by mouth 2 (two) times daily.      . Cholecalciferol (VITAMIN D) 1000 UNITS capsule Take 1,000 Units by mouth daily.      Marland Kitchen co-enzyme Q-10 30 MG capsule Take 30 mg by mouth 2 (two) times daily.    Marland Kitchen diltiazem (CARDIZEM) 120 MG tablet TAKE 1 TABLET EVERY DAY 90 tablet 3  . diphenhydramine-acetaminophen (TYLENOL PM) 25-500 MG TABS Take 1 tablet by mouth at bedtime as needed.    . fish oil-omega-3 fatty acids 1000 MG capsule Take 2 g by mouth 3 (three) times daily.      . Lutein 6 MG CAPS Take 1 capsule by mouth daily.      . metoprolol succinate (TOPROL-XL) 50 MG 24 hr tablet Take 1 tablet (50 mg total) by mouth daily. 90 tablet 3  . PARoxetine (PAXIL) 10 MG tablet Take 10 mg by mouth daily.    . polyethylene glycol powder (MIRALAX) powder Take 17 g by mouth daily as needed. 255 g 0  . Probiotic Product (PEARLS IC PO) Take by mouth.    . simvastatin (ZOCOR) 10 MG tablet Take 1 tablet (10 mg total) by mouth daily at 6 PM. 90 tablet 1  . tolterodine (DETROL) 2 MG tablet Take 2 mg by mouth 2 (two) times daily.     No current facility-administered medications for this visit.     ALLERGIES: Review of patient's allergies indicates no known allergies.  Family History  Problem Relation Age of Onset  . Stroke Mother   . Hypertension Mother   . Other Mother     CHF  . Transient ischemic attack Mother   . Cancer Mother 38    cervical  . Cancer Father     stomach, smoker  . Cancer Sister     brain  . Lymphoma Brother   . Cancer Brother   . Stroke Daughter      ? stroke  . Mitral valve prolapse Daughter   . Irritable bowel syndrome Daughter   . Migraines Daughter   . Hypertension Son   . Cancer Paternal Aunt     cancer  . Cancer Paternal Uncle     X 2 CA  . Heart  attack Paternal Uncle   . Other Maternal Grandmother     CHF  . Alcohol abuse Brother   . Heart attack Brother     smoker    History   Social History  . Marital Status: Married    Spouse Name: N/A  . Number of Children: N/A  . Years of Education: N/A   Occupational History  . Not on file.   Social History Main Topics  . Smoking status: Never Smoker   . Smokeless tobacco: Never Used  . Alcohol Use: No  . Drug Use: No  . Sexual Activity: No     Comment: TAH--Poss. left ovary remains   Other Topics Concern  . Not on file   Social History Narrative    ROS:  Pertinent items are noted in HPI.  PHYSICAL EXAMINATION:    BP 140/76 mmHg  Pulse 60  Resp 16  Ht 5\' 3"  (1.6 m)  Wt 128 lb 12.8 oz (58.423 kg)  BMI 22.82 kg/m2     General appearance: alert, cooperative and appears stated age Lungs: clear to auscultation bilaterally Heart: regular rate and rhythm Abdomen: soft, non-tender; no masses,  no organomegaly No abnormal inguinal nodes palpated  Pelvic: External genitalia:   Hypopigmentation of right labia and perineum.               Urethra:  normal appearing urethra with no masses, tenderness or lesions              Bartholins and Skenes: normal                 Vagina: normal appearing vagina with normal color and discharge, no lesions, minimal cystocele.              Cervix: normal appearance                   Bimanual Exam:  Uterus:  uterus is normal size, shape, consistency and nontender                                      Adnexa: normal adnexa in size, nontender and no masses                                      Rectovaginal:  Yes.                                                                Confirms                                      Anus:   normal sphincter tone, no lesions  ASSESSMENT  Status post TAH/RSO for ovarian cysts and endometriosis.  Minimal cystocele.  Did not tolerate pessary.  Mild incontinence. Using local vaginal estrogen.  Vulvar lesions. I suspect lichen sclerosus.   PLAN  Discussion of pelvic organ prolapse and incontinence.  ACOG reading materials to patient.  Discussed observational management, pessary use, and surgical care. Patient prefers observational management.  Physical therapy may be  added in future if desired. Discussion of lichen sclerosus. Return for vulvar biopsy and potential Rx for Clobetasol.  Return prn.   An After Visit Summary was printed and given to the patient.  __30____ minutes face to face time of which over 50% was spent in counseling.

## 2015-03-16 NOTE — Patient Instructions (Signed)
About Cystocele  Overview  The pelvic organs, including the bladder, are normally supported by pelvic floor muscles and ligaments.  When these muscles and ligaments are stretched, weakened or torn, the wall between the bladder and the vagina sags or herniates causing a prolapse, sometimes called a cystocele.  This condition may cause discomfort and problems with emptying the bladder.  It can be present in various stages.  Some people are not aware of the changes.  Others may notice changes at the vaginal opening or a feeling of the bladder dropping outside the body.  Causes of a Cystocele  A cystocele is usually caused by muscle straining or stretching during childbirth.  In addition, cystocele is more common after menopause, because the hormone estrogen helps keep the elastic tissues around the pelvic organs strong.  A cystocele is more likely to occur when levels of estrogen decrease.  Other causes include: heavy lifting, chronic coughing, previous pelvic surgery and obesity.  Symptoms  A bladder that has dropped from its normal position may cause: unwanted urine leakage (stress incontinence), frequent urination or urge to urinate, incomplete emptying of the bladder (not feeling bladder relief after emptying), pain or discomfort in the vagina, pelvis, groin, lower back or lower abdomen and frequent urinary tract infections.  Mild cases may not cause any symptoms.  Treatment Options  Pelvic floor (Kegel) exercises:  Strength training the muscles in your genital area  Behavioral changes: Treating and preventing constipation, taking time to empty your bladder properly, learning to lift properly and/or avoid heavy lifting when possible, stopping smoking, avoiding weight gain and treating a chronic cough or bronchitis.  A pessary: A vaginal support device is sometimes used to help pelvic support caused by muscle and ligament changes.  Surgery: Surgical repair may be necessary if symptoms cannot  be managed with exercise, behavioral changes and a pessary.  Surgery is usually considered for severe cases.   2007, [employer]  Lichen Sclerosus Lichen sclerosus is a skin problem. It can happen on any part of the body, but it commonly involves the anal or genital areas. Lichen sclerosus is not an infection or a fungus. Girls and women are more commonly affected than boys and men. CAUSES The cause is not known. It could be the result of an overactive immune system or a lack of certain hormones. Lichen sclerosus is not passed from one person to another (not contagious). SYMPTOMS Your skin may have:  Thin, wrinkled, white areas.  Thickened white areas.  Red and swollen patches.  Tears or cracks.  Bruising.  Blood blisters.  Severe itching. You may also have pain, itching, or burning with urination. Constipation is also common in people with lichen sclerosus. DIAGNOSIS Your caregiver will do a physical exam. Sometimes, a tissue sample (biopsy) may be sent for testing. TREATMENT Treatment may involve putting a thin layer of medicated cream (topical steroid) over the areas with lichen sclerosus. Use the cream only as directed by your caregiver.  HOME CARE INSTRUCTIONS  Only take over-the-counter or prescription medicines as directed by your caregiver.  Keep the vaginal area as clean and dry as possible. SEEK MEDICAL CARE IF: You develop increasing pain, swelling, or redness. Document Released: 03/20/2011 Document Revised: 01/20/2012 Document Reviewed: 03/20/2011 [facility] Patient Information 2015 [place], [place]. This information is not intended to replace advice given to you by your health care provider. Make sure you discuss any questions you have with your health care provider.

## 2015-03-19 ENCOUNTER — Encounter: Payer: Self-pay | Admitting: Obstetrics and Gynecology

## 2015-03-21 NOTE — Telephone Encounter (Signed)
Returned call

## 2015-03-21 NOTE — Telephone Encounter (Signed)
I spoke with Marge. Advised that per Katharine Look w/ [facility], a referral is required and prior Josem Kaufmann is required to our office for a vulvar biopsy for this patient. She entered a referral into the [employer]. The approval code is [id number]. The referral is approved for 6 months/6 visits. Prior auth request was entered for CPT: [phone] DX: L98.9. The approval code is [id number].

## 2015-03-21 NOTE — Telephone Encounter (Signed)
Spoke with patient. Advised of benefit quote received for vulvar biopsy.  Patient agreeable. Scheduled appt. Advised patient of 72 hour cancellation policy and $361 cancellation fee. Patient agreeable.

## 2015-03-21 NOTE — Telephone Encounter (Signed)
Left message for patient to call back. Need to go over benefit information received from health plan for vulvar biopsy

## 2015-03-28 ENCOUNTER — Other Ambulatory Visit: Payer: Self-pay | Admitting: Family Medicine

## 2015-03-30 ENCOUNTER — Encounter: Payer: Self-pay | Admitting: Obstetrics and Gynecology

## 2015-03-30 ENCOUNTER — Ambulatory Visit (INDEPENDENT_AMBULATORY_CARE_PROVIDER_SITE_OTHER): Payer: Commercial Managed Care - HMO | Admitting: Obstetrics and Gynecology

## 2015-03-30 VITALS — BP 114/70 | HR 66 | Ht 63.0 in | Wt 130.6 lb

## 2015-03-30 DIAGNOSIS — N9089 Other specified noninflammatory disorders of vulva and perineum: Secondary | ICD-10-CM | POA: Diagnosis not present

## 2015-03-30 DIAGNOSIS — L989 Disorder of the skin and subcutaneous tissue, unspecified: Secondary | ICD-10-CM

## 2015-03-30 DIAGNOSIS — N904 Leukoplakia of vulva: Secondary | ICD-10-CM | POA: Diagnosis not present

## 2015-03-30 NOTE — Patient Instructions (Signed)

## 2015-03-30 NOTE — Progress Notes (Signed)
Patient ID: Karina [name], female   DOB: 1933/07/05, 81 y.o.   MRN: [id number] GYNECOLOGY  VISIT   HPI: 81 y.o.   Married  Caucasian  female   [phone] with No LMP recorded. Patient has had a hysterectomy.   here for vulvar biopsy.  Vulva can be painful and itch.   Has vaginal estrogen.   Declines physical therapy.   GYNECOLOGIC HISTORY: No LMP recorded. Patient has had a hysterectomy. Contraception:  Hysterectomy Menopausal hormone therapy: Premarin vaginal cream Last mammogram: 06-24-14 implants/dense/nl:[facility] Last pap smear: 04-15-13 normal:no HPV testing done        OB History    Gravida Para Term Preterm AB TAB SAB Ectopic Multiple Living   3 2 2  1  1   1       Obstetric Comments   Daughter died at age 92 defective heart valve         Patient Active Problem List   Diagnosis Date Noted  . Hematoma of leg 07/20/2014  . Hearing loss 05/09/2014  . Neck pain, chronic 05/09/2014  . Medicare annual wellness visit, subsequent 05/09/2014  . Headache(784.0) 11/02/2013  . Atrophic vaginitis 10/03/2013  . Vaginal discomfort 08/10/2013  . Knee pain, bilateral 02/02/2013  . Injury of adductor muscle and tendon of thigh 01/01/2013  . Pes anserine bursitis 01/01/2013  . Ejection fraction   . SVT (supraventricular tachycardia)   . Sinus tachycardia   . Normal cardiac stress test   . Grief reaction 03/10/2012  . Sinusitis 12/16/2011  . HTN (hypertension) 03/06/2011  . Postmenopausal status 03/06/2011  . Lesion of external ear 03/06/2011  . DIVERTICULOSIS, COLON 01/28/2011  . CONSTIPATION 01/28/2011  . MEASLES, HX OF 01/28/2011  . CHICKENPOX, HX OF 01/28/2011  . GERD 12/28/2007  . Hyperlipidemia 12/17/2007  . ANEMIA, CHRONIC 08/13/2006  . OSTEOPOROSIS 08/13/2006    Past Medical History  Diagnosis Date  . SVT (supraventricular tachycardia) 2007    3-4 beats.. Holter  . Sinus tachycardia     mild-- retesting  . Fatigue 1/11    w/ exercise.. responded to increased beta  blocker dose  . Dyslipidemia   . Anemia     iron deficiency  . GERD (gastroesophageal reflux disease)   . Osteoporosis   . Other specified cardiac dysrhythmias(427.89) 09/17/2010  . OSTEOPOROSIS 08/13/2006  . MEASLES, HX OF 01/28/2011  . HYPERLIPIDEMIA 12/17/2007  . GERD 12/28/2007  . HTN (hypertension) 03/06/2011  . DIVERTICULOSIS, COLON 01/28/2011  . CONSTIPATION 01/28/2011  . CHICKENPOX, HX OF 01/28/2011  . ANEMIA, CHRONIC 08/13/2006  . Post menopausal problems 03/06/2011  . Lesion of external ear 03/06/2011  . Sinusitis 12/16/2011  . Hypercholesteremia 03/10/2012  . Grief reaction 03/10/2012  . Ejection fraction     EF 55%, echo, 2007  . Normal cardiac stress test     Nuclear, 2004, normal  . Arthritis     left knee  . Atrophic vaginitis 10/03/2013  . Sinusitis, acute 11/02/2013  . Medicare annual wellness visit, subsequent 05/09/2014  . Endometriosis     Past Surgical History  Procedure Laterality Date  . Abdominal hysterectomy      w/ unilateral oophorectomy-bleeding, ovarian cysts, left ovary left in place?  . Right knee arthroscopy, cleaned    . Breast surgery      Implants--Silicone    Current Outpatient Prescriptions  Medication Sig Dispense Refill  . ALPRAZolam (XANAX) 0.5 MG tablet Take 1 tablet (0.5 mg total) by mouth as needed for sleep or anxiety. 1/2 to  2 tabs po bid prn for anxiety, insomnia 90 tablet 0  . aspirin 81 MG tablet Take 81 mg by mouth daily.      Marland Kitchen BIOTIN PO Take 1 tablet by mouth daily.    . Calcium Carbonate-Vitamin D (CALTRATE 600+D) 600-400 MG-UNIT per tablet Take 1 tablet by mouth 2 (two) times daily.      . Cholecalciferol (VITAMIN D) 1000 UNITS capsule Take 1,000 Units by mouth daily.      Marland Kitchen co-enzyme Q-10 30 MG capsule Take 30 mg by mouth 2 (two) times daily.    Marland Kitchen conjugated estrogens (PREMARIN) vaginal cream Place 1 Applicatorful vaginally daily. 1/4 gram per vagina once weekly.    Marland Kitchen diltiazem (CARDIZEM) 120 MG tablet TAKE 1 TABLET EVERY DAY 90  tablet 3  . diphenhydramine-acetaminophen (TYLENOL PM) 25-500 MG TABS Take 1 tablet by mouth at bedtime as needed.    . fish oil-omega-3 fatty acids 1000 MG capsule Take 2 g by mouth 3 (three) times daily.      . Lutein 6 MG CAPS Take 1 capsule by mouth daily.      . metoprolol succinate (TOPROL-XL) 50 MG 24 hr tablet Take 1 tablet (50 mg total) by mouth daily. 90 tablet 3  . PARoxetine (PAXIL) 10 MG tablet Take 10 mg by mouth daily.    . polyethylene glycol powder (MIRALAX) powder Take 17 g by mouth daily as needed. 255 g 0  . Probiotic Product (PEARLS IC PO) Take by mouth.    . simvastatin (ZOCOR) 10 MG tablet TAKE 1 TABLET (10 MG TOTAL) BY MOUTH DAILY AT 6 PM. 90 tablet 1  . tolterodine (DETROL) 2 MG tablet Take 2 mg by mouth 2 (two) times daily.     No current facility-administered medications for this visit.     ALLERGIES: Review of patient's allergies indicates no known allergies.  Family History  Problem Relation Age of Onset  . Stroke Mother   . Hypertension Mother   . Other Mother     CHF  . Transient ischemic attack Mother   . Cancer Mother 26    cervical  . Cancer Father     stomach, smoker  . Cancer Sister     brain  . Lymphoma Brother   . Cancer Brother   . Stroke Daughter     ? stroke  . Mitral valve prolapse Daughter   . Irritable bowel syndrome Daughter   . Migraines Daughter   . Hypertension Son   . Cancer Paternal Aunt     cancer  . Cancer Paternal Uncle     X 2 CA  . Heart attack Paternal Uncle   . Other Maternal Grandmother     CHF  . Alcohol abuse Brother   . Heart attack Brother     smoker    History   Social History  . Marital Status: Married    Spouse Name: N/A  . Number of Children: N/A  . Years of Education: N/A   Occupational History  . Not on file.   Social History Main Topics  . Smoking status: Never Smoker   . Smokeless tobacco: Never Used  . Alcohol Use: No  . Drug Use: No  . Sexual Activity: No     Comment: TAH--Poss.  left ovary remains   Other Topics Concern  . Not on file   Social History Narrative    ROS:  Pertinent items are noted in HPI.  PHYSICAL EXAMINATION:    BP  114/70 mmHg  Pulse 66  Ht 5\' 3"  (1.6 m)  Wt 130 lb 9.6 oz (59.24 kg)  BMI 23.14 kg/m2    General appearance: alert, cooperative and appears stated age  Pelvic: External genitalia:  No significant distinction between labia majora and minora bilaterally.  Whitish grey change to epithelium above the clitoris bilaterally.                                               1 cm white patch of the right mid labia.  Site of biopsy.               Urethra:  normal appearing urethra with no masses, tenderness or lesions                Chaperone was present for exam.  ASSESSMENT  Vulvar lesions.   Possible lichen sclerosus.  PLAN  Biopsy recommended and performed.   See other note with today's date with drawing and procedure.    An After Visit Summary was printed and given to the patient.

## 2015-04-01 NOTE — Progress Notes (Signed)
Subjective:     Patient ID: Karina [name], female   DOB: April 20, 1933, 81 y.o.   MRN: [id number]  HPI  Patient here for vulvar biopsy for itching and irritation.  Hypopigmentation noted on recent exam.  Review of Systems  See other note.     Objective:   Physical Exam  Genitourinary:      Vulvar biopsy.  Consent for procedure.  Prep with Hibiclens.  Local 1% lidocaine - lot [id number] - Exp 11/12/15 3 mm punch biopsy.  Tissue to pathology.  AgNO3 used.  Good hemostasis.  Minimal EBL.  No complications.     Assessment:     Vulvar hypopigmentation.  Vulvitis.     Plan:     Instructions and precautions given. Follow up biopsies. I anticipated a diagnosis of lichen sclerosus and treatment with Clobetasol ointment 0.05% to area twice daily for one month.  I have already showed the patient the areas to be treated and how much to apply.  No Rx will be given until biopsy back and reviewed. Patient understands that this is a high potency steroid used under clear direction.  Recheck after one month of treatment anticipated.

## 2015-04-06 ENCOUNTER — Telehealth: Payer: Self-pay | Admitting: Emergency Medicine

## 2015-04-06 MED ORDER — CLOBETASOL PROPIONATE 0.05 % EX OINT: 1.0000 "application " | TOPICAL_OINTMENT | Freq: Two times a day (BID) | CUTANEOUS | Status: DC

## 2015-04-06 NOTE — Telephone Encounter (Signed)
-----   Message from Nunzio Cobbs, MD sent at 04/06/2015  6:48 AM EDT ----- Please contact patient with biopsy results which confirmed lichen sclerosus.  Please send Rx for Clobetasol 0.05% to her pharmacy of choice.  Clobetasol 0.05% to area in thin layer bid for one month.   Disp - one tube, RF one.  Patient already instructed in use in the office.  Patient already has a recheck for 05/04/15.  Cc- Marisa Sprinkles

## 2015-04-06 NOTE — Telephone Encounter (Signed)
Called mobile, no answer, unable to leave voicemail.  Called home, no answer, Message left to return call to [place] at [phone]. Advised message is not urgent.  Patient has 3 pharmacies on file, need to know what pharmacy patient prefers.

## 2015-04-06 NOTE — Telephone Encounter (Signed)
Patient returned call. Message from Dr. Quincy Simmonds given. Patient verbalized understanding of message with results and plan of care and instructions for use of Clobetasol. She will follow up as scheduled or return call if symptoms not improving. Routing to provider for final review. Patient agreeable to disposition. Will close encounter.

## 2015-04-19 LAB — IPS OTHER TISSUE BIOPSY

## 2015-05-04 ENCOUNTER — Other Ambulatory Visit: Payer: Self-pay | Admitting: *Deleted

## 2015-05-04 ENCOUNTER — Ambulatory Visit (INDEPENDENT_AMBULATORY_CARE_PROVIDER_SITE_OTHER): Payer: Commercial Managed Care - HMO | Admitting: Obstetrics and Gynecology

## 2015-05-04 ENCOUNTER — Encounter: Payer: Self-pay | Admitting: Obstetrics and Gynecology

## 2015-05-04 VITALS — BP 138/82 | HR 72 | Wt 128.8 lb

## 2015-05-04 DIAGNOSIS — N3281 Overactive bladder: Secondary | ICD-10-CM | POA: Diagnosis not present

## 2015-05-04 DIAGNOSIS — L900 Lichen sclerosus et atrophicus: Secondary | ICD-10-CM

## 2015-05-04 DIAGNOSIS — N951 Menopausal and female climacteric states: Secondary | ICD-10-CM | POA: Diagnosis not present

## 2015-05-04 MED ORDER — PAROXETINE HCL 10 MG PO TABS: 10.0000 mg | ORAL_TABLET | Freq: Every day | ORAL | Status: DC

## 2015-05-04 MED ORDER — OXYBUTYNIN CHLORIDE 5 MG PO TABS: 2.5000 mg | ORAL_TABLET | Freq: Two times a day (BID) | ORAL | Status: DC

## 2015-05-04 NOTE — Progress Notes (Signed)
GYNECOLOGY  VISIT   HPI: 81 y.o.   Married  Caucasian  female   [phone] with No LMP recorded. Patient has had a hysterectomy.   here for 5 week recheck of lichen sclerosus. Also has other concerns and is asking for discussion of Paxil and Detrol.  Using clobetasol ointment twice a day.  No further itching or pain.   Taking Paxil for 3 - 4 months for hot flashes.  Rx by Dr. Gus Height. Asking if this is the right medication for her. Seldomly has hot flashes since starting Paxil.  Gained 10 pounds over 6 months.  Not sexually active.  Some fatigue and difficulty sleeping.  Able to function well.  Occasionally takes a nap.   Also taking Detrol.  States it is expensive.  Is reducing frequency of voiding.  DF - 4 -5 times total. NF- 0 - 2 times per night.  No leakage of urine.  Some dry mouth. Some constipation.  Takes Miralax and this works well.  GYNECOLOGIC HISTORY: No LMP recorded. Patient has had a hysterectomy. Contraception: Hysterectomy Menopausal hormone therapy: Premarin Vaginal Cream Last mammogram: 06/24/14 bi-rads 1: negative Last pap smear: 04/15/13 wnl        OB History    Gravida Para Term Preterm AB TAB SAB Ectopic Multiple Living   3 2 2  1  1   1       Obstetric Comments   Daughter died at age 2 defective heart valve         Patient Active Problem List   Diagnosis Date Noted  . Hematoma of leg 07/20/2014  . Hearing loss 05/09/2014  . Neck pain, chronic 05/09/2014  . Medicare annual wellness visit, subsequent 05/09/2014  . Headache(784.0) 11/02/2013  . Atrophic vaginitis 10/03/2013  . Vaginal discomfort 08/10/2013  . Knee pain, bilateral 02/02/2013  . Injury of adductor muscle and tendon of thigh 01/01/2013  . Pes anserine bursitis 01/01/2013  . Ejection fraction   . SVT (supraventricular tachycardia)   . Sinus tachycardia   . Normal cardiac stress test   . Grief reaction 03/10/2012  . Sinusitis 12/16/2011  . HTN (hypertension) 03/06/2011  .  Postmenopausal status 03/06/2011  . Lesion of external ear 03/06/2011  . DIVERTICULOSIS, COLON 01/28/2011  . CONSTIPATION 01/28/2011  . MEASLES, HX OF 01/28/2011  . CHICKENPOX, HX OF 01/28/2011  . GERD 12/28/2007  . Hyperlipidemia 12/17/2007  . ANEMIA, CHRONIC 08/13/2006  . OSTEOPOROSIS 08/13/2006    Past Medical History  Diagnosis Date  . SVT (supraventricular tachycardia) 2007    3-4 beats.. Holter  . Sinus tachycardia     mild-- retesting  . Fatigue 1/11    w/ exercise.. responded to increased beta blocker dose  . Dyslipidemia   . Anemia     iron deficiency  . GERD (gastroesophageal reflux disease)   . Osteoporosis   . Other specified cardiac dysrhythmias(427.89) 09/17/2010  . OSTEOPOROSIS 08/13/2006  . MEASLES, HX OF 01/28/2011  . HYPERLIPIDEMIA 12/17/2007  . GERD 12/28/2007  . HTN (hypertension) 03/06/2011  . DIVERTICULOSIS, COLON 01/28/2011  . CONSTIPATION 01/28/2011  . CHICKENPOX, HX OF 01/28/2011  . ANEMIA, CHRONIC 08/13/2006  . Post menopausal problems 03/06/2011  . Lesion of external ear 03/06/2011  . Sinusitis 12/16/2011  . Hypercholesteremia 03/10/2012  . Grief reaction 03/10/2012  . Ejection fraction     EF 55%, echo, 2007  . Normal cardiac stress test     Nuclear, 2004, normal  . Arthritis     left knee  .  Atrophic vaginitis 10/03/2013  . Sinusitis, acute 11/02/2013  . Medicare annual wellness visit, subsequent 05/09/2014  . Endometriosis     Past Surgical History  Procedure Laterality Date  . Abdominal hysterectomy      w/ unilateral oophorectomy-bleeding, ovarian cysts, left ovary left in place?  . Right knee arthroscopy, cleaned    . Breast surgery      Implants--Silicone    Current Outpatient Prescriptions  Medication Sig Dispense Refill  . ALPRAZolam (XANAX) 0.5 MG tablet Take 1 tablet (0.5 mg total) by mouth as needed for sleep or anxiety. 1/2 to 2 tabs po bid prn for anxiety, insomnia 90 tablet 0  . aspirin 81 MG tablet Take 81 mg by mouth daily.       Marland Kitchen BIOTIN PO Take 1 tablet by mouth daily.    . Calcium Carbonate-Vitamin D (CALTRATE 600+D) 600-400 MG-UNIT per tablet Take 1 tablet by mouth 2 (two) times daily.      . Cholecalciferol (VITAMIN D) 1000 UNITS capsule Take 1,000 Units by mouth daily.      . clobetasol ointment (TEMOVATE) 9.37 % Apply 1 application topically 2 (two) times daily. Use for one month only. Apply thin layer. 30 g 1  . co-enzyme Q-10 30 MG capsule Take 30 mg by mouth 2 (two) times daily.    Marland Kitchen conjugated estrogens (PREMARIN) vaginal cream Place 1 Applicatorful vaginally daily. 1/4 gram per vagina once weekly.    Marland Kitchen diltiazem (CARDIZEM) 120 MG tablet TAKE 1 TABLET EVERY DAY 90 tablet 3  . diphenhydramine-acetaminophen (TYLENOL PM) 25-500 MG TABS Take 1 tablet by mouth at bedtime as needed.    . fish oil-omega-3 fatty acids 1000 MG capsule Take 2 g by mouth 3 (three) times daily.      . Lutein 6 MG CAPS Take 1 capsule by mouth daily.      . metoprolol succinate (TOPROL-XL) 50 MG 24 hr tablet Take 1 tablet (50 mg total) by mouth daily. 90 tablet 3  . PARoxetine (PAXIL) 10 MG tablet Take 10 mg by mouth daily.    . polyethylene glycol powder (MIRALAX) powder Take 17 g by mouth daily as needed. 255 g 0  . Probiotic Product (PEARLS IC PO) Take by mouth.    . simvastatin (ZOCOR) 10 MG tablet TAKE 1 TABLET (10 MG TOTAL) BY MOUTH DAILY AT 6 PM. 90 tablet 1  . tolterodine (DETROL) 2 MG tablet Take 2 mg by mouth 2 (two) times daily.     No current facility-administered medications for this visit.     ALLERGIES: Review of patient's allergies indicates no known allergies.  Family History  Problem Relation Age of Onset  . Stroke Mother   . Hypertension Mother   . Other Mother     CHF  . Transient ischemic attack Mother   . Cancer Mother 21    cervical  . Cancer Father     stomach, smoker  . Cancer Sister     brain  . Lymphoma Brother   . Cancer Brother   . Stroke Daughter     ? stroke  . Mitral valve prolapse  Daughter   . Irritable bowel syndrome Daughter   . Migraines Daughter   . Hypertension Son   . Cancer Paternal Aunt     cancer  . Cancer Paternal Uncle     X 2 CA  . Heart attack Paternal Uncle   . Other Maternal Grandmother     CHF  . Alcohol abuse Brother   .  Heart attack Brother     smoker    History   Social History  . Marital Status: Married    Spouse Name: N/A  . Number of Children: N/A  . Years of Education: N/A   Occupational History  . Not on file.   Social History Main Topics  . Smoking status: Never Smoker   . Smokeless tobacco: Never Used  . Alcohol Use: No  . Drug Use: No  . Sexual Activity: No     Comment: TAH--Poss. left ovary remains   Other Topics Concern  . Not on file   Social History Narrative    ROS:  Pertinent items are noted in HPI.  PHYSICAL EXAMINATION:    BP 138/82 mmHg  Pulse 72  Wt 128 lb 12.8 oz (58.423 kg)    General appearance: alert, cooperative and appears stated age   Pelvic: External genitalia:   Hypopigmentation of the labia minora superiorly and right side.  No ulceration.               Urethra:  normal appearing urethra with no masses, tenderness or lesions              Bartholins and Skenes: normal                 Vagina: normal appearing vagina with normal color and discharge, no lesions              Cervix: absent              Bimanual Exam:  Uterus:  uterus absent              Adnexa: no mass, fullness, tenderness              Chaperone was present for exam.  ASSESSMENT  Lichen sclerosus - improved with clobetasol.  Menopausal symptoms controlled on Paxil.  Overactive bladder syndrome.  Desire for less expensive medication.   PLAN  Counseled regarding lichen sclerosus.  Decrease use of clobetasol to once daily.  Counseled regarding menopausal symptoms and options for care - estrogen, SSRI/SNRIs, herbal treatment.  New Rx for Paxil 10 mg to patient.  Counseled regarding medical therapy for overactive  bladder.  Will stop Detrol and start Ditropan 2.5 mg po bid.  See Rx.  Follow up in 6 weeks for a recheck.   An After Visit Summary was printed and given to the patient.  __25___ minutes face to face time of which over 50% was spent in counseling.

## 2015-06-15 ENCOUNTER — Encounter: Payer: Self-pay | Admitting: Obstetrics and Gynecology

## 2015-06-15 ENCOUNTER — Ambulatory Visit (INDEPENDENT_AMBULATORY_CARE_PROVIDER_SITE_OTHER): Payer: Commercial Managed Care - HMO | Admitting: Obstetrics and Gynecology

## 2015-06-15 VITALS — BP 130/72 | HR 70 | Resp 14 | Wt 130.0 lb

## 2015-06-15 DIAGNOSIS — L900 Lichen sclerosus et atrophicus: Secondary | ICD-10-CM

## 2015-06-15 NOTE — Progress Notes (Signed)
GYNECOLOGY  VISIT   HPI: 81 y.o.   Married  Caucasian  female   [phone] with No LMP recorded. Patient has had a hysterectomy.   here for  6 weeks recheck for lichen sclerosus. Feeling much better.  Using the Clobetasol but forgets to use it now.  Maybe uses a couple of times a week.   Doristine Devoid grand-daughter is present for the visit and waits behind the curtain of the exam room.   GYNECOLOGIC HISTORY: No LMP recorded. Patient has had a hysterectomy. Contraception: Hysterectomy Menopausal hormone therapy: Premarin Vaginal Cream Last mammogram: 06/24/14 Bi-rads C 1 neg Last pap smear: 04/25/13 WNL        OB History    Gravida Para Term Preterm AB TAB SAB Ectopic Multiple Living   3 2 2  1  1   1       Obstetric Comments   Daughter died at age 43 defective heart valve         Patient Active Problem List   Diagnosis Date Noted  . Hematoma of leg 07/20/2014  . Hearing loss 05/09/2014  . Neck pain, chronic 05/09/2014  . Medicare annual wellness visit, subsequent 05/09/2014  . Headache(784.0) 11/02/2013  . Atrophic vaginitis 10/03/2013  . Vaginal discomfort 08/10/2013  . Knee pain, bilateral 02/02/2013  . Injury of adductor muscle and tendon of thigh 01/01/2013  . Pes anserine bursitis 01/01/2013  . Ejection fraction   . SVT (supraventricular tachycardia)   . Sinus tachycardia   . Normal cardiac stress test   . Grief reaction 03/10/2012  . Sinusitis 12/16/2011  . HTN (hypertension) 03/06/2011  . Postmenopausal status 03/06/2011  . Lesion of external ear 03/06/2011  . DIVERTICULOSIS, COLON 01/28/2011  . CONSTIPATION 01/28/2011  . MEASLES, HX OF 01/28/2011  . CHICKENPOX, HX OF 01/28/2011  . GERD 12/28/2007  . Hyperlipidemia 12/17/2007  . ANEMIA, CHRONIC 08/13/2006  . OSTEOPOROSIS 08/13/2006    Past Medical History  Diagnosis Date  . SVT (supraventricular tachycardia) 2007    3-4 beats.. Holter  . Sinus tachycardia     mild-- retesting  . Fatigue 1/11    w/  exercise.. responded to increased beta blocker dose  . Dyslipidemia   . Anemia     iron deficiency  . GERD (gastroesophageal reflux disease)   . Osteoporosis   . Other specified cardiac dysrhythmias(427.89) 09/17/2010  . OSTEOPOROSIS 08/13/2006  . MEASLES, HX OF 01/28/2011  . HYPERLIPIDEMIA 12/17/2007  . GERD 12/28/2007  . HTN (hypertension) 03/06/2011  . DIVERTICULOSIS, COLON 01/28/2011  . CONSTIPATION 01/28/2011  . CHICKENPOX, HX OF 01/28/2011  . ANEMIA, CHRONIC 08/13/2006  . Post menopausal problems 03/06/2011  . Lesion of external ear 03/06/2011  . Sinusitis 12/16/2011  . Hypercholesteremia 03/10/2012  . Grief reaction 03/10/2012  . Ejection fraction     EF 55%, echo, 2007  . Normal cardiac stress test     Nuclear, 2004, normal  . Arthritis     left knee  . Atrophic vaginitis 10/03/2013  . Sinusitis, acute 11/02/2013  . Medicare annual wellness visit, subsequent 05/09/2014  . Endometriosis     Past Surgical History  Procedure Laterality Date  . Abdominal hysterectomy      w/ unilateral oophorectomy-bleeding, ovarian cysts, left ovary left in place?  . Right knee arthroscopy, cleaned    . Breast surgery      Implants--Silicone    Current Outpatient Prescriptions  Medication Sig Dispense Refill  . ALPRAZolam (XANAX) 0.5 MG tablet Take 1 tablet (0.5 mg  total) by mouth as needed for sleep or anxiety. 1/2 to 2 tabs po bid prn for anxiety, insomnia 90 tablet 0  . aspirin 81 MG tablet Take 81 mg by mouth daily.      Marland Kitchen BIOTIN PO Take 1 tablet by mouth daily.    . Calcium Carbonate-Vitamin D (CALTRATE 600+D) 600-400 MG-UNIT per tablet Take 1 tablet by mouth 2 (two) times daily.      . Cholecalciferol (VITAMIN D) 1000 UNITS capsule Take 1,000 Units by mouth daily.      . clobetasol ointment (TEMOVATE) 1.02 % Apply 1 application topically 2 (two) times daily. Use for one month only. Apply thin layer. 30 g 1  . co-enzyme Q-10 30 MG capsule Take 30 mg by mouth 2 (two) times daily.    Marland Kitchen  conjugated estrogens (PREMARIN) vaginal cream Place 1 Applicatorful vaginally daily. 1/4 gram per vagina once weekly.    Marland Kitchen diltiazem (CARDIZEM) 120 MG tablet TAKE 1 TABLET EVERY DAY 90 tablet 3  . diphenhydramine-acetaminophen (TYLENOL PM) 25-500 MG TABS Take 1 tablet by mouth at bedtime as needed.    . fish oil-omega-3 fatty acids 1000 MG capsule Take 2 g by mouth 3 (three) times daily.      . Lutein 6 MG CAPS Take 1 capsule by mouth daily.      . metoprolol succinate (TOPROL-XL) 50 MG 24 hr tablet Take 1 tablet (50 mg total) by mouth daily. 90 tablet 3  . oxybutynin (DITROPAN) 5 MG tablet Take 0.5 tablets (2.5 mg total) by mouth 2 (two) times daily. 60 tablet 2  . PARoxetine (PAXIL) 10 MG tablet Take 1 tablet (10 mg total) by mouth daily. 90 tablet 2  . polyethylene glycol powder (MIRALAX) powder Take 17 g by mouth daily as needed. 255 g 0  . Probiotic Product (PEARLS IC PO) Take by mouth.    . simvastatin (ZOCOR) 10 MG tablet TAKE 1 TABLET (10 MG TOTAL) BY MOUTH DAILY AT 6 PM. 90 tablet 1   No current facility-administered medications for this visit.     ALLERGIES: Review of patient's allergies indicates no known allergies.  Family History  Problem Relation Age of Onset  . Stroke Mother   . Hypertension Mother   . Other Mother     CHF  . Transient ischemic attack Mother   . Cancer Mother 44    cervical  . Cancer Father     stomach, smoker  . Cancer Sister     brain  . Lymphoma Brother   . Cancer Brother   . Stroke Daughter     ? stroke  . Mitral valve prolapse Daughter   . Irritable bowel syndrome Daughter   . Migraines Daughter   . Hypertension Son   . Cancer Paternal Aunt     cancer  . Cancer Paternal Uncle     X 2 CA  . Heart attack Paternal Uncle   . Other Maternal Grandmother     CHF  . Alcohol abuse Brother   . Heart attack Brother     smoker    History   Social History  . Marital Status: Married    Spouse Name: N/A  . Number of Children: N/A  . Years  of Education: N/A   Occupational History  . Not on file.   Social History Main Topics  . Smoking status: Never Smoker   . Smokeless tobacco: Never Used  . Alcohol Use: No  . Drug Use: No  .  Sexual Activity: No     Comment: TAH--Poss. left ovary remains   Other Topics Concern  . Not on file   Social History Narrative    ROS:  Pertinent items are noted in HPI.  PHYSICAL EXAMINATION:    There were no vitals taken for this visit.    General appearance: alert, cooperative and appears stated age     Pelvic: External genitalia:  Pink coloration to the mucosa. Increased labial folds.              Urethra:  normal appearing urethra with no masses, tenderness or lesions              Bartholins and Skenes: normal                 Vagina: normal appearing vagina with normal color and discharge, no lesions              Cervix: absent              Bimanual Exam:  Uterus:  uterus absent              Adnexa: no mass, fullness, tenderness            Chaperone was present for exam.  ASSESSMENT  Lichen sclerosus.  Improved with Clobetasol ointment 0.05%.  PLAN  Counseled regarding lichen sclerosus.  Will reduce the use of the cream to twice weekly as maintenance dosing.  Follow up for annual exam and prn.    An After Visit Summary was printed and given to the patient.  _15_____ minutes face to face time of which over 50% was spent in counseling.

## 2015-06-16 ENCOUNTER — Encounter: Payer: Self-pay | Admitting: Obstetrics and Gynecology

## 2015-07-25 ENCOUNTER — Ambulatory Visit (INDEPENDENT_AMBULATORY_CARE_PROVIDER_SITE_OTHER): Payer: Commercial Managed Care - HMO

## 2015-07-25 DIAGNOSIS — Z23 Encounter for immunization: Secondary | ICD-10-CM | POA: Diagnosis not present

## 2015-08-09 DIAGNOSIS — Z1231 Encounter for screening mammogram for malignant neoplasm of breast: Secondary | ICD-10-CM | POA: Diagnosis not present

## 2015-08-09 LAB — HM MAMMOGRAPHY

## 2015-08-21 ENCOUNTER — Encounter: Payer: Self-pay | Admitting: Family Medicine

## 2015-09-20 ENCOUNTER — Other Ambulatory Visit: Payer: Self-pay | Admitting: Family Medicine

## 2015-10-20 ENCOUNTER — Encounter: Payer: Self-pay | Admitting: Cardiology

## 2015-10-26 ENCOUNTER — Telehealth: Payer: Self-pay | Admitting: Family Medicine

## 2015-10-26 NOTE — Telephone Encounter (Signed)
Caller name: Self   Can be reached: [phone]  Pharmacy: [address] [place], [place] [address]  [phone]   Reason for call: Request refill on ALPRAZolam Duanne Moron) 0.5 MG tablet [id number]

## 2015-10-26 NOTE — Telephone Encounter (Signed)
Pt is requesting refill on Alprazolam.  Last OV: 09/06/2014, appt scheduled 12/12/2015 for CPE Last Fill: 09/06/2014 #90 and 0RF UDS: None  Needs UDS and contract at next OV.  Please advise.

## 2015-10-27 MED ORDER — ALPRAZOLAM 0.5 MG PO TABS: 0.5000 mg | ORAL_TABLET | ORAL | Status: AC | PRN

## 2015-10-27 NOTE — Telephone Encounter (Signed)
OK to refill alprazolam 30 day supply

## 2015-10-27 NOTE — Telephone Encounter (Signed)
Printed as instructed and on counter for signature 

## 2015-10-27 NOTE — Telephone Encounter (Signed)
Informed the patient faxed hardcopy to [facility]

## 2015-10-31 ENCOUNTER — Emergency Department (HOSPITAL_COMMUNITY)
Admission: EM | Admit: 2015-10-31 | Discharge: 2015-10-31 | Disposition: A | Discharge status: Home / Self Care | Payer: Commercial Managed Care - HMO | Attending: Emergency Medicine | Admitting: Emergency Medicine

## 2015-10-31 ENCOUNTER — Encounter (HOSPITAL_COMMUNITY): Payer: Self-pay | Admitting: *Deleted

## 2015-10-31 DIAGNOSIS — Z8669 Personal history of other diseases of the nervous system and sense organs: Secondary | ICD-10-CM | POA: Diagnosis not present

## 2015-10-31 DIAGNOSIS — Z90710 Acquired absence of both cervix and uterus: Secondary | ICD-10-CM | POA: Insufficient documentation

## 2015-10-31 DIAGNOSIS — Z8619 Personal history of other infectious and parasitic diseases: Secondary | ICD-10-CM | POA: Insufficient documentation

## 2015-10-31 DIAGNOSIS — Z7982 Long term (current) use of aspirin: Secondary | ICD-10-CM | POA: Diagnosis not present

## 2015-10-31 DIAGNOSIS — Z7952 Long term (current) use of systemic steroids: Secondary | ICD-10-CM | POA: Diagnosis not present

## 2015-10-31 DIAGNOSIS — M1990 Unspecified osteoarthritis, unspecified site: Secondary | ICD-10-CM | POA: Insufficient documentation

## 2015-10-31 DIAGNOSIS — I10 Essential (primary) hypertension: Secondary | ICD-10-CM | POA: Insufficient documentation

## 2015-10-31 DIAGNOSIS — Z79899 Other long term (current) drug therapy: Secondary | ICD-10-CM | POA: Insufficient documentation

## 2015-10-31 DIAGNOSIS — R112 Nausea with vomiting, unspecified: Secondary | ICD-10-CM

## 2015-10-31 DIAGNOSIS — Z862 Personal history of diseases of the blood and blood-forming organs and certain disorders involving the immune mechanism: Secondary | ICD-10-CM | POA: Insufficient documentation

## 2015-10-31 DIAGNOSIS — Z8659 Personal history of other mental and behavioral disorders: Secondary | ICD-10-CM | POA: Diagnosis not present

## 2015-10-31 DIAGNOSIS — R531 Weakness: Secondary | ICD-10-CM | POA: Diagnosis not present

## 2015-10-31 DIAGNOSIS — M810 Age-related osteoporosis without current pathological fracture: Secondary | ICD-10-CM | POA: Diagnosis not present

## 2015-10-31 DIAGNOSIS — E7800 Pure hypercholesterolemia, unspecified: Secondary | ICD-10-CM | POA: Insufficient documentation

## 2015-10-31 DIAGNOSIS — E785 Hyperlipidemia, unspecified: Secondary | ICD-10-CM | POA: Diagnosis not present

## 2015-10-31 DIAGNOSIS — N390 Urinary tract infection, site not specified: Secondary | ICD-10-CM | POA: Insufficient documentation

## 2015-10-31 DIAGNOSIS — K219 Gastro-esophageal reflux disease without esophagitis: Secondary | ICD-10-CM | POA: Diagnosis not present

## 2015-10-31 DIAGNOSIS — M6281 Muscle weakness (generalized): Secondary | ICD-10-CM | POA: Diagnosis not present

## 2015-10-31 LAB — COMPREHENSIVE METABOLIC PANEL
ALBUMIN: 4.2 g/dL (ref 3.5–5.0)
ALK PHOS: 71 U/L (ref 38–126)
ALT: 24 U/L (ref 14–54)
ANION GAP: 9 (ref 5–15)
AST: 26 U/L (ref 15–41)
BILIRUBIN TOTAL: 0.7 mg/dL (ref 0.3–1.2)
BUN: 19 mg/dL (ref 6–20)
CALCIUM: 9.6 mg/dL (ref 8.9–10.3)
CO2: 29 mmol/L (ref 22–32)
CREATININE: 0.83 mg/dL (ref 0.44–1.00)
Chloride: 102 mmol/L (ref 101–111)
GFR calc non Af Amer: >60 mL/min (ref >60)
GLUCOSE: 125 mg/dL — AB (ref 65–99)
Potassium: 3.5 mmol/L (ref 3.5–5.1)
SODIUM: 140 mmol/L (ref 135–145)
TOTAL PROTEIN: 8 g/dL (ref 6.5–8.1)

## 2015-10-31 LAB — URINE MICROSCOPIC-ADD ON

## 2015-10-31 LAB — CBG MONITORING, ED: Glucose-Capillary: 112 mg/dL — ABNORMAL HIGH (ref 65–99)

## 2015-10-31 LAB — CBC
HCT: 39.3 % (ref 36.0–46.0)
HEMOGLOBIN: 12.3 g/dL (ref 12.0–15.0)
MCH: 28.4 pg (ref 26.0–34.0)
MCHC: 31.3 g/dL (ref 30.0–36.0)
MCV: 90.8 fL (ref 78.0–100.0)
PLATELETS: 215 10*3/uL (ref 150–400)
RBC: 4.33 MIL/uL (ref 3.87–5.11)
RDW: 12.8 % (ref 11.5–15.5)
WBC: 12.8 10*3/uL — ABNORMAL HIGH (ref 4.0–10.5)

## 2015-10-31 LAB — URINALYSIS, ROUTINE W REFLEX MICROSCOPIC
BILIRUBIN URINE: NEGATIVE
Glucose, UA: NEGATIVE mg/dL
HGB URINE DIPSTICK: NEGATIVE
KETONES UR: NEGATIVE mg/dL
NITRITE: NEGATIVE
PROTEIN: NEGATIVE mg/dL
Specific Gravity, Urine: 1.02 (ref 1.005–1.030)
pH: 7.5 (ref 5.0–8.0)

## 2015-10-31 LAB — LIPASE, BLOOD: Lipase: 26 U/L (ref 11–51)

## 2015-10-31 MED ORDER — ONDANSETRON HCL 4 MG PO TABS: 4.0000 mg | ORAL_TABLET | Freq: Four times a day (QID) | ORAL | Status: DC

## 2015-10-31 MED ORDER — CEPHALEXIN 500 MG PO CAPS
1000.0000 mg | ORAL_CAPSULE | Freq: Once | ORAL | Status: AC
  Administered 2015-10-31: 1000 mg via ORAL
  Filled 2015-10-31: qty 2

## 2015-10-31 MED ORDER — CEPHALEXIN 500 MG PO CAPS: 500.0000 mg | ORAL_CAPSULE | Freq: Four times a day (QID) | ORAL | Status: DC

## 2015-10-31 NOTE — ED Notes (Signed)
Pt ambulating independently w/ steady gait on d/c in no acute distress, A&Ox4. D/c instructions reviewed w/ pt and family - pt and family deny any further questions or concerns at present. Rx given x2  

## 2015-10-31 NOTE — ED Notes (Addendum)
Pt complains of vomiting and weakness in her legs since Friday night. Pt denies diarrhea, states she has intermittent abdominal pain. Pt states she has also has a headache.

## 2015-10-31 NOTE — ED Notes (Signed)
Pt w/o any nausea or vomiting.

## 2015-10-31 NOTE — Discharge Instructions (Signed)
Urinary Tract Infection °Urinary tract infections (UTIs) can develop anywhere along your urinary tract. Your urinary tract is your body's drainage system for removing wastes and extra water. Your urinary tract includes two kidneys, two ureters, a bladder, and a urethra. Your kidneys are a pair of bean-shaped organs. Each kidney is about the size of your fist. They are located below your ribs, one on each side of your spine. °CAUSES °Infections are caused by microbes, which are microscopic organisms, including fungi, viruses, and bacteria. These organisms are so small that they can only be seen through a microscope. Bacteria are the microbes that most commonly cause UTIs. °SYMPTOMS  °Symptoms of UTIs may vary by age and gender of the patient and by the location of the infection. Symptoms in young women typically include a frequent and intense urge to urinate and a painful, burning feeling in the bladder or urethra during urination. Older women and men are more likely to be tired, shaky, and weak and have muscle aches and abdominal pain. A fever may mean the infection is in your kidneys. Other symptoms of a kidney infection include pain in your back or sides below the ribs, nausea, and vomiting. °DIAGNOSIS °To diagnose a UTI, your caregiver will ask you about your symptoms. Your caregiver will also ask you to provide a urine sample. The urine sample will be tested for bacteria and white blood cells. White blood cells are made by your body to help fight infection. °TREATMENT  °Typically, UTIs can be treated with medication. Because most UTIs are caused by a bacterial infection, they usually can be treated with the use of antibiotics. The choice of antibiotic and length of treatment depend on your symptoms and the type of bacteria causing your infection. °HOME CARE INSTRUCTIONS °· If you were prescribed antibiotics, take them exactly as your caregiver instructs you. Finish the medication even if you feel better after  you have only taken some of the medication. °· Drink enough water and fluids to keep your urine clear or pale yellow. °· Avoid caffeine, tea, and carbonated beverages. They tend to irritate your bladder. °· Empty your bladder often. Avoid holding urine for long periods of time. °· Empty your bladder before and after sexual intercourse. °· After a bowel movement, women should cleanse from front to back. Use each tissue only once. °SEEK MEDICAL CARE IF:  °· You have back pain. °· You develop a fever. °· Your symptoms do not begin to resolve within 3 days. °SEEK IMMEDIATE MEDICAL CARE IF:  °· You have severe back pain or lower abdominal pain. °· You develop chills. °· You have nausea or vomiting. °· You have continued burning or discomfort with urination. °MAKE SURE YOU:  °· Understand these instructions. °· Will watch your condition. °· Will get help right away if you are not doing well or get worse. °  °This information is not intended to replace advice given to you by your health care provider. Make sure you discuss any questions you have with your health care provider. °  °Document Released: 08/07/2005 Document Revised: 07/19/2015 Document Reviewed: 12/06/2011 °Elsevier Interactive Patient Education ©2016 [employer] Inc[employer] ° °Weakness °Weakness is a lack of strength. It may be felt all over the body (generalized) or in one specific part of the body (focal). Some causes of weakness can be serious. You may need further medical evaluation, especially if you are elderly or you have a history of immunosuppression (such as chemotherapy or HIV), kidney disease, heart disease, or   diabetes. °CAUSES  °Weakness can be caused by many different things, including: °· Infection. °· Physical exhaustion. °· Internal bleeding or other blood loss that results in a lack of red blood cells (anemia). °· Dehydration. This cause is more common in elderly people. °· Side effects or electrolyte abnormalities from medicines, such as pain  medicines or sedatives. °· Emotional distress, anxiety, or depression. °· Circulation problems, especially severe peripheral arterial disease. °· Heart disease, such as rapid atrial fibrillation, bradycardia, or heart failure. °· Nervous system disorders, such as Guillain-Barré syndrome, multiple sclerosis, or stroke. °DIAGNOSIS  °To find the cause of your weakness, your caregiver will take your history and perform a physical exam. Lab tests or X-rays may also be ordered, if needed. °TREATMENT  °Treatment of weakness depends on the cause of your symptoms and can vary greatly. °HOME CARE INSTRUCTIONS  °· Rest as needed. °· Eat a well-balanced diet. °· Try to get some exercise every day. °· Only take over-the-counter or prescription medicines as directed by your caregiver. °SEEK MEDICAL CARE IF:  °· Your weakness seems to be getting worse or spreads to other parts of your body. °· You develop new aches or pains. °SEEK IMMEDIATE MEDICAL CARE IF:  °· You cannot perform your normal daily activities, such as getting dressed and feeding yourself. °· You cannot walk up and down stairs, or you feel exhausted when you do so. °· You have shortness of breath or chest pain. °· You have difficulty moving parts of your body. °· You have weakness in only one area of the body or on only one side of the body. °· You have a fever. °· You have trouble speaking or swallowing. °· You cannot control your bladder or bowel movements. °· You have black or bloody vomit or stools. °MAKE SURE YOU: °· Understand these instructions. °· Will watch your condition. °· Will get help right away if you are not doing well or get worse. °  °This information is not intended to replace advice given to you by your health care provider. Make sure you discuss any questions you have with your health care provider. °  °Document Released: 10/28/2005 Document Revised: 04/28/2012 Document Reviewed: 12/27/2011 °Elsevier Interactive Patient Education ©2016 Elsevier  Inc. ° °

## 2015-10-31 NOTE — ED Provider Notes (Signed)
CSN: [id number]     Arrival date & time 10/31/15  1545 History   First MD Initiated Contact with Patient 10/31/15 1854     Chief Complaint  Patient presents with  . Emesis  . Weakness     (Consider location/radiation/quality/duration/timing/severity/associated sxs/prior Treatment) HPI Patient poor she's had nausea and vomiting that started Friday which was 5 days ago. She had no associated diarrhea. She reports that by Saturday she did not have much vomiting. She reports she has had ongoing nausea however. She has had some diffuse abdominal discomfort. This does not localize. She does report one additional episode of vomiting today. She has not had fever. She reports some diffuse headache. Patient reports at this point she has just felt fatigued and generally weak. Past Medical History  Diagnosis Date  . SVT (supraventricular tachycardia) ([facility]) 2007    3-4 beats.. Holter  . Sinus tachycardia ([facility])     mild-- retesting  . Fatigue 1/11    w/ exercise.. responded to increased beta blocker dose  . Dyslipidemia   . Anemia     iron deficiency  . GERD (gastroesophageal reflux disease)   . Osteoporosis   . Other specified cardiac dysrhythmias(427.89) 09/17/2010  . OSTEOPOROSIS 08/13/2006  . MEASLES, HX OF 01/28/2011  . HYPERLIPIDEMIA 12/17/2007  . GERD 12/28/2007  . HTN (hypertension) 03/06/2011  . DIVERTICULOSIS, COLON 01/28/2011  . CONSTIPATION 01/28/2011  . CHICKENPOX, HX OF 01/28/2011  . ANEMIA, CHRONIC 08/13/2006  . Post menopausal problems 03/06/2011  . Lesion of external ear 03/06/2011  . Sinusitis 12/16/2011  . Hypercholesteremia 03/10/2012  . Grief reaction 03/10/2012  . Ejection fraction     EF 55%, echo, 2007  . Normal cardiac stress test     Nuclear, 2004, normal  . Arthritis     left knee  . Atrophic vaginitis 10/03/2013  . Sinusitis, acute 11/02/2013  . Medicare annual wellness visit, subsequent 05/09/2014  . Endometriosis    Past Surgical History  Procedure Laterality Date   . Abdominal hysterectomy      w/ unilateral oophorectomy-bleeding, ovarian cysts, left ovary left in place?  . Right knee arthroscopy, cleaned    . Breast surgery      Implants--Silicone   Family History  Problem Relation Age of Onset  . Stroke Mother   . Hypertension Mother   . Other Mother     CHF  . Transient ischemic attack Mother   . Cancer Mother 74    cervical  . Cancer Father     stomach, smoker  . Cancer Sister     brain  . Lymphoma Brother   . Cancer Brother   . Stroke Daughter     ? stroke  . Mitral valve prolapse Daughter   . Irritable bowel syndrome Daughter   . Migraines Daughter   . Hypertension Son   . Cancer Paternal Aunt     cancer  . Cancer Paternal Uncle     X 2 CA  . Heart attack Paternal Uncle   . Other Maternal Grandmother     CHF  . Alcohol abuse Brother   . Heart attack Brother     smoker   Social History  Substance Use Topics  . Smoking status: Never Smoker   . Smokeless tobacco: Never Used  . Alcohol Use: No   OB History    Gravida Para Term Preterm AB TAB SAB Ectopic Multiple Living   3 2 2  1  1    1  Obstetric Comments   Daughter died at age 61 defective heart valve     Review of Systems 10 Systems reviewed and are negative for acute change except as noted in the HPI.    Allergies  Review of patient's allergies indicates no known allergies.  Home Medications   Prior to Admission medications   Medication Sig Start Date End Date Taking? Authorizing Provider  ALPRAZolam Duanne Moron) 0.5 MG tablet Take 1 tablet (0.5 mg total) by mouth as needed for sleep or anxiety. 1/2 to 2 tabs po bid prn for anxiety, insomnia 10/27/15  Yes Mosie Lukes, MD  aspirin 81 MG tablet Take 81 mg by mouth daily.     Yes Historical Provider, MD  BIOTIN PO Take 1 tablet by mouth daily.   Yes Historical Provider, MD  Calcium Carbonate-Vitamin D (CALTRATE 600+D) 600-400 MG-UNIT per tablet Take 1 tablet by mouth 2 (two) times daily.     Yes  Historical Provider, MD  Cholecalciferol (VITAMIN D) 1000 UNITS capsule Take 1,000 Units by mouth daily.     Yes Historical Provider, MD  clobetasol ointment (TEMOVATE) [id number] % Apply 1 application topically 2 (two) times daily. Use for one month only. Apply thin layer. 04/06/15  Yes [place], MD  co-enzyme Q-10 30 MG capsule Take 30 mg by mouth at bedtime.    Yes Historical Provider, MD  conjugated estrogens (PREMARIN) vaginal cream Place 1 Applicatorful vaginally. 1/4 gram per vagina once weekly.   Yes Historical Provider, MD  diltiazem (CARDIZEM) 120 MG tablet TAKE 1 TABLET EVERY DAY 08/31/14  Yes Carlena Bjornstad, MD  diphenhydramine-acetaminophen (TYLENOL PM) 25-500 MG TABS Take 1 tablet by mouth at bedtime as needed (pain/sleep.).    Yes Historical Provider, MD  fish oil-omega-3 fatty acids 1000 MG capsule Take 1 g by mouth daily.    Yes Historical Provider, MD  Lutein 6 MG CAPS Take 1 capsule by mouth daily.     Yes Historical Provider, MD  metoprolol succinate (TOPROL-XL) 50 MG 24 hr tablet Take 1 tablet (50 mg total) by mouth daily. 01/27/15  Yes Carlena Bjornstad, MD  oxybutynin (DITROPAN) 5 MG tablet Take 0.5 tablets (2.5 mg total) by mouth 2 (two) times daily. 05/04/15  Yes Brook E Yisroel Ramming, MD  PARoxetine (PAXIL) 10 MG tablet Take 1 tablet (10 mg total) by mouth daily. 05/04/15  Yes Brook E Yisroel Ramming, MD  polyethylene glycol powder (MIRALAX) powder Take 17 g by mouth daily as needed. 12/16/11  Yes Mosie Lukes, MD  simvastatin (ZOCOR) 10 MG tablet TAKE 1 TABLET (10 MG TOTAL) BY MOUTH DAILY AT 6 PM. 09/20/15  Yes Mosie Lukes, MD  cephALEXin (KEFLEX) 500 MG capsule Take 1 capsule (500 mg total) by mouth 4 (four) times daily. 10/31/15   Charlesetta Shanks, MD  ondansetron (ZOFRAN) 4 MG tablet Take 1 tablet (4 mg total) by mouth every 6 (six) hours. 10/31/15   Charlesetta Shanks, MD   BP 119/69 mmHg  Pulse 74  Temp(Src) 98.6 F (37 C) (Oral)  Resp 16  SpO2 95% Physical  Exam  Constitutional: She is oriented to person, place, and time. She appears well-developed and well-nourished.  HENT:  Head: Normocephalic and atraumatic.  Eyes: EOM are normal. Pupils are equal, round, and reactive to light.  Neck: Neck supple.  Cardiovascular: Normal rate, regular rhythm, normal heart sounds and intact distal pulses.   Pulmonary/Chest: Effort normal and breath sounds normal.  Abdominal: Soft.  Bowel sounds are normal. She exhibits no distension. There is no tenderness.  Musculoskeletal: Normal range of motion. She exhibits no edema or tenderness.  Neurological: She is alert and oriented to person, place, and time. She has normal strength. Coordination normal. GCS eye subscore is 4. GCS verbal subscore is 5. GCS motor subscore is 6.  Skin: Skin is warm, dry and intact.  Psychiatric: She has a normal mood and affect.    ED Course  Procedures (including critical care time) Labs Review Labs Reviewed  COMPREHENSIVE METABOLIC PANEL - Abnormal; Notable for the following:    Glucose, Bld 125 (*)    All other components within normal limits  CBC - Abnormal; Notable for the following:    WBC 12.8 (*)    All other components within normal limits  URINALYSIS, ROUTINE W REFLEX MICROSCOPIC (NOT AT [facility]) - Abnormal; Notable for the following:    APPearance CLOUDY (*)    Leukocytes, UA SMALL (*)    All other components within normal limits  URINE MICROSCOPIC-ADD ON - Abnormal; Notable for the following:    Squamous Epithelial / LPF 6-30 (*)    Bacteria, UA MANY (*)    All other components within normal limits  CBG MONITORING, ED - Abnormal; Notable for the following:    Glucose-Capillary 112 (*)    All other components within normal limits  URINE CULTURE  LIPASE, BLOOD    Imaging Review No results found. I have personally reviewed and evaluated these images and lab results as part of my medical decision-making.   EKG Interpretation None      MDM   Final  diagnoses:  UTI (lower urinary tract infection)  Non-intractable vomiting with nausea, vomiting of unspecified type  Generalized weakness   Patient's general appearance is well. She is alert and nontoxic. Patient has a soft abdominal examination without any localizing tenderness. Vital signs are stable. Clinically and by diagnostic study the patient does not show dehydration. Urinalysis does have leukoesterase and white cells present. I feel the differential for the patient's symptoms was possible viral GI symptoms and/or UTI. With the patient's clinically well appearance and I felt she was stable for a trial of outpatient antibiotics for UTI with urine culture pending. Patient and her companion have been given instructions for signs and symptoms for which to return.    Charlesetta Shanks, MD 10/31/15 [phone]

## 2015-11-03 LAB — URINE CULTURE

## 2015-12-02 ENCOUNTER — Other Ambulatory Visit: Payer: Self-pay | Admitting: Obstetrics and Gynecology

## 2015-12-04 NOTE — Telephone Encounter (Signed)
Medication refill request: Ditropan/ Paxil Last AEX:  Never Last Visit: 06-15-15 Next AEX: 03-29-16 Last MMG (if hormonal medication request): 07-30-15 WNL Refill authorized: please advise

## 2015-12-11 ENCOUNTER — Telehealth: Payer: Self-pay | Admitting: *Deleted

## 2015-12-11 NOTE — Telephone Encounter (Signed)
Unable to reach patient at time of pre-visit call. Left message for patient to return call when available.  

## 2015-12-12 ENCOUNTER — Ambulatory Visit (INDEPENDENT_AMBULATORY_CARE_PROVIDER_SITE_OTHER): Payer: Commercial Managed Care - HMO | Admitting: Family Medicine

## 2015-12-12 ENCOUNTER — Encounter: Payer: Self-pay | Admitting: Family Medicine

## 2015-12-12 ENCOUNTER — Ambulatory Visit (HOSPITAL_BASED_OUTPATIENT_CLINIC_OR_DEPARTMENT_OTHER)
Admission: RE | Admit: 2015-12-12 | Discharge: 2015-12-12 | Disposition: A | Discharge status: Home / Self Care | Payer: Commercial Managed Care - HMO | Source: Ambulatory Visit | Attending: Family Medicine | Admitting: Family Medicine

## 2015-12-12 VITALS — BP 120/62 | HR 61 | Temp 97.7°F | Ht 63.0 in | Wt 130.5 lb

## 2015-12-12 DIAGNOSIS — I471 Supraventricular tachycardia, unspecified: Secondary | ICD-10-CM

## 2015-12-12 DIAGNOSIS — E785 Hyperlipidemia, unspecified: Secondary | ICD-10-CM | POA: Diagnosis not present

## 2015-12-12 DIAGNOSIS — J984 Other disorders of lung: Secondary | ICD-10-CM | POA: Diagnosis not present

## 2015-12-12 DIAGNOSIS — I517 Cardiomegaly: Secondary | ICD-10-CM | POA: Diagnosis not present

## 2015-12-12 DIAGNOSIS — D72829 Elevated white blood cell count, unspecified: Secondary | ICD-10-CM | POA: Diagnosis not present

## 2015-12-12 DIAGNOSIS — R12 Heartburn: Secondary | ICD-10-CM | POA: Insufficient documentation

## 2015-12-12 DIAGNOSIS — N390 Urinary tract infection, site not specified: Secondary | ICD-10-CM | POA: Diagnosis not present

## 2015-12-12 DIAGNOSIS — M545 Low back pain: Secondary | ICD-10-CM | POA: Insufficient documentation

## 2015-12-12 DIAGNOSIS — Z0001 Encounter for general adult medical examination with abnormal findings: Secondary | ICD-10-CM

## 2015-12-12 DIAGNOSIS — K219 Gastro-esophageal reflux disease without esophagitis: Secondary | ICD-10-CM | POA: Diagnosis not present

## 2015-12-12 DIAGNOSIS — K5900 Constipation, unspecified: Secondary | ICD-10-CM | POA: Insufficient documentation

## 2015-12-12 DIAGNOSIS — I10 Essential (primary) hypertension: Secondary | ICD-10-CM

## 2015-12-12 DIAGNOSIS — Z780 Asymptomatic menopausal state: Secondary | ICD-10-CM

## 2015-12-12 DIAGNOSIS — M542 Cervicalgia: Secondary | ICD-10-CM

## 2015-12-12 DIAGNOSIS — N3281 Overactive bladder: Secondary | ICD-10-CM

## 2015-12-12 DIAGNOSIS — G8929 Other chronic pain: Secondary | ICD-10-CM

## 2015-12-12 DIAGNOSIS — Z0000 Encounter for general adult medical examination without abnormal findings: Secondary | ICD-10-CM

## 2015-12-12 MED ORDER — RANITIDINE HCL 150 MG PO TABS: 150.0000 mg | ORAL_TABLET | Freq: Every evening | ORAL | Status: DC | PRN

## 2015-12-12 NOTE — Assessment & Plan Note (Signed)
Follows with Dr Rolly Pancake for this new diagnosis, using infrequent steroid cream with good results

## 2015-12-12 NOTE — Patient Instructions (Addendum)
Take benefiber twice daily and Probiotic Preventive Care for Adults, Female  A healthy lifestyle and preventive care can promote health and wellness. Preventive health guidelines for women include the following key practices.  A routine yearly physical is a good way to check with your health care provider about your health and preventive screening. It is a chance to share any concerns and updates on your health and to receive a thorough exam.  Visit your dentist for a routine exam and preventive care every 6 months. Brush your teeth twice a day and floss once a day. Good oral hygiene prevents tooth decay and gum disease.  The frequency of eye exams is based on your age, health, family medical history, use of contact lenses, and other factors. Follow your health care provider's recommendations for frequency of eye exams.  Eat a healthy diet. Foods like vegetables, fruits, whole grains, low-fat dairy products, and lean protein foods contain the nutrients you need without too many calories. Decrease your intake of foods high in solid fats, added sugars, and salt. Eat the right amount of calories for you.Get information about a proper diet from your health care provider, if necessary.  Regular physical exercise is one of the most important things you can do for your health. Most adults should get at least 150 minutes of moderate-intensity exercise (any activity that increases your heart rate and causes you to sweat) each week. In addition, most adults need muscle-strengthening exercises on 2 or more days a week.  Maintain a healthy weight. The body mass index (BMI) is a screening tool to identify possible weight problems. It provides an estimate of body fat based on height and weight. Your health care provider can find your BMI and can help you achieve or maintain a healthy weight.For adults 20 years and older:  A BMI below 18.5 is considered underweight.  A BMI of 18.5 to 24.9 is normal.  A BMI  of 25 to 29.9 is considered overweight.  A BMI of 30 and above is considered obese.  Maintain normal blood lipids and cholesterol levels by exercising and minimizing your intake of saturated fat. Eat a balanced diet with plenty of fruit and vegetables. Blood tests for lipids and cholesterol should begin at age 34 and be repeated every 5 years. If your lipid or cholesterol levels are high, you are over 50, or you are at high risk for heart disease, you may need your cholesterol levels checked more frequently.Ongoing high lipid and cholesterol levels should be treated with medicines if diet and exercise are not working.  If you smoke, find out from your health care provider how to quit. If you do not use tobacco, do not start.  Lung cancer screening is recommended for adults aged 2-80 years who are at high risk for developing lung cancer because of a history of smoking. A yearly low-dose CT scan of the lungs is recommended for people who have at least a 30-pack-year history of smoking and are a current smoker or have quit within the past 15 years. A pack year of smoking is smoking an average of 1 pack of cigarettes a day for 1 year (for example: 1 pack a day for 30 years or 2 packs a day for 15 years). Yearly screening should continue until the smoker has stopped smoking for at least 15 years. Yearly screening should be stopped for people who develop a health problem that would prevent them from having lung cancer treatment.  If you are pregnant,  do not drink alcohol. If you are breastfeeding, be very cautious about drinking alcohol. If you are not pregnant and choose to drink alcohol, do not have more than 1 drink per day. One drink is considered to be 12 ounces (355 mL) of beer, 5 ounces (148 mL) of wine, or 1.5 ounces (44 mL) of liquor.  Avoid use of street drugs. Do not share needles with anyone. Ask for help if you need support or instructions about stopping the use of drugs.  High blood pressure  causes heart disease and increases the risk of stroke. Your blood pressure should be checked at least every 1 to 2 years. Ongoing high blood pressure should be treated with medicines if weight loss and exercise do not work.  If you are 68-61 years old, ask your health care provider if you should take aspirin to prevent strokes.  Diabetes screening is done by taking a blood sample to check your blood glucose level after you have not eaten for a certain period of time (fasting). If you are not overweight and you do not have risk factors for diabetes, you should be screened once every 3 years starting at age 36. If you are overweight or obese and you are 17-13 years of age, you should be screened for diabetes every year as part of your cardiovascular risk assessment.  Breast cancer screening is essential preventive care for women. You should practice "breast self-awareness." This means understanding the normal appearance and feel of your breasts and may include breast self-examination. Any changes detected, no matter how small, should be reported to a health care provider. Women in their 8s and 30s should have a clinical breast exam (CBE) by a health care provider as part of a regular health exam every 1 to 3 years. After age 15, women should have a CBE every year. Starting at age 87, women should consider having a mammogram (breast X-ray test) every year. Women who have a family history of breast cancer should talk to their health care provider about genetic screening. Women at a high risk of breast cancer should talk to their health care providers about having an MRI and a mammogram every year.  Breast cancer gene (BRCA)-related cancer risk assessment is recommended for women who have family members with BRCA-related cancers. BRCA-related cancers include breast, ovarian, tubal, and peritoneal cancers. Having family members with these cancers may be associated with an increased risk for harmful changes  (mutations) in the breast cancer genes BRCA1 and BRCA2. Results of the assessment will determine the need for genetic counseling and BRCA1 and BRCA2 testing.  Your health care provider may recommend that you be screened regularly for cancer of the pelvic organs (ovaries, uterus, and vagina). This screening involves a pelvic examination, including checking for microscopic changes to the surface of your cervix (Pap test). You may be encouraged to have this screening done every 3 years, beginning at age 62.  For women ages 25-65, health care providers may recommend pelvic exams and Pap testing every 3 years, or they may recommend the Pap and pelvic exam, combined with testing for human papilloma virus (HPV), every 5 years. Some types of HPV increase your risk of cervical cancer. Testing for HPV may also be done on women of any age with unclear Pap test results.  Other health care providers may not recommend any screening for nonpregnant women who are considered low risk for pelvic cancer and who do not have symptoms. Ask your health care provider if  a screening pelvic exam is right for you.  If you have had past treatment for cervical cancer or a condition that could lead to cancer, you need Pap tests and screening for cancer for at least 20 years after your treatment. If Pap tests have been discontinued, your risk factors (such as having a new sexual partner) need to be reassessed to determine if screening should resume. Some women have medical problems that increase the chance of getting cervical cancer. In these cases, your health care provider may recommend more frequent screening and Pap tests.  Colorectal cancer can be detected and often prevented. Most routine colorectal cancer screening begins at the age of 66 years and continues through age 88 years. However, your health care provider may recommend screening at an earlier age if you have risk factors for colon cancer. On a yearly basis, your health  care provider may provide home test kits to check for hidden blood in the stool. Use of a small camera at the end of a tube, to directly examine the colon (sigmoidoscopy or colonoscopy), can detect the earliest forms of colorectal cancer. Talk to your health care provider about this at age 28, when routine screening begins. Direct exam of the colon should be repeated every 5-10 years through age 59 years, unless early forms of precancerous polyps or small growths are found.  People who are at an increased risk for hepatitis B should be screened for this virus. You are considered at high risk for hepatitis B if:  You were born in a country where hepatitis B occurs often. Talk with your health care provider about which countries are considered high risk.  Your parents were born in a high-risk country and you have not received a shot to protect against hepatitis B (hepatitis B vaccine).  You have HIV or AIDS.  You use needles to inject street drugs.  You live with, or have sex with, someone who has hepatitis B.  You get hemodialysis treatment.  You take certain medicines for conditions like cancer, organ transplantation, and autoimmune conditions.  Hepatitis C blood testing is recommended for all people born from [birth year] through 1965 and any individual with known risks for hepatitis C.  Practice safe sex. Use condoms and avoid high-risk sexual practices to reduce the spread of sexually transmitted infections (STIs). STIs include gonorrhea, chlamydia, syphilis, trichomonas, herpes, HPV, and human immunodeficiency virus (HIV). Herpes, HIV, and HPV are viral illnesses that have no cure. They can result in disability, cancer, and death.  You should be screened for sexually transmitted illnesses (STIs) including gonorrhea and chlamydia if:  You are sexually active and are younger than 24 years.  You are older than 24 years and your health care provider tells you that you are at risk for this type of  infection.  Your sexual activity has changed since you were last screened and you are at an increased risk for chlamydia or gonorrhea. Ask your health care provider if you are at risk.  If you are at risk of being infected with HIV, it is recommended that you take a prescription medicine daily to prevent HIV infection. This is called preexposure prophylaxis (PrEP). You are considered at risk if:  You are sexually active and do not regularly use condoms or know the HIV status of your partner(s).  You take drugs by injection.  You are sexually active with a partner who has HIV.  Talk with your health care provider about whether you are at high  risk of being infected with HIV. If you choose to begin PrEP, you should first be tested for HIV. You should then be tested every 3 months for as long as you are taking PrEP.  Osteoporosis is a disease in which the bones lose minerals and strength with aging. This can result in serious bone fractures or breaks. The risk of osteoporosis can be identified using a bone density scan. Women ages 65 years and over and women at risk for fractures or osteoporosis should discuss screening with their health care providers. Ask your health care provider whether you should take a calcium supplement or vitamin D to reduce the rate of osteoporosis.  Menopause can be associated with physical symptoms and risks. Hormone replacement therapy is available to decrease symptoms and risks. You should talk to your health care provider about whether hormone replacement therapy is right for you.  Use sunscreen. Apply sunscreen liberally and repeatedly throughout the day. You should seek shade when your shadow is shorter than you. Protect yourself by wearing long sleeves, pants, a wide-brimmed hat, and sunglasses year round, whenever you are outdoors.  Once a month, do a whole body skin exam, using a mirror to look at the skin on your back. Tell your health care provider of new moles,  moles that have irregular borders, moles that are larger than a pencil eraser, or moles that have changed in shape or color.  Stay current with required vaccines (immunizations).  Influenza vaccine. All adults should be immunized every year.  Tetanus, diphtheria, and acellular pertussis (Td, Tdap) vaccine. Pregnant women should receive 1 dose of Tdap vaccine during each pregnancy. The dose should be obtained regardless of the length of time since the last dose. Immunization is preferred during the 27th-36th week of gestation. An adult who has not previously received Tdap or who does not know her vaccine status should receive 1 dose of Tdap. This initial dose should be followed by tetanus and diphtheria toxoids (Td) booster doses every 10 years. Adults with an unknown or incomplete history of completing a 3-dose immunization series with Td-containing vaccines should begin or complete a primary immunization series including a Tdap dose. Adults should receive a Td booster every 10 years.  Varicella vaccine. An adult without evidence of immunity to varicella should receive 2 doses or a second dose if she has previously received 1 dose. Pregnant females who do not have evidence of immunity should receive the first dose after pregnancy. This first dose should be obtained before leaving the health care facility. The second dose should be obtained 4-8 weeks after the first dose.  Human papillomavirus (HPV) vaccine. Females aged 13-26 years who have not received the vaccine previously should obtain the 3-dose series. The vaccine is not recommended for use in pregnant females. However, pregnancy testing is not needed before receiving a dose. If a female is found to be pregnant after receiving a dose, no treatment is needed. In that case, the remaining doses should be delayed until after the pregnancy. Immunization is recommended for any person with an immunocompromised condition through the age of 26 years if she  did not get any or all doses earlier. During the 3-dose series, the second dose should be obtained 4-8 weeks after the first dose. The third dose should be obtained 24 weeks after the first dose and 16 weeks after the second dose.  Zoster vaccine. One dose is recommended for adults aged 60 years or older unless certain conditions are present.  Measles,   mumps, and rubella (MMR) vaccine. Adults born before [birth year] generally are considered immune to measles and mumps. Adults born in [birth year] or later should have 1 or more doses of MMR vaccine unless there is a contraindication to the vaccine or there is laboratory evidence of immunity to each of the three diseases. A routine second dose of MMR vaccine should be obtained at least 28 days after the first dose for students attending postsecondary schools, health care workers, or international travelers. People who received inactivated measles vaccine or an unknown type of measles vaccine during 1963-1967 should receive 2 doses of MMR vaccine. People who received inactivated mumps vaccine or an unknown type of mumps vaccine before 1979 and are at high risk for mumps infection should consider immunization with 2 doses of MMR vaccine. For females of childbearing age, rubella immunity should be determined. If there is no evidence of immunity, females who are not pregnant should be vaccinated. If there is no evidence of immunity, females who are pregnant should delay immunization until after pregnancy. Unvaccinated health care workers born before 50 who lack laboratory evidence of measles, mumps, or rubella immunity or laboratory confirmation of disease should consider measles and mumps immunization with 2 doses of MMR vaccine or rubella immunization with 1 dose of MMR vaccine.  Pneumococcal 13-valent conjugate (PCV13) vaccine. When indicated, a person who is uncertain of his immunization history and has no record of immunization should receive the PCV13 vaccine. All adults  50 years of age and older should receive this vaccine. An adult aged 35 years or older who has certain medical conditions and has not been previously immunized should receive 1 dose of PCV13 vaccine. This PCV13 should be followed with a dose of pneumococcal polysaccharide (PPSV23) vaccine. Adults who are at high risk for pneumococcal disease should obtain the PPSV23 vaccine at least 8 weeks after the dose of PCV13 vaccine. Adults older than 80 years of age who have normal immune system function should obtain the PPSV23 vaccine dose at least 1 year after the dose of PCV13 vaccine.  Pneumococcal polysaccharide (PPSV23) vaccine. When PCV13 is also indicated, PCV13 should be obtained first. All adults aged 83 years and older should be immunized. An adult younger than age 70 years who has certain medical conditions should be immunized. Any person who resides in a nursing home or long-term care facility should be immunized. An adult smoker should be immunized. People with an immunocompromised condition and certain other conditions should receive both PCV13 and PPSV23 vaccines. People with human immunodeficiency virus (HIV) infection should be immunized as soon as possible after diagnosis. Immunization during chemotherapy or radiation therapy should be avoided. Routine use of PPSV23 vaccine is not recommended for American Indians, Golden Natives, or people younger than 65 years unless there are medical conditions that require PPSV23 vaccine. When indicated, people who have unknown immunization and have no record of immunization should receive PPSV23 vaccine. One-time revaccination 5 years after the first dose of PPSV23 is recommended for people aged 19-64 years who have chronic kidney failure, nephrotic syndrome, asplenia, or immunocompromised conditions. People who received 1-2 doses of PPSV23 before age 76 years should receive another dose of PPSV23 vaccine at age 58 years or later if at least 5 years have passed since  the previous dose. Doses of PPSV23 are not needed for people immunized with PPSV23 at or after age 43 years.  Meningococcal vaccine. Adults with asplenia or persistent complement component deficiencies should receive 2 doses of quadrivalent meningococcal conjugate (  MenACWY-D) vaccine. The doses should be obtained at least 2 months apart. Microbiologists working with certain meningococcal bacteria, military recruits, people at risk during an outbreak, and people who travel to or live in countries with a high rate of meningitis should be immunized. A first-year college student up through age 21 years who is living in a residence hall should receive a dose if she did not receive a dose on or after her 16th birthday. Adults who have certain high-risk conditions should receive one or more doses of vaccine.  Hepatitis A vaccine. Adults who wish to be protected from this disease, have certain high-risk conditions, work with hepatitis A-infected animals, work in hepatitis A research labs, or travel to or work in countries with a high rate of hepatitis A should be immunized. Adults who were previously unvaccinated and who anticipate close contact with an international adoptee during the first 60 days after arrival in the [place] from a country with a high rate of hepatitis A should be immunized.  Hepatitis B vaccine. Adults who wish to be protected from this disease, have certain high-risk conditions, may be exposed to blood or other infectious body fluids, are household contacts or sex partners of hepatitis B positive people, are clients or workers in certain care facilities, or travel to or work in countries with a high rate of hepatitis B should be immunized.  Haemophilus influenzae type b (Hib) vaccine. A previously unvaccinated person with asplenia or sickle cell disease or having a scheduled splenectomy should receive 1 dose of Hib vaccine. Regardless of previous immunization, a recipient of a  hematopoietic stem cell transplant should receive a 3-dose series 6-12 months after her successful transplant. Hib vaccine is not recommended for adults with HIV infection. Preventive Services / Frequency Ages 19 to 39 years  Blood pressure check.** / Every 3-5 years.  Lipid and cholesterol check.** / Every 5 years beginning at age 20.  Clinical breast exam.** / Every 3 years for women in their 20s and 30s.  BRCA-related cancer risk assessment.** / For women who have family members with a BRCA-related cancer (breast, ovarian, tubal, or peritoneal cancers).  Pap test.** / Every 2 years from ages 21 through 29. Every 3 years starting at age 30 through age 65 or 70 with a history of 3 consecutive normal Pap tests.  HPV screening.** / Every 3 years from ages 30 through ages 65 to 70 with a history of 3 consecutive normal Pap tests.  Hepatitis C blood test.** / For any individual with known risks for hepatitis C.  Skin self-exam. / Monthly.  Influenza vaccine. / Every year.  Tetanus, diphtheria, and acellular pertussis (Tdap, Td) vaccine.** / Consult your health care provider. Pregnant women should receive 1 dose of Tdap vaccine during each pregnancy. 1 dose of Td every 10 years.  Varicella vaccine.** / Consult your health care provider. Pregnant females who do not have evidence of immunity should receive the first dose after pregnancy.  HPV vaccine. / 3 doses over 6 months, if 26 and younger. The vaccine is not recommended for use in pregnant females. However, pregnancy testing is not needed before receiving a dose.  Measles, mumps, rubella (MMR) vaccine.** / You need at least 1 dose of MMR if you were born in 1957 or later. You may also need a 2nd dose. For females of childbearing age, rubella immunity should be determined. If there is no evidence of immunity, females who are not pregnant should be vaccinated. If there is   no evidence of immunity, females who are pregnant should delay  immunization until after pregnancy.  Pneumococcal 13-valent conjugate (PCV13) vaccine.** / Consult your health care provider.  Pneumococcal polysaccharide (PPSV23) vaccine.** / 1 to 2 doses if you smoke cigarettes or if you have certain conditions.  Meningococcal vaccine.** / 1 dose if you are age 19 to 21 years and a first-year college student living in a residence hall, or have one of several medical conditions, you need to get vaccinated against meningococcal disease. You may also need additional booster doses.  Hepatitis A vaccine.** / Consult your health care provider.  Hepatitis B vaccine.** / Consult your health care provider.  Haemophilus influenzae type b (Hib) vaccine.** / Consult your health care provider. Ages 40 to 64 years  Blood pressure check.** / Every year.  Lipid and cholesterol check.** / Every 5 years beginning at age 20 years.  Lung cancer screening. / Every year if you are aged 55-80 years and have a 30-pack-year history of smoking and currently smoke or have quit within the past 15 years. Yearly screening is stopped once you have quit smoking for at least 15 years or develop a health problem that would prevent you from having lung cancer treatment.  Clinical breast exam.** / Every year after age 40 years.  BRCA-related cancer risk assessment.** / For women who have family members with a BRCA-related cancer (breast, ovarian, tubal, or peritoneal cancers).  Mammogram.** / Every year beginning at age 40 years and continuing for as long as you are in good health. Consult with your health care provider.  Pap test.** / Every 3 years starting at age 30 years through age 65 or 70 years with a history of 3 consecutive normal Pap tests.  HPV screening.** / Every 3 years from ages 30 years through ages 65 to 70 years with a history of 3 consecutive normal Pap tests.  Fecal occult blood test (FOBT) of stool. / Every year beginning at age 50 years and continuing until age  75 years. You may not need to do this test if you get a colonoscopy every 10 years.  Flexible sigmoidoscopy or colonoscopy.** / Every 5 years for a flexible sigmoidoscopy or every 10 years for a colonoscopy beginning at age 50 years and continuing until age 75 years.  Hepatitis C blood test.** / For all people born from 1945 through 1965 and any individual with known risks for hepatitis C.  Skin self-exam. / Monthly.  Influenza vaccine. / Every year.  Tetanus, diphtheria, and acellular pertussis (Tdap/Td) vaccine.** / Consult your health care provider. Pregnant women should receive 1 dose of Tdap vaccine during each pregnancy. 1 dose of Td every 10 years.  Varicella vaccine.** / Consult your health care provider. Pregnant females who do not have evidence of immunity should receive the first dose after pregnancy.  Zoster vaccine.** / 1 dose for adults aged 60 years or older.  Measles, mumps, rubella (MMR) vaccine.** / You need at least 1 dose of MMR if you were born in 1957 or later. You may also need a second dose. For females of childbearing age, rubella immunity should be determined. If there is no evidence of immunity, females who are not pregnant should be vaccinated. If there is no evidence of immunity, females who are pregnant should delay immunization until after pregnancy.  Pneumococcal 13-valent conjugate (PCV13) vaccine.** / Consult your health care provider.  Pneumococcal polysaccharide (PPSV23) vaccine.** / 1 to 2 doses if you smoke cigarettes or if you   have certain conditions.  Meningococcal vaccine.** / Consult your health care provider.  Hepatitis A vaccine.** / Consult your health care provider.  Hepatitis B vaccine.** / Consult your health care provider.  Haemophilus influenzae type b (Hib) vaccine.** / Consult your health care provider. Ages 65 years and over  Blood pressure check.** / Every year.  Lipid and cholesterol check.** / Every 5 years beginning at age 20  years.  Lung cancer screening. / Every year if you are aged 55-80 years and have a 30-pack-year history of smoking and currently smoke or have quit within the past 15 years. Yearly screening is stopped once you have quit smoking for at least 15 years or develop a health problem that would prevent you from having lung cancer treatment.  Clinical breast exam.** / Every year after age 40 years.  BRCA-related cancer risk assessment.** / For women who have family members with a BRCA-related cancer (breast, ovarian, tubal, or peritoneal cancers).  Mammogram.** / Every year beginning at age 40 years and continuing for as long as you are in good health. Consult with your health care provider.  Pap test.** / Every 3 years starting at age 30 years through age 65 or 70 years with 3 consecutive normal Pap tests. Testing can be stopped between 65 and 70 years with 3 consecutive normal Pap tests and no abnormal Pap or HPV tests in the past 10 years.  HPV screening.** / Every 3 years from ages 30 years through ages 65 or 70 years with a history of 3 consecutive normal Pap tests. Testing can be stopped between 65 and 70 years with 3 consecutive normal Pap tests and no abnormal Pap or HPV tests in the past 10 years.  Fecal occult blood test (FOBT) of stool. / Every year beginning at age 50 years and continuing until age 75 years. You may not need to do this test if you get a colonoscopy every 10 years.  Flexible sigmoidoscopy or colonoscopy.** / Every 5 years for a flexible sigmoidoscopy or every 10 years for a colonoscopy beginning at age 50 years and continuing until age 75 years.  Hepatitis C blood test.** / For all people born from 1945 through 1965 and any individual with known risks for hepatitis C.  Osteoporosis screening.** / A one-time screening for women ages 65 years and over and women at risk for fractures or osteoporosis.  Skin self-exam. / Monthly.  Influenza vaccine. / Every year.  Tetanus,  diphtheria, and acellular pertussis (Tdap/Td) vaccine.** / 1 dose of Td every 10 years.  Varicella vaccine.** / Consult your health care provider.  Zoster vaccine.** / 1 dose for adults aged 60 years or older.  Pneumococcal 13-valent conjugate (PCV13) vaccine.** / Consult your health care provider.  Pneumococcal polysaccharide (PPSV23) vaccine.** / 1 dose for all adults aged 65 years and older.  Meningococcal vaccine.** / Consult your health care provider.  Hepatitis A vaccine.** / Consult your health care provider.  Hepatitis B vaccine.** / Consult your health care provider.  Haemophilus influenzae type b (Hib) vaccine.** / Consult your health care provider. ** Family history and personal history of risk and conditions may change your health care provider's recommendations.   This information is not intended to replace advice given to you by your health care provider. Make sure you discuss any questions you have with your health care provider.   Document Released: 12/24/2001 Document Revised: 11/18/2014 Document Reviewed: 03/25/2011 Elsevier Interactive Patient Education 2016 [employer] Inc[employer]  

## 2015-12-12 NOTE — Progress Notes (Signed)
Subjective:    Patient ID: Karina [name], female    DOB: January 01, 1933, 82 y.o.   MRN: [id number]  Chief Complaint  Patient presents with  . Annual Exam    HPI Patient is in today for annual exam and follow up on numerous concerns. She is noting some increased palpitations recently without associated chest pain. Noting recent constipation. Has bowel movements most days but they are small, hard and sometimes difficult to pass. Continues to have some urinary urgency. No dysuria, hematuria or fevers. Does acknowledge some abdominal pain at times. Also notes some increase in thoracic back pain. No falls or trauma. Is managing ADLs at home well.   Past Medical History  Diagnosis Date  . SVT (supraventricular tachycardia) ([place]) 2007    3-4 beats.. Holter  . Sinus tachycardia ([facility])     mild-- retesting  . Fatigue 1/11    w/ exercise.. responded to increased beta blocker dose  . Dyslipidemia   . Anemia     iron deficiency  . GERD (gastroesophageal reflux disease)   . Osteoporosis   . Other specified cardiac dysrhythmias(427.89) 09/17/2010  . OSTEOPOROSIS 08/13/2006  . MEASLES, HX OF 01/28/2011  . HYPERLIPIDEMIA 12/17/2007  . GERD 12/28/2007  . HTN (hypertension) 03/06/2011  . DIVERTICULOSIS, COLON 01/28/2011  . CONSTIPATION 01/28/2011  . CHICKENPOX, HX OF 01/28/2011  . ANEMIA, CHRONIC 08/13/2006  . Post menopausal problems 03/06/2011  . Lesion of external ear 03/06/2011  . Sinusitis 12/16/2011  . Hypercholesteremia 03/10/2012  . Grief reaction 03/10/2012  . Ejection fraction     EF 55%, echo, 2007  . Normal cardiac stress test     Nuclear, 2004, normal  . Arthritis     left knee  . Atrophic vaginitis 10/03/2013  . Sinusitis, acute 11/02/2013  . Medicare annual wellness visit, subsequent 05/09/2014  . Endometriosis   . Lichen sclerosus et atrophicus of the vulva 08/10/2013  . Preventative health care 12/17/2015  . OAB (overactive bladder) 12/17/2015    Past Surgical History  Procedure  Laterality Date  . Abdominal hysterectomy      w/ unilateral oophorectomy-bleeding, ovarian cysts, left ovary left in place?  . Right knee arthroscopy, cleaned    . Breast surgery      Implants--Silicone    Family History  Problem Relation Age of Onset  . Stroke Mother   . Hypertension Mother   . Other Mother     CHF  . Transient ischemic attack Mother   . Cancer Mother 52    cervical  . Cancer Father     stomach, smoker  . Cancer Sister     brain  . Lymphoma Brother   . Cancer Brother   . Stroke Daughter     ? stroke  . Mitral valve prolapse Daughter   . Irritable bowel syndrome Daughter   . Migraines Daughter   . Hypertension Son   . Cancer Paternal Aunt     cancer  . Cancer Paternal Uncle     X 2 CA  . Heart attack Paternal Uncle   . Other Maternal Grandmother     CHF  . Alcohol abuse Brother   . Heart attack Brother     smoker    Social History   Social History  . Marital Status: Married    Spouse Name: N/A  . Number of Children: N/A  . Years of Education: N/A   Occupational History  . Not on file.   Social History Main Topics  .  Smoking status: Never Smoker   . Smokeless tobacco: Never Used  . Alcohol Use: No  . Drug Use: No  . Sexual Activity: No     Comment: TAH--Poss. left ovary remains   Other Topics Concern  . Not on file   Social History Narrative    Outpatient Prescriptions Prior to Visit  Medication Sig Dispense Refill  . ALPRAZolam (XANAX) 0.5 MG tablet Take 1 tablet (0.5 mg total) by mouth as needed for sleep or anxiety. 1/2 to 2 tabs po bid prn for anxiety, insomnia 90 tablet 0  . aspirin 81 MG tablet Take 81 mg by mouth daily.      Marland Kitchen BIOTIN PO Take 1 tablet by mouth daily.    . Calcium Carbonate-Vitamin D (CALTRATE 600+D) 600-400 MG-UNIT per tablet Take 1 tablet by mouth 2 (two) times daily.      . Cholecalciferol (VITAMIN D) 1000 UNITS capsule Take 1,000 Units by mouth daily.      . clobetasol ointment (TEMOVATE) 3.29 % Apply  1 application topically 2 (two) times daily. Use for one month only. Apply thin layer. 30 g 1  . co-enzyme Q-10 30 MG capsule Take 30 mg by mouth at bedtime.     . conjugated estrogens (PREMARIN) vaginal cream Place 1 Applicatorful vaginally. 1/4 gram per vagina once weekly.    Marland Kitchen diltiazem (CARDIZEM) 120 MG tablet TAKE 1 TABLET EVERY DAY 90 tablet 3  . diphenhydramine-acetaminophen (TYLENOL PM) 25-500 MG TABS Take 1 tablet by mouth at bedtime as needed (pain/sleep.).     Marland Kitchen fish oil-omega-3 fatty acids 1000 MG capsule Take 1 g by mouth daily.     . Lutein 6 MG CAPS Take 1 capsule by mouth daily.      . metoprolol succinate (TOPROL-XL) 50 MG 24 hr tablet Take 1 tablet (50 mg total) by mouth daily. 90 tablet 3  . oxybutynin (DITROPAN) 5 MG tablet TAKE 1/2 TABLET TWICE DAILY 90 tablet 1  . PARoxetine (PAXIL) 10 MG tablet TAKE 1 TABLET EVERY DAY 90 tablet 1  . polyethylene glycol powder (MIRALAX) powder Take 17 g by mouth daily as needed. 255 g 0  . simvastatin (ZOCOR) 10 MG tablet TAKE 1 TABLET (10 MG TOTAL) BY MOUTH DAILY AT 6 PM. 90 tablet 1  . cephALEXin (KEFLEX) 500 MG capsule Take 1 capsule (500 mg total) by mouth 4 (four) times daily. 40 capsule 0  . ondansetron (ZOFRAN) 4 MG tablet Take 1 tablet (4 mg total) by mouth every 6 (six) hours. 20 tablet 0   No facility-administered medications prior to visit.    No Known Allergies  Review of Systems  Respiratory: Positive for shortness of breath.   Cardiovascular: Positive for palpitations.       Superventricular tachycardia  Gastrointestinal: Positive for constipation. Negative for nausea, vomiting and abdominal pain.  Musculoskeletal: Positive for back pain.  Neurological: Negative for sensory change.       Objective:    Physical Exam  Constitutional: She is oriented to person, place, and time. She appears well-developed and well-nourished. No distress.  HENT:  Head: Normocephalic and atraumatic.  Eyes: Conjunctivae are normal.    Neck: Neck supple. No thyromegaly present.  Cardiovascular: Normal rate and regular rhythm.   Murmur heard. Pulmonary/Chest: Effort normal and breath sounds normal. No respiratory distress.  Abdominal: Soft. Bowel sounds are normal. She exhibits no distension and no mass. There is no tenderness.  Musculoskeletal: Normal range of motion. She exhibits no edema.  Lymphadenopathy:  She has no cervical adenopathy.  Neurological: She is alert and oriented to person, place, and time.  Skin: Skin is warm and dry.  Psychiatric: She has a normal mood and affect. Her behavior is normal.    BP 120/62 mmHg  Pulse 61  Temp(Src) 97.7 F (36.5 C) (Oral)  Ht 5' 3"  (1.6 m)  Wt 130 lb 8 oz (59.194 kg)  BMI 23.12 kg/m2  SpO2 97% Wt Readings from Last 3 Encounters:  12/12/15 130 lb 8 oz (59.194 kg)  06/15/15 130 lb (58.968 kg)  05/04/15 128 lb 12.8 oz (58.423 kg)     Lab Results  Component Value Date   WBC 8.3 12/12/2015   HGB 12.4 12/12/2015   HCT 37.8 12/12/2015   PLT 241.0 12/12/2015   GLUCOSE 84 12/12/2015   CHOL 194 12/12/2015   TRIG 168.0* 12/12/2015   HDL 67.70 12/12/2015   LDLDIRECT 156.5 03/10/2012   LDLCALC 92 12/12/2015   ALT 16 12/12/2015   AST 20 12/12/2015   NA 141 12/12/2015   K 3.8 12/12/2015   CL 101 12/12/2015   CREATININE 0.89 12/12/2015   BUN 20 12/12/2015   CO2 29 12/12/2015   TSH 2.16 12/12/2015    Lab Results  Component Value Date   TSH 2.16 12/12/2015   Lab Results  Component Value Date   WBC 8.3 12/12/2015   HGB 12.4 12/12/2015   HCT 37.8 12/12/2015   MCV 87.9 12/12/2015   PLT 241.0 12/12/2015   Lab Results  Component Value Date   NA 141 12/12/2015   K 3.8 12/12/2015   CO2 29 12/12/2015   GLUCOSE 84 12/12/2015   BUN 20 12/12/2015   CREATININE 0.89 12/12/2015   BILITOT 0.3 12/12/2015   ALKPHOS 87 12/12/2015   AST 20 12/12/2015   ALT 16 12/12/2015   PROT 8.2 12/12/2015   ALBUMIN 4.5 12/12/2015   CALCIUM 10.5 12/12/2015   ANIONGAP 9  10/31/2015   GFR 64.83 12/12/2015   Lab Results  Component Value Date   CHOL 194 12/12/2015   Lab Results  Component Value Date   HDL 67.70 12/12/2015   Lab Results  Component Value Date   LDLCALC 92 12/12/2015   Lab Results  Component Value Date   TRIG 168.0* 12/12/2015   Lab Results  Component Value Date   CHOLHDL 3 12/12/2015   No results found for: HGBA1C     Assessment & Plan:   Problem List Items Addressed This Visit    Constipation    Encouraged increased hydration and fiber in diet. Daily probiotics. If bowels not moving can use MOM 2 tbls po in 4 oz of warm prune juice by mouth every 2-3 days. If no results then repeat in 4 hours with  Dulcolax suppository pr, may repeat again in 4 more hours as needed. Seek care if symptoms worsen. Consider daily Miralax and/or Dulcolax if symptoms persist. Xray confirms. She will report if not improved.       Relevant Orders   CBC (Completed)   Comp Met (CMET) (Completed)   TSH (Completed)   Urine culture (Completed)   Urinalysis (Completed)   DG Abd 2 Views (Completed)   GERD    Avoid offending foods, start probiotics. Do not eat large meals in late evening and consider raising head of bed.       Relevant Medications   ranitidine (ZANTAC) 150 MG tablet   Hyperlipidemia    Tolerating statin, encouraged heart healthy diet, avoid trans fats, minimize  simple carbs and saturated fats. Increase exercise as tolerated      Neck pain, chronic    Tylenol prn has been somewhat helpful.       OAB (overactive bladder)    Oxybutynin 5 mg 1/2 tab po bid       Preventative health care    Patient encouraged to maintain heart healthy diet, regular exercise, adequate sleep. Consider daily probiotics. Take medications as prescribed. Labs reviewed. Patient declines further colonoscopies for screening purposes. Bone density ordered today      SVT (supraventricular tachycardia) ([facility]) - Primary    Dr Etter Sjogren is her new heart doctor,  her previous cardiologist, Dr Ron [name] has retired. Has appt in May 2017       Other Visit Diagnoses    Elevated WBCs        Relevant Orders    CBC (Completed)    Comp Met (CMET) (Completed)    TSH (Completed)    Urine culture (Completed)    Urinalysis (Completed)    Hyperlipemia        Relevant Orders    Lipid panel (Completed)    CBC (Completed)    Comp Met (CMET) (Completed)    TSH (Completed)    Urine culture (Completed)    Urinalysis (Completed)    UTI (lower urinary tract infection)        Relevant Orders    CBC (Completed)    Comp Met (CMET) (Completed)    TSH (Completed)    Urine culture (Completed)    Urinalysis (Completed)    Low back pain without sciatica, unspecified back pain laterality        Relevant Orders    DG Chest 2 View (Completed)    Postmenopausal estrogen deficiency        Relevant Orders    DG Bone Density       I have discontinued Ms. Diesing's cephALEXin and ondansetron. I am also having her start on ranitidine. Additionally, I am having her maintain her aspirin, Calcium Carbonate-Vitamin D, fish oil-omega-3 fatty acids, Vitamin D, Lutein, polyethylene glycol powder, BIOTIN PO, diphenhydramine-acetaminophen, co-enzyme Q-10, diltiazem, metoprolol succinate, conjugated estrogens, clobetasol ointment, simvastatin, ALPRAZolam, PARoxetine, and oxybutynin.  Meds ordered this encounter  Medications  . ranitidine (ZANTAC) 150 MG tablet    Sig: Take 1 tablet (150 mg total) by mouth at bedtime as needed for heartburn.    Dispense:  30 tablet    Refill:  2      Willette Alma, MD

## 2015-12-12 NOTE — Assessment & Plan Note (Signed)
Dr Etter Sjogren is her new heart doctor, her previous cardiologist, Dr Ron [name] has retired. Has appt in May 2017

## 2015-12-12 NOTE — Progress Notes (Signed)
Pre visit review using our clinic review tool, if applicable. No additional management support is needed unless otherwise documented below in the visit note. 

## 2015-12-13 LAB — TSH: TSH: 2.16 u[IU]/mL (ref 0.35–4.50)

## 2015-12-13 LAB — COMPREHENSIVE METABOLIC PANEL
ALBUMIN: 4.5 g/dL (ref 3.5–5.2)
ALK PHOS: 87 U/L (ref 39–117)
ALT: 16 U/L (ref 0–35)
AST: 20 U/L (ref 0–37)
BILIRUBIN TOTAL: 0.3 mg/dL (ref 0.2–1.2)
BUN: 20 mg/dL (ref 6–23)
CO2: 29 mEq/L (ref 19–32)
Calcium: 10.5 mg/dL (ref 8.4–10.5)
Chloride: 101 mEq/L (ref 96–112)
Creatinine, Ser: 0.89 mg/dL (ref 0.40–1.20)
GFR: 64.83 mL/min (ref >60.00)
GLUCOSE: 84 mg/dL (ref 70–99)
POTASSIUM: 3.8 meq/L (ref 3.5–5.1)
Sodium: 141 mEq/L (ref 135–145)
TOTAL PROTEIN: 8.2 g/dL (ref 6.0–8.3)

## 2015-12-13 LAB — URINALYSIS
Bilirubin Urine: NEGATIVE
HGB URINE DIPSTICK: NEGATIVE
Ketones, ur: NEGATIVE
Leukocytes, UA: NEGATIVE
NITRITE: NEGATIVE
PH: 7 (ref 5.0–8.0)
Specific Gravity, Urine: 1.01 (ref 1.000–1.030)
TOTAL PROTEIN, URINE-UPE24: NEGATIVE
URINE GLUCOSE: NEGATIVE
Urobilinogen, UA: 0.2 (ref 0.0–1.0)

## 2015-12-13 LAB — LIPID PANEL
CHOLESTEROL: 194 mg/dL (ref 0–200)
HDL: 67.7 mg/dL (ref >39.00)
LDL Cholesterol: 92 mg/dL (ref 0–99)
NONHDL: 126.08
Total CHOL/HDL Ratio: 3
Triglycerides: 168 mg/dL — ABNORMAL HIGH (ref 0.0–149.0)
VLDL: 33.6 mg/dL (ref 0.0–40.0)

## 2015-12-13 LAB — CBC
HEMATOCRIT: 37.8 % (ref 36.0–46.0)
HEMOGLOBIN: 12.4 g/dL (ref 12.0–15.0)
MCHC: 32.9 g/dL (ref 30.0–36.0)
MCV: 87.9 fl (ref 78.0–100.0)
Platelets: 241 10*3/uL (ref 150.0–400.0)
RBC: 4.3 Mil/uL (ref 3.87–5.11)
RDW: 13.6 % (ref 11.5–15.5)
WBC: 8.3 10*3/uL (ref 4.0–10.5)

## 2015-12-13 LAB — URINE CULTURE
Colony Count: NO GROWTH
ORGANISM ID, BACTERIA: NO GROWTH

## 2015-12-17 ENCOUNTER — Encounter: Payer: Self-pay | Admitting: Family Medicine

## 2015-12-17 DIAGNOSIS — N3281 Overactive bladder: Secondary | ICD-10-CM | POA: Insufficient documentation

## 2015-12-17 DIAGNOSIS — Z0000 Encounter for general adult medical examination without abnormal findings: Secondary | ICD-10-CM | POA: Insufficient documentation

## 2015-12-17 HISTORY — DX: Overactive bladder: N32.81

## 2015-12-17 HISTORY — DX: Encounter for general adult medical examination without abnormal findings: Z00.00

## 2015-12-17 NOTE — Assessment & Plan Note (Signed)
Tylenol prn has been somewhat helpful.

## 2015-12-17 NOTE — Assessment & Plan Note (Signed)
Encouraged increased hydration and fiber in diet. Daily probiotics. If bowels not moving can use MOM 2 tbls po in 4 oz of warm prune juice by mouth every 2-3 days. If no results then repeat in 4 hours with  Dulcolax suppository pr, may repeat again in 4 more hours as needed. Seek care if symptoms worsen. Consider daily Miralax and/or Dulcolax if symptoms persist. Xray confirms. She will report if not improved.

## 2015-12-17 NOTE — Assessment & Plan Note (Addendum)
Patient encouraged to maintain heart healthy diet, regular exercise, adequate sleep. Consider daily probiotics. Take medications as prescribed. Labs reviewed. Patient declines further colonoscopies for screening purposes. Bone density ordered today

## 2015-12-17 NOTE — Assessment & Plan Note (Signed)
Avoid offending foods, start probiotics. Do not eat large meals in late evening and consider raising head of bed.  

## 2015-12-17 NOTE — Assessment & Plan Note (Addendum)
Oxybutynin 5 mg 1/2 tab po bid

## 2015-12-17 NOTE — Assessment & Plan Note (Signed)
Well controlled, no changes to meds. Encouraged heart healthy diet such as the DASH diet and exercise as tolerated.  °

## 2015-12-17 NOTE — Assessment & Plan Note (Signed)
Tolerating statin, encouraged heart healthy diet, avoid trans fats, minimize simple carbs and saturated fats. Increase exercise as tolerated 

## 2015-12-18 ENCOUNTER — Ambulatory Visit (HOSPITAL_BASED_OUTPATIENT_CLINIC_OR_DEPARTMENT_OTHER)
Admission: RE | Admit: 2015-12-18 | Discharge: 2015-12-18 | Disposition: A | Discharge status: Home / Self Care | Payer: Commercial Managed Care - HMO | Source: Ambulatory Visit | Attending: Family Medicine | Admitting: Family Medicine

## 2015-12-18 DIAGNOSIS — Z780 Asymptomatic menopausal state: Secondary | ICD-10-CM | POA: Diagnosis not present

## 2015-12-18 DIAGNOSIS — M810 Age-related osteoporosis without current pathological fracture: Secondary | ICD-10-CM | POA: Diagnosis not present

## 2015-12-18 DIAGNOSIS — Z90710 Acquired absence of both cervix and uterus: Secondary | ICD-10-CM | POA: Diagnosis not present

## 2015-12-18 DIAGNOSIS — F17200 Nicotine dependence, unspecified, uncomplicated: Secondary | ICD-10-CM | POA: Insufficient documentation

## 2015-12-18 DIAGNOSIS — E2839 Other primary ovarian failure: Secondary | ICD-10-CM | POA: Diagnosis not present

## 2015-12-19 ENCOUNTER — Telehealth: Payer: Self-pay | Admitting: Family Medicine

## 2015-12-19 NOTE — Telephone Encounter (Signed)
I have electronically submitted pt's info for Prol[employer] insurance verification and will notify you once I have a response. Thank you. °

## 2015-12-19 NOTE — Telephone Encounter (Signed)
Rose, Could you please look into Prolia cost for this patient.  She states she has taken almost everything for osteoporosis years ago and is intereseted in [place]. Thanks,

## 2015-12-24 ENCOUNTER — Other Ambulatory Visit: Payer: Self-pay | Admitting: Cardiology

## 2015-12-26 NOTE — Telephone Encounter (Signed)
I have rec'd pt's insurance verification for Prolia, however, [employer] Rossetti is requiring a prior authorization.  I have completed the authorization and faxed it along w/the Dexa scan from 12/18/2015 to [facility] and will notify you once I have a response. Thank you.

## 2016-01-02 ENCOUNTER — Ambulatory Visit (INDEPENDENT_AMBULATORY_CARE_PROVIDER_SITE_OTHER): Payer: Commercial Managed Care - HMO | Admitting: Cardiology

## 2016-01-02 ENCOUNTER — Encounter: Payer: Self-pay | Admitting: Cardiology

## 2016-01-02 VITALS — BP 138/82 | HR 77 | Ht 63.0 in | Wt 131.8 lb

## 2016-01-02 DIAGNOSIS — I471 Supraventricular tachycardia: Secondary | ICD-10-CM | POA: Diagnosis not present

## 2016-01-02 NOTE — Patient Instructions (Signed)

## 2016-01-02 NOTE — Progress Notes (Signed)
Cardiology Office Note    Date:  01/02/2016   ID:  [name], [name] [birth year], MRN [id number]  PCP:  Penni Homans, MD  Cardiologist:   Candee Furbish, MD (Former Dr. Ron [name])    History of Present Illness:  Karina [name] is a 82 y.o. female former patient of Dr. Ron [name] with history of palpitations, one brief episode of supraventricular tachycardia documented on a Holter monitor with no documented atrial fibrillation. Rare intermittent palpitations. She had 4 beats of supraventricular tachycardia on one Holter monitor. No reason for anticoagulation.  Doing well, no shortness of breath, no chest pain. Unfortunately her brother-in-law just died, had a mass in his lungs.   Past Medical History  Diagnosis Date  . SVT (supraventricular tachycardia) ([place]) 2007    3-4 beats.. Holter  . Sinus tachycardia (HCC)     mild-- retesting  . Fatigue 1/11    w/ exercise.. responded to increased beta blocker dose  . Dyslipidemia   . Anemia     iron deficiency  . GERD (gastroesophageal reflux disease)   . Osteoporosis   . Other specified cardiac dysrhythmias(427.89) 09/17/2010  . OSTEOPOROSIS 08/13/2006  . MEASLES, HX OF 01/28/2011  . HYPERLIPIDEMIA 12/17/2007  . GERD 12/28/2007  . HTN (hypertension) 03/06/2011  . DIVERTICULOSIS, COLON 01/28/2011  . CONSTIPATION 01/28/2011  . CHICKENPOX, HX OF 01/28/2011  . ANEMIA, CHRONIC 08/13/2006  . Post menopausal problems 03/06/2011  . Lesion of external ear 03/06/2011  . Sinusitis 12/16/2011  . Hypercholesteremia 03/10/2012  . Grief reaction 03/10/2012  . Ejection fraction     EF 55%, echo, 2007  . Normal cardiac stress test     Nuclear, 2004, normal  . Arthritis     left knee  . Atrophic vaginitis 10/03/2013  . Sinusitis, acute 11/02/2013  . Medicare annual wellness visit, subsequent 05/09/2014  . Endometriosis   . Lichen sclerosus et atrophicus of the vulva 08/10/2013  . Preventative health care 12/17/2015  . OAB (overactive bladder) 12/17/2015     Past Surgical History  Procedure Laterality Date  . Abdominal hysterectomy      w/ unilateral oophorectomy-bleeding, ovarian cysts, left ovary left in place?  . Right knee arthroscopy, cleaned    . Breast surgery      Implants--Silicone    Outpatient Prescriptions Prior to Visit  Medication Sig Dispense Refill  . ALPRAZolam (XANAX) 0.5 MG tablet Take 1 tablet (0.5 mg total) by mouth as needed for sleep or anxiety. 1/2 to 2 tabs po bid prn for anxiety, insomnia 90 tablet 0  . aspirin 81 MG tablet Take 81 mg by mouth daily.      Marland Kitchen BIOTIN PO Take 1 tablet by mouth daily.    . Calcium Carbonate-Vitamin D (CALTRATE 600+D) 600-400 MG-UNIT per tablet Take 1 tablet by mouth 2 (two) times daily.      . Cholecalciferol (VITAMIN D) 1000 UNITS capsule Take 1,000 Units by mouth daily.      . clobetasol ointment (TEMOVATE) AB-123456789 % Apply 1 application topically 2 (two) times daily. Use for one month only. Apply thin layer. 30 g 1  . co-enzyme Q-10 30 MG capsule Take 30 mg by mouth at bedtime.     . conjugated estrogens (PREMARIN) vaginal cream Place 1 Applicatorful vaginally. 1/4 gram per vagina once weekly.    Marland Kitchen diltiazem (CARDIZEM) 120 MG tablet Take 1 tablet (120 mg total) by mouth daily. 90 tablet 0  . diphenhydramine-acetaminophen (TYLENOL PM) 25-500 MG TABS Take 1 tablet by  mouth at bedtime as needed (pain/sleep.).     Marland Kitchen fish oil-omega-3 fatty acids 1000 MG capsule Take 1 g by mouth daily.     . Lutein 6 MG CAPS Take 1 capsule by mouth daily.      . metoprolol succinate (TOPROL-XL) 50 MG 24 hr tablet Take 1 tablet (50 mg total) by mouth daily. 90 tablet 3  . oxybutynin (DITROPAN) 5 MG tablet TAKE 1/2 TABLET TWICE DAILY 90 tablet 1  . PARoxetine (PAXIL) 10 MG tablet TAKE 1 TABLET EVERY DAY 90 tablet 1  . polyethylene glycol powder (MIRALAX) powder Take 17 g by mouth daily as needed. 255 g 0  . ranitidine (ZANTAC) 150 MG tablet Take 1 tablet (150 mg total) by mouth at bedtime as needed for  heartburn. 30 tablet 2  . simvastatin (ZOCOR) 10 MG tablet TAKE 1 TABLET (10 MG TOTAL) BY MOUTH DAILY AT 6 PM. 90 tablet 1   No facility-administered medications prior to visit.     Allergies:   Review of patient's allergies indicates no known allergies.   Social History   Social History  . Marital Status: Married    Spouse Name: N/A  . Number of Children: N/A  . Years of Education: N/A   Social History Main Topics  . Smoking status: Never Smoker   . Smokeless tobacco: Never Used  . Alcohol Use: No  . Drug Use: No  . Sexual Activity: No     Comment: TAH--Poss. left ovary remains   Other Topics Concern  . None   Social History Narrative     Family History:  The patient's family history includes Alcohol abuse in her brother; Cancer in her brother, father, paternal aunt, paternal uncle, and sister; Cancer (age of onset: 81) in her mother; Heart attack in her brother and paternal uncle; Hypertension in her mother and son; Irritable bowel syndrome in her daughter; Lymphoma in her brother; Migraines in her daughter; Mitral valve prolapse in her daughter; Other in her maternal grandmother and mother; Stroke in her daughter and mother; Transient ischemic attack in her mother.   ROS:   Please see the history of present illness.    ROS All other systems reviewed and are negative.   PHYSICAL EXAM:   VS:  BP 138/82 mmHg  Pulse 77  Ht 5\' 3"  (1.6 m)  Wt 131 lb 12.8 oz (59.784 kg)  BMI 23.35 kg/m2  SpO2 98%   GEN: Well nourished, well developed, in no acute distress HEENT: normal Neck: no JVD, carotid bruits, or masses Cardiac: RRR; no murmurs, rubs, or gallops,no edema  Respiratory:  clear to auscultation bilaterally, normal work of breathing GI: soft, nontender, nondistended, + BS MS: no deformity or atrophy Skin: warm and dry, no rash Neuro:  Alert and Oriented x 3, Strength and sensation are intact Psych: euthymic mood, full affect  Wt Readings from Last 3 Encounters:   01/02/16 131 lb 12.8 oz (59.784 kg)  12/12/15 130 lb 8 oz (59.194 kg)  06/15/15 130 lb (58.968 kg)      Studies/Labs Reviewed:   EKG:  EKG is not ordered today.  Recent Labs: 12/12/2015: ALT 16; BUN 20; Creatinine, Ser 0.89; Hemoglobin 12.4; Platelets 241.0; Potassium 3.8; Sodium 141; TSH 2.16   Lipid Panel    Component Value Date/Time   CHOL 194 12/12/2015 1548   TRIG 168.0* 12/12/2015 1548   HDL 67.70 12/12/2015 1548   CHOLHDL 3 12/12/2015 1548   VLDL 33.6 12/12/2015 1548   LDLCALC  92 12/12/2015 1548   LDLDIRECT 156.5 03/10/2012 0921    Additional studies/ records that were reviewed today include:  Prior office notes reviewed, EKG reviewed, lab work reviewed    ASSESSMENT:    1. SVT (supraventricular tachycardia) (HCC)      PLAN:  In order of problems listed above:  1. PSVT-brief episode-continue with metoprolol. Not atrial fibrillation. No anticoagulation needed. Overall doing well. Currently grieving the loss of her brother-in-law.    Medication Adjustments/Labs and Tests Ordered: Current medicines are reviewed at length with the patient today.  Concerns regarding medicines are outlined above.  Medication changes, Labs and Tests ordered today are listed in the Patient Instructions below. Patient Instructions  Medication Instructions:  The current medical regimen is effective;  continue present plan and medications.  Follow-Up: Follow up in 1 year with Dr. Marlou Porch.  You will receive a letter in the mail 2 months before you are due.  Please call us when you receive this letter to schedule your follow up appointment.  If you need a refill on your cardiac medications before your next appointment, please call your pharmacy.  Thank you for choosing [facility]!!             Signed, Candee Furbish, MD  01/02/2016 10:45 AM    [facility] [place], [place], [place]  [address] Phone: [phone]; Fax: [phone]

## 2016-01-15 NOTE — Telephone Encounter (Signed)
Since I have not rec'd anything from [facility] in ref to Ms. Rosita Fire' p/a for Prolia I called them today.  I spoke to [place] and she says Ms. Beller was approved for ONE inj, eff 01/02/2016-07/02/2016, Josem Kaufmann [id number].  Ms. Maclin will have an estimated responsibility of $240. Please make pt aware this is an estimate and we will not know an exact amt until insurance(s) has/have paid.  I have sent a copy of the summary of benefits to be scanned into pt's chart.   If pt cannot afford $195 for her injection, please advise her to contact Prol[employer] at [phone] and select option #1 to see if she qualifies for one of their assistance programs.  If she qualifies they will instruct her how to proceed.  If you have any questions, please let me know.   Once pt recs injection, please let me know actual injection date so I can update the Prolia portal.  If you have any questions, please let me know.

## 2016-01-15 NOTE — Telephone Encounter (Signed)
Called left message to call back 

## 2016-01-16 NOTE — Telephone Encounter (Signed)
Called left message to call back 

## 2016-01-17 NOTE — Telephone Encounter (Signed)
Called the patient informed of all information regarding Prolia cost.  At this time she has done some research (not completely happy with what she has learned about Prolia) and is declining the injections.  She is out of town for 2 months and has joined a Engineer, building services doing exercise classes for Osteoporosis.  She will discuss other options at her next appt. With PCP on 06/10/2016.

## 2016-01-22 DIAGNOSIS — R6889 Other general symptoms and signs: Secondary | ICD-10-CM | POA: Diagnosis not present

## 2016-01-22 DIAGNOSIS — J208 Acute bronchitis due to other specified organisms: Secondary | ICD-10-CM | POA: Diagnosis not present

## 2016-02-17 ENCOUNTER — Other Ambulatory Visit: Payer: Self-pay | Admitting: Cardiology

## 2016-03-13 ENCOUNTER — Other Ambulatory Visit: Payer: Self-pay | Admitting: Family Medicine

## 2016-03-29 ENCOUNTER — Ambulatory Visit: Payer: Commercial Managed Care - HMO | Admitting: Obstetrics and Gynecology

## 2016-03-29 ENCOUNTER — Other Ambulatory Visit: Payer: Self-pay | Admitting: Obstetrics and Gynecology

## 2016-03-29 ENCOUNTER — Telehealth: Payer: Self-pay | Admitting: Obstetrics and Gynecology

## 2016-03-29 NOTE — Telephone Encounter (Signed)
Call to patient. Advised Dr Quincy Simmonds can refill 30 day supply of Paxil and Oxybutynin.  Will then either need to get from PCP or have problem office visit. Patient declines 30 day refill for now. States she has 2 weeks left and will contact PCP first. She will call back and let me know if desires refill and appointment.  Encounter closed.

## 2016-03-29 NOTE — Telephone Encounter (Signed)
Patient cancelled appointment for today routine annual exam and states she plans to have her exam with her PCP.  She requested refills of her medications:  Paxil and Oxybutynin.  I will refill for one month of each.  Other refills will require an office problem visit.  She may have her PCP prescribe these if she wishes.

## 2016-03-29 NOTE — Telephone Encounter (Signed)
Patient cancelled aex appointment today. See staff message. °

## 2016-04-16 ENCOUNTER — Other Ambulatory Visit: Payer: Self-pay | Admitting: Cardiology

## 2016-05-24 ENCOUNTER — Encounter (HOSPITAL_BASED_OUTPATIENT_CLINIC_OR_DEPARTMENT_OTHER): Payer: Self-pay | Admitting: *Deleted

## 2016-05-24 ENCOUNTER — Emergency Department (HOSPITAL_BASED_OUTPATIENT_CLINIC_OR_DEPARTMENT_OTHER)
Admission: EM | Admit: 2016-05-24 | Discharge: 2016-05-24 | Disposition: A | Discharge status: Home / Self Care | Payer: Commercial Managed Care - HMO | Attending: Emergency Medicine | Admitting: Emergency Medicine

## 2016-05-24 ENCOUNTER — Emergency Department (HOSPITAL_BASED_OUTPATIENT_CLINIC_OR_DEPARTMENT_OTHER): Payer: Commercial Managed Care - HMO

## 2016-05-24 DIAGNOSIS — Z7982 Long term (current) use of aspirin: Secondary | ICD-10-CM | POA: Insufficient documentation

## 2016-05-24 DIAGNOSIS — Y999 Unspecified external cause status: Secondary | ICD-10-CM | POA: Diagnosis not present

## 2016-05-24 DIAGNOSIS — Z79899 Other long term (current) drug therapy: Secondary | ICD-10-CM | POA: Diagnosis not present

## 2016-05-24 DIAGNOSIS — S20212A Contusion of left front wall of thorax, initial encounter: Secondary | ICD-10-CM | POA: Insufficient documentation

## 2016-05-24 DIAGNOSIS — Y92410 Unspecified street and highway as the place of occurrence of the external cause: Secondary | ICD-10-CM | POA: Insufficient documentation

## 2016-05-24 DIAGNOSIS — I10 Essential (primary) hypertension: Secondary | ICD-10-CM | POA: Insufficient documentation

## 2016-05-24 DIAGNOSIS — S299XXA Unspecified injury of thorax, initial encounter: Secondary | ICD-10-CM | POA: Diagnosis not present

## 2016-05-24 DIAGNOSIS — S9032XA Contusion of left foot, initial encounter: Secondary | ICD-10-CM | POA: Insufficient documentation

## 2016-05-24 DIAGNOSIS — Y9389 Activity, other specified: Secondary | ICD-10-CM | POA: Insufficient documentation

## 2016-05-24 DIAGNOSIS — S51812A Laceration without foreign body of left forearm, initial encounter: Secondary | ICD-10-CM | POA: Diagnosis not present

## 2016-05-24 DIAGNOSIS — R079 Chest pain, unspecified: Secondary | ICD-10-CM | POA: Diagnosis not present

## 2016-05-24 DIAGNOSIS — E785 Hyperlipidemia, unspecified: Secondary | ICD-10-CM | POA: Diagnosis not present

## 2016-05-24 DIAGNOSIS — S59912A Unspecified injury of left forearm, initial encounter: Secondary | ICD-10-CM | POA: Diagnosis present

## 2016-05-24 DIAGNOSIS — R0602 Shortness of breath: Secondary | ICD-10-CM | POA: Diagnosis not present

## 2016-05-24 DIAGNOSIS — T148XXA Other injury of unspecified body region, initial encounter: Secondary | ICD-10-CM
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
## 2016-06-10 ENCOUNTER — Ambulatory Visit (INDEPENDENT_AMBULATORY_CARE_PROVIDER_SITE_OTHER): Payer: Commercial Managed Care - HMO | Admitting: Family Medicine

## 2016-06-10 ENCOUNTER — Encounter: Payer: Self-pay | Admitting: Family Medicine

## 2016-06-10 VITALS — BP 112/74 | HR 80 | Temp 98.0°F | Ht 63.0 in | Wt 130.4 lb

## 2016-06-10 DIAGNOSIS — G8929 Other chronic pain: Secondary | ICD-10-CM

## 2016-06-10 DIAGNOSIS — E785 Hyperlipidemia, unspecified: Secondary | ICD-10-CM | POA: Diagnosis not present

## 2016-06-10 DIAGNOSIS — K219 Gastro-esophageal reflux disease without esophagitis: Secondary | ICD-10-CM

## 2016-06-10 DIAGNOSIS — I10 Essential (primary) hypertension: Secondary | ICD-10-CM

## 2016-06-10 DIAGNOSIS — M542 Cervicalgia: Secondary | ICD-10-CM

## 2016-06-10 DIAGNOSIS — M79675 Pain in left toe(s): Secondary | ICD-10-CM

## 2016-06-10 DIAGNOSIS — R000 Tachycardia, unspecified: Secondary | ICD-10-CM

## 2016-06-10 DIAGNOSIS — F418 Other specified anxiety disorders: Secondary | ICD-10-CM

## 2016-06-10 DIAGNOSIS — D539 Nutritional anemia, unspecified: Secondary | ICD-10-CM

## 2016-06-10 DIAGNOSIS — M810 Age-related osteoporosis without current pathological fracture: Secondary | ICD-10-CM | POA: Diagnosis not present

## 2016-06-10 LAB — VITAMIN D 25 HYDROXY (VIT D DEFICIENCY, FRACTURES): VITD: 59.33 ng/mL (ref 30.00–100.00)

## 2016-06-10 LAB — COMPREHENSIVE METABOLIC PANEL
ALK PHOS: 100 U/L (ref 39–117)
ALT: 21 U/L (ref 0–35)
AST: 21 U/L (ref 0–37)
Albumin: 4.3 g/dL (ref 3.5–5.2)
BILIRUBIN TOTAL: 0.4 mg/dL (ref 0.2–1.2)
BUN: 19 mg/dL (ref 6–23)
CALCIUM: 10.4 mg/dL (ref 8.4–10.5)
CO2: 32 meq/L (ref 19–32)
Chloride: 102 mEq/L (ref 96–112)
Creatinine, Ser: 0.92 mg/dL (ref 0.40–1.20)
GFR: 62.32 mL/min (ref >60.00)
Glucose, Bld: 94 mg/dL (ref 70–99)
POTASSIUM: 4.3 meq/L (ref 3.5–5.1)
Sodium: 140 mEq/L (ref 135–145)
Total Protein: 8.1 g/dL (ref 6.0–8.3)

## 2016-06-10 LAB — CBC
HEMATOCRIT: 34.6 % — AB (ref 36.0–46.0)
HEMOGLOBIN: 11.6 g/dL — AB (ref 12.0–15.0)
MCHC: 33.7 g/dL (ref 30.0–36.0)
MCV: 84.9 fl (ref 78.0–100.0)
PLATELETS: 240 10*3/uL (ref 150.0–400.0)
RBC: 4.08 Mil/uL (ref 3.87–5.11)
RDW: 13.4 % (ref 11.5–15.5)
WBC: 7.9 10*3/uL (ref 4.0–10.5)

## 2016-06-10 LAB — LIPID PANEL
CHOL/HDL RATIO: 3
Cholesterol: 206 mg/dL — ABNORMAL HIGH (ref 0–200)
HDL: 65.8 mg/dL (ref >39.00)
LDL Cholesterol: 103 mg/dL — ABNORMAL HIGH (ref 0–99)
NONHDL: 139.98
TRIGLYCERIDES: 187 mg/dL — AB (ref 0.0–149.0)
VLDL: 37.4 mg/dL (ref 0.0–40.0)

## 2016-06-10 LAB — TSH: TSH: 1.64 u[IU]/mL (ref 0.35–4.50)

## 2016-06-10 LAB — URIC ACID: Uric Acid, Serum: 5.9 mg/dL (ref 2.4–7.0)

## 2016-06-10 MED ORDER — PAROXETINE HCL 10 MG PO TABS: 10.0000 mg | ORAL_TABLET | Freq: Every day | ORAL | 1 refills | Status: DC

## 2016-06-10 MED ORDER — RANITIDINE HCL 150 MG PO TABS: 150.0000 mg | ORAL_TABLET | Freq: Every evening | ORAL | 1 refills | Status: DC | PRN

## 2016-06-10 MED ORDER — OXYBUTYNIN CHLORIDE 5 MG PO TABS: 2.5000 mg | ORAL_TABLET | Freq: Two times a day (BID) | ORAL | 1 refills | Status: DC

## 2016-06-10 NOTE — Patient Instructions (Signed)
Salon pas or aspercreme gels or patches to painful areas twice daily  Tylenol 2 tabs twice daily x 2 weeks Hypertension Hypertension, commonly called high blood pressure, is when the force of blood pumping through your arteries is too strong. Your arteries are the blood vessels that carry blood from your heart throughout your body. A blood pressure reading consists of a higher number over a lower number, such as 110/72. The higher number (systolic) is the pressure inside your arteries when your heart pumps. The lower number (diastolic) is the pressure inside your arteries when your heart relaxes. Ideally you want your blood pressure below 120/80. Hypertension forces your heart to work harder to pump blood. Your arteries may become narrow or stiff. Having untreated or uncontrolled hypertension can cause heart attack, stroke, kidney disease, and other problems. RISK FACTORS Some risk factors for high blood pressure are controllable. Others are not.  Risk factors you cannot control include:   Race. You may be at higher risk if you are African American.  Age. Risk increases with age.  Gender. Men are at higher risk than women before age 79 years. After age 71, women are at higher risk than men. Risk factors you can control include:  Not getting enough exercise or physical activity.  Being overweight.  Getting too much fat, sugar, calories, or salt in your diet.  Drinking too much alcohol. SIGNS AND SYMPTOMS Hypertension does not usually cause signs or symptoms. Extremely high blood pressure (hypertensive crisis) may cause headache, anxiety, shortness of breath, and nosebleed. DIAGNOSIS To check if you have hypertension, your health care provider will measure your blood pressure while you are seated, with your arm held at the level of your heart. It should be measured at least twice using the same arm. Certain conditions can cause a difference in blood pressure between your right and left arms.  A blood pressure reading that is higher than normal on one occasion does not mean that you need treatment. If it is not clear whether you have high blood pressure, you may be asked to return on a different day to have your blood pressure checked again. Or, you may be asked to monitor your blood pressure at home for 1 or more weeks. TREATMENT Treating high blood pressure includes making lifestyle changes and possibly taking medicine. Living a healthy lifestyle can help lower high blood pressure. You may need to change some of your habits. Lifestyle changes may include:  Following the DASH diet. This diet is high in fruits, vegetables, and whole grains. It is low in salt, red meat, and added sugars.  Keep your sodium intake below 2,300 mg per day.  Getting at least 30-45 minutes of aerobic exercise at least 4 times per week.  Losing weight if necessary.  Not smoking.  Limiting alcoholic beverages.  Learning ways to reduce stress. Your health care provider may prescribe medicine if lifestyle changes are not enough to get your blood pressure under control, and if one of the following is true:  You are 12-56 years of age and your systolic blood pressure is above 140.  You are 70 years of age or older, and your systolic blood pressure is above 150.  Your diastolic blood pressure is above 90.  You have diabetes, and your systolic blood pressure is over [id number] or your diastolic blood pressure is over 90.  You have kidney disease and your blood pressure is above 140/90.  You have heart disease and your blood pressure is above  140/90. Your personal target blood pressure may vary depending on your medical conditions, your age, and other factors. HOME CARE INSTRUCTIONS  Have your blood pressure rechecked as directed by your health care provider.   Take medicines only as directed by your health care provider. Follow the directions carefully. Blood pressure medicines must be taken as prescribed.  The medicine does not work as well when you skip doses. Skipping doses also puts you at risk for problems.  Do not smoke.   Monitor your blood pressure at home as directed by your health care provider. SEEK MEDICAL CARE IF:   You think you are having a reaction to medicines taken.  You have recurrent headaches or feel dizzy.  You have swelling in your ankles.  You have trouble with your vision. SEEK IMMEDIATE MEDICAL CARE IF:  You develop a severe headache or confusion.  You have unusual weakness, numbness, or feel faint.  You have severe chest or abdominal pain.  You vomit repeatedly.  You have trouble breathing. MAKE SURE YOU:   Understand these instructions.  Will watch your condition.  Will get help right away if you are not doing well or get worse.   This information is not intended to replace advice given to you by your health care provider. Make sure you discuss any questions you have with your health care provider.   Document Released: 10/28/2005 Document Revised: 03/14/2015 Document Reviewed: 08/20/2013 Elsevier Interactive Patient Education [employer].

## 2016-06-10 NOTE — Progress Notes (Signed)
Pre visit review using our clinic review tool, if applicable. No additional management support is needed unless otherwise documented below in the visit note. 

## 2016-06-11 ENCOUNTER — Other Ambulatory Visit: Payer: Self-pay | Admitting: Family Medicine

## 2016-06-11 DIAGNOSIS — K625 Hemorrhage of anus and rectum: Secondary | ICD-10-CM

## 2016-06-11 DIAGNOSIS — D649 Anemia, unspecified: Secondary | ICD-10-CM

## 2016-06-14 DIAGNOSIS — H524 Presbyopia: Secondary | ICD-10-CM | POA: Diagnosis not present

## 2016-06-14 DIAGNOSIS — H35372 Puckering of macula, left eye: Secondary | ICD-10-CM | POA: Diagnosis not present

## 2016-06-14 DIAGNOSIS — H43822 Vitreomacular adhesion, left eye: Secondary | ICD-10-CM | POA: Diagnosis not present

## 2016-06-14 DIAGNOSIS — H5201 Hypermetropia, right eye: Secondary | ICD-10-CM | POA: Diagnosis not present

## 2016-06-14 DIAGNOSIS — H5212 Myopia, left eye: Secondary | ICD-10-CM | POA: Diagnosis not present

## 2016-06-14 DIAGNOSIS — H52223 Regular astigmatism, bilateral: Secondary | ICD-10-CM | POA: Diagnosis not present

## 2016-06-14 DIAGNOSIS — H5210 Myopia, unspecified eye: Secondary | ICD-10-CM | POA: Diagnosis not present

## 2016-06-18 ENCOUNTER — Other Ambulatory Visit (INDEPENDENT_AMBULATORY_CARE_PROVIDER_SITE_OTHER): Payer: Commercial Managed Care - HMO

## 2016-06-18 DIAGNOSIS — K625 Hemorrhage of anus and rectum: Secondary | ICD-10-CM

## 2016-06-18 LAB — FECAL OCCULT BLOOD, IMMUNOCHEMICAL: FECAL OCCULT BLD: NEGATIVE

## 2016-06-19 ENCOUNTER — Encounter: Payer: Self-pay | Admitting: Family Medicine

## 2016-06-19 DIAGNOSIS — M79675 Pain in left toe(s): Secondary | ICD-10-CM | POA: Insufficient documentation

## 2016-06-19 NOTE — Assessment & Plan Note (Signed)
Hemoccult negative. Increase leafy greens, consider increased lean red meat and using cast iron cookware. Continue to monitor, report any concerns
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
## 2016-06-19 NOTE — Assessment & Plan Note (Signed)
Avoid offending foods, take probiotics. Do not eat large meals in late evening and consider raising head of bed. Ranitidine prn

## 2016-06-19 NOTE — Assessment & Plan Note (Signed)
RRR today 

## 2016-06-19 NOTE — Assessment & Plan Note (Signed)
Uric acid is normal. Encouraged topical NSAID and report if no improvement

## 2016-06-19 NOTE — Assessment & Plan Note (Signed)
Well controlled, no changes to meds. Encouraged heart healthy diet such as the DASH diet and exercise as tolerated.  °
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
## 2016-06-19 NOTE — Assessment & Plan Note (Signed)
Will try Paroxetine daily due to ongoing stressors.

## 2016-07-05 ENCOUNTER — Other Ambulatory Visit (INDEPENDENT_AMBULATORY_CARE_PROVIDER_SITE_OTHER): Payer: Commercial Managed Care - HMO

## 2016-07-05 DIAGNOSIS — D649 Anemia, unspecified: Secondary | ICD-10-CM

## 2016-07-05 LAB — CBC WITH DIFFERENTIAL/PLATELET
BASOS PCT: 0.7 % (ref 0.0–3.0)
Basophils Absolute: 0.1 10*3/uL (ref 0.0–0.1)
EOS PCT: 2.2 % (ref 0.0–5.0)
Eosinophils Absolute: 0.2 10*3/uL (ref 0.0–0.7)
HCT: 35 % — ABNORMAL LOW (ref 36.0–46.0)
Hemoglobin: 11.7 g/dL — ABNORMAL LOW (ref 12.0–15.0)
LYMPHS ABS: 4.1 10*3/uL — AB (ref 0.7–4.0)
Lymphocytes Relative: 44.3 % (ref 12.0–46.0)
MCHC: 33.6 g/dL (ref 30.0–36.0)
MCV: 85.5 fl (ref 78.0–100.0)
MONOS PCT: 6.1 % (ref 3.0–12.0)
Monocytes Absolute: 0.6 10*3/uL (ref 0.1–1.0)
NEUTROS ABS: 4.4 10*3/uL (ref 1.4–7.7)
NEUTROS PCT: 46.7 % (ref 43.0–77.0)
Platelets: 233 10*3/uL (ref 150.0–400.0)
RBC: 4.09 Mil/uL (ref 3.87–5.11)
RDW: 13.6 % (ref 11.5–15.5)
WBC: 9.4 10*3/uL (ref 4.0–10.5)

## 2016-08-26 ENCOUNTER — Ambulatory Visit (INDEPENDENT_AMBULATORY_CARE_PROVIDER_SITE_OTHER): Payer: Commercial Managed Care - HMO | Admitting: Medical

## 2016-08-26 ENCOUNTER — Encounter: Payer: Self-pay | Admitting: Medical

## 2016-08-26 VITALS — BP 148/67 | HR 71 | Temp 98.2°F | Ht 63.0 in | Wt 132.4 lb

## 2016-08-26 DIAGNOSIS — S51851A Open bite of right forearm, initial encounter: Secondary | ICD-10-CM | POA: Diagnosis not present

## 2016-08-26 DIAGNOSIS — W5501XA Bitten by cat, initial encounter: Secondary | ICD-10-CM

## 2016-08-26 DIAGNOSIS — L089 Local infection of the skin and subcutaneous tissue, unspecified: Secondary | ICD-10-CM | POA: Diagnosis not present

## 2016-08-26 MED ORDER — AMOXICILLIN-POT CLAVULANATE 875-125 MG PO TABS: 1.0000 | ORAL_TABLET | Freq: Two times a day (BID) | ORAL | 0 refills | Status: DC

## 2016-08-26 NOTE — Progress Notes (Signed)
Pre visit review using our clinic tool,if applicable. No additional management support is needed unless otherwise documented below in the visit note.  

## 2016-08-26 NOTE — Progress Notes (Signed)
Subjective:    Patient ID: Karina [name], female    DOB: 10-20-1933, 82 y.o.   MRN: [id number]  HPI  Pt in with report of cat bite yesterday. Her cat was fighting with grandson dog. Pt picked up the cat and then cat bit her. Note dog did not bite pt.  Pt cat up to date on vaccinations(particularly rabies. Cat due for that in 2018 August). Pt has records. Pt is up to date on her tetanus.   Over last 24 hours rapid redness and tenderness to cat bite area.    Review of Systems  Constitutional: Negative for chills, fatigue and fever.  HENT: Negative for congestion and drooling.   Respiratory: Negative for cough, chest tightness, shortness of breath and wheezing.   Cardiovascular: Negative for chest pain and palpitations.  Musculoskeletal: Negative for back pain.  Skin:       Rt forearm cat bit. Lt second digit cat bit.  Neurological: Negative for dizziness, syncope, facial asymmetry, speech difficulty, weakness and headaches.  Hematological: Negative for adenopathy. Does not bruise/bleed easily.    Past Medical History:  Diagnosis Date  . Anemia    iron deficiency  . ANEMIA, CHRONIC 08/13/2006  . Arthritis    left knee  . Atrophic vaginitis 10/03/2013  . CHICKENPOX, HX OF 01/28/2011  . CONSTIPATION 01/28/2011  . Depression with anxiety 03/22/12   Husband, [name] died in 08/23/11 after 48 years of marriage  Lost a 37 year old daughter to cancer   . DIVERTICULOSIS, COLON 01/28/2011  . Dyslipidemia   . Ejection fraction    EF 55%, echo, 2007  . Endometriosis   . Fatigue 1/11   w/ exercise.. responded to increased beta blocker dose  . GERD 12/28/2007  . GERD (gastroesophageal reflux disease)   . Grief reaction 2012-03-22  . HTN (hypertension) 03/06/2011  . Hypercholesteremia 2012/03/22  . HYPERLIPIDEMIA 12/17/2007  . Lesion of external ear 03/06/2011  . Lichen sclerosus et atrophicus of the vulva 22-Aug-2013  . MEASLES, HX OF 01/28/2011  . Medicare annual wellness visit,  subsequent 05/09/2014  . Normal cardiac stress test    Nuclear, 2004, normal  . OAB (overactive bladder) 12/17/2015  . Osteoporosis   . OSTEOPOROSIS 08/13/2006  . Other specified cardiac dysrhythmias(427.89) 09/17/2010  . Post menopausal problems 03/06/2011  . Preventative health care 12/17/2015  . Sinus tachycardia    mild-- retesting  . Sinusitis 12/16/2011  . Sinusitis, acute 11/02/2013  . SVT (supraventricular tachycardia) ([place]) 2007   3-4 beats.. Holter     Social History   Social History  . Marital status: Married    Spouse name: N/A  . Number of children: N/A  . Years of education: N/A   Occupational History  . Not on file.   Social History Main Topics  . Smoking status: Never Smoker  . Smokeless tobacco: Never Used  . Alcohol use No  . Drug use: No  . Sexual activity: No     Comment: TAH--Poss. left ovary remains   Other Topics Concern  . Not on file   Social History Narrative  . No narrative on file    Past Surgical History:  Procedure Laterality Date  . ABDOMINAL HYSTERECTOMY     w/ unilateral oophorectomy-bleeding, ovarian cysts, left ovary left in place?  Marland Kitchen BREAST SURGERY     Implants--Silicone  . right knee arthroscopy, cleaned      Family History  Problem Relation Age of Onset  . Stroke Mother   .  Hypertension Mother   . Other Mother     CHF  . Transient ischemic attack Mother   . Cancer Mother 65    cervical  . Cancer Father     stomach, smoker  . Cancer Sister     brain  . Lymphoma Brother   . Cancer Brother   . Stroke Daughter     ? stroke  . Mitral valve prolapse Daughter   . Irritable bowel syndrome Daughter   . Migraines Daughter   . Hypertension Son   . Cancer Paternal Aunt     cancer  . Cancer Paternal Uncle     X 2 CA  . Heart attack Paternal Uncle   . Other Maternal Grandmother     CHF  . Alcohol abuse Brother   . Heart attack Brother     smoker    No Known Allergies  Current Outpatient Prescriptions on File Prior to  Visit  Medication Sig Dispense Refill  . ALPRAZolam (XANAX) 0.5 MG tablet Take 1 tablet (0.5 mg total) by mouth as needed for sleep or anxiety. 1/2 to 2 tabs po bid prn for anxiety, insomnia 90 tablet 0  . aspirin 81 MG tablet Take 81 mg by mouth daily.      Marland Kitchen BIOTIN PO Take 1 tablet by mouth daily.    . Calcium Carbonate-Vitamin D (CALTRATE 600+D) 600-400 MG-UNIT per tablet Take 1 tablet by mouth 2 (two) times daily.      . Cholecalciferol (VITAMIN D) 1000 UNITS capsule Take 1,000 Units by mouth daily.      . clobetasol ointment (TEMOVATE) [id number] % Apply 1 application topically 2 (two) times daily. Use for one month only. Apply thin layer. 30 g 1  . co-enzyme Q-10 30 MG capsule Take 30 mg by mouth at bedtime.     . conjugated estrogens (PREMARIN) vaginal cream Place 1 Applicatorful vaginally. 1/4 gram per vagina once weekly.    Marland Kitchen diltiazem (CARDIZEM) 120 MG tablet Take 1 tablet (120 mg total) by mouth daily. 90 tablet 2  . diphenhydramine-acetaminophen (TYLENOL PM) 25-500 MG TABS Take 1 tablet by mouth at bedtime as needed (pain/sleep.).     Marland Kitchen Lutein 6 MG CAPS Take 1 capsule by mouth daily.      . metoprolol succinate (TOPROL-XL) 50 MG 24 hr tablet Take 1 tablet (50 mg total) by mouth daily. 90 tablet 2  . oxybutynin (DITROPAN) 5 MG tablet Take 0.5 tablets (2.5 mg total) by mouth 2 (two) times daily. 90 tablet 1  . PARoxetine (PAXIL) 10 MG tablet Take 1 tablet (10 mg total) by mouth daily. 90 tablet 1  . polyethylene glycol powder (MIRALAX) powder Take 17 g by mouth daily as needed. 255 g 0  . ranitidine (ZANTAC) 150 MG tablet Take 1 tablet (150 mg total) by mouth at bedtime as needed for heartburn. 90 tablet 1  . simvastatin (ZOCOR) 10 MG tablet TAKE 1 TABLET (10 MG TOTAL) BY MOUTH DAILY AT 6 PM. 90 tablet 1   No current facility-administered medications on file prior to visit.     Pulse 71   Temp 98.2 F (36.8 C) (Oral)   Ht 5\' 3"  (1.6 m)   Wt 132 lb 6.4 oz (60.1 kg)   SpO2 99%   BMI  23.45 kg/m       Objective:   Physical Exam  General- No acute distress. Pleasant patient.  Lungs- Clear, even and unlabored. Heart- regular rate and rhythm. Neurologic- CNII- XII grossly  intact.  Rt forearm- 3 inch  area around distal forearm appears red, warm and tender. 1 bite puncture wound ventral forearm. 2 on dorsal aspect.   Lt hand- 2nd digit small scratch but no redness.      Assessment & Plan:  For cat bite with secondary  skin infection will rx augmentin.  The area should gradually decrease in size in size but if worsening or expanding then would advise ED evaluation since in that event may need iv antibiotic.  Follow up in 2 days or as needed.  Under circumstances of  cat vaccinated and dog just attacking cat. Did not contact animal control.  Danija Gosa, Percell Miller, PA-C

## 2016-08-26 NOTE — Patient Instructions (Addendum)
For cat bite with secondary  skin infection will rx augmentin.  The area should gradually decrease in size in size but if worsening or expanding then would advise ED evaluation since in that event may need iv antibiotic.  Follow up in 2 days or as needed.

## 2016-08-28 ENCOUNTER — Encounter: Payer: Self-pay | Admitting: Medical

## 2016-08-28 ENCOUNTER — Ambulatory Visit (INDEPENDENT_AMBULATORY_CARE_PROVIDER_SITE_OTHER): Payer: Commercial Managed Care - HMO | Admitting: Medical

## 2016-08-28 VITALS — BP 124/80 | HR 65 | Temp 98.0°F | Ht 63.0 in | Wt 132.6 lb

## 2016-08-28 DIAGNOSIS — L089 Local infection of the skin and subcutaneous tissue, unspecified: Secondary | ICD-10-CM | POA: Diagnosis not present

## 2016-08-28 NOTE — Patient Instructions (Signed)
Your cat bite area and infection looks much better than the other day. The infection appears to be resolving. Continue the antibiotics. If you have any recurrence of worsening infection please let us know.   Follow up as needed at this point.

## 2016-08-28 NOTE — Progress Notes (Signed)
Subjective:    Patient ID: Karina [name], female    DOB: Jan 20, 1933, 82 y.o.   MRN: [id number]  HPI  Pt in for follow up. No fever, no chills, and no sweats. Pt prior red area on her forearm is almost normal color. Was bright red the other day. Pt is on augmentin.  Last night had some slight  pinknessto forearm but not the case now.   Review of Systems  Constitutional: Negative for chills, fatigue and fever.  Respiratory: Negative for cough, chest tightness, shortness of breath and wheezing.   Cardiovascular: Negative for chest pain and palpitations.  Gastrointestinal: Negative for abdominal pain.  Musculoskeletal: Negative for back pain.  Neurological: Negative for dizziness, numbness and headaches.  Hematological: Negative for adenopathy. Does not bruise/bleed easily.  Psychiatric/Behavioral: Negative for behavioral problems and confusion.    Past Medical History:  Diagnosis Date  . Anemia    iron deficiency  . ANEMIA, CHRONIC 08/13/2006  . Arthritis    left knee  . Atrophic vaginitis 10/03/2013  . CHICKENPOX, HX OF 01/28/2011  . CONSTIPATION 01/28/2011  . Depression with anxiety 2012/03/31   Husband, [name] died in 09-01-11 after 81 years of marriage  Lost a 44 year old daughter to cancer   . DIVERTICULOSIS, COLON 01/28/2011  . Dyslipidemia   . Ejection fraction    EF 55%, echo, 2007  . Endometriosis   . Fatigue 1/11   w/ exercise.. responded to increased beta blocker dose  . GERD 12/28/2007  . GERD (gastroesophageal reflux disease)   . Grief reaction 03-31-2012  . HTN (hypertension) 03/06/2011  . Hypercholesteremia 03/31/12  . HYPERLIPIDEMIA 12/17/2007  . Lesion of external ear 03/06/2011  . Lichen sclerosus et atrophicus of the vulva 08/31/13  . MEASLES, HX OF 01/28/2011  . Medicare annual wellness visit, subsequent 05/09/2014  . Normal cardiac stress test    Nuclear, 2004, normal  . OAB (overactive bladder) 12/17/2015  . Osteoporosis   . OSTEOPOROSIS  08/13/2006  . Other specified cardiac dysrhythmias(427.89) 09/17/2010  . Post menopausal problems 03/06/2011  . Preventative health care 12/17/2015  . Sinus tachycardia    mild-- retesting  . Sinusitis 12/16/2011  . Sinusitis, acute 11/02/2013  . SVT (supraventricular tachycardia) ([place]) 2007   3-4 beats.. Holter     Social History   Social History  . Marital status: Married    Spouse name: N/A  . Number of children: N/A  . Years of education: N/A   Occupational History  . Not on file.   Social History Main Topics  . Smoking status: Never Smoker  . Smokeless tobacco: Never Used  . Alcohol use No  . Drug use: No  . Sexual activity: No     Comment: TAH--Poss. left ovary remains   Other Topics Concern  . Not on file   Social History Narrative  . No narrative on file    Past Surgical History:  Procedure Laterality Date  . ABDOMINAL HYSTERECTOMY     w/ unilateral oophorectomy-bleeding, ovarian cysts, left ovary left in place?  Marland Kitchen BREAST SURGERY     Implants--Silicone  . right knee arthroscopy, cleaned      Family History  Problem Relation Age of Onset  . Stroke Mother   . Hypertension Mother   . Other Mother     CHF  . Transient ischemic attack Mother   . Cancer Mother 31    cervical  . Cancer Father     stomach, smoker  .  Cancer Sister     brain  . Lymphoma Brother   . Cancer Brother   . Stroke Daughter     ? stroke  . Mitral valve prolapse Daughter   . Irritable bowel syndrome Daughter   . Migraines Daughter   . Hypertension Son   . Cancer Paternal Aunt     cancer  . Cancer Paternal Uncle     X 2 CA  . Heart attack Paternal Uncle   . Other Maternal Grandmother     CHF  . Alcohol abuse Brother   . Heart attack Brother     smoker    No Known Allergies  Current Outpatient Prescriptions on File Prior to Visit  Medication Sig Dispense Refill  . ALPRAZolam (XANAX) 0.5 MG tablet Take 1 tablet (0.5 mg total) by mouth as needed for sleep or anxiety. 1/2  to 2 tabs po bid prn for anxiety, insomnia 90 tablet 0  . amoxicillin-clavulanate (AUGMENTIN) 875-125 MG tablet Take 1 tablet by mouth 2 (two) times daily. 20 tablet 0  . aspirin 81 MG tablet Take 81 mg by mouth daily.      Marland Kitchen BIOTIN PO Take 1 tablet by mouth daily.    . Calcium Carbonate-Vitamin D (CALTRATE 600+D) 600-400 MG-UNIT per tablet Take 1 tablet by mouth 2 (two) times daily.      . clobetasol ointment (TEMOVATE) AB-123456789 % Apply 1 application topically 2 (two) times daily. Use for one month only. Apply thin layer. 30 g 1  . co-enzyme Q-10 30 MG capsule Take 30 mg by mouth at bedtime.     . conjugated estrogens (PREMARIN) vaginal cream Place 1 Applicatorful vaginally. 1/4 gram per vagina once weekly.    Marland Kitchen diltiazem (CARDIZEM) 120 MG tablet Take 1 tablet (120 mg total) by mouth daily. 90 tablet 2  . diphenhydramine-acetaminophen (TYLENOL PM) 25-500 MG TABS Take 1 tablet by mouth at bedtime as needed (pain/sleep.).     Marland Kitchen KRILL OIL PO Take by mouth. Patient takes 1 tablet daily not sure of dosage    . Lutein 6 MG CAPS Take 1 capsule by mouth daily.      . metoprolol succinate (TOPROL-XL) 50 MG 24 hr tablet Take 1 tablet (50 mg total) by mouth daily. 90 tablet 2  . oxybutynin (DITROPAN) 5 MG tablet Take 0.5 tablets (2.5 mg total) by mouth 2 (two) times daily. 90 tablet 1  . PARoxetine (PAXIL) 10 MG tablet Take 1 tablet (10 mg total) by mouth daily. 90 tablet 1  . polyethylene glycol powder (MIRALAX) powder Take 17 g by mouth daily as needed. 255 g 0  . ranitidine (ZANTAC) 150 MG tablet Take 1 tablet (150 mg total) by mouth at bedtime as needed for heartburn. 90 tablet 1  . simvastatin (ZOCOR) 10 MG tablet TAKE 1 TABLET (10 MG TOTAL) BY MOUTH DAILY AT 6 PM. 90 tablet 1   No current facility-administered medications on file prior to visit.     BP 124/80   Pulse 65   Temp 98 F (36.7 C) (Oral)   Ht 5\' 3"  (1.6 m)   Wt 132 lb 9.6 oz (60.1 kg)   SpO2 97%   BMI 23.49 kg/m       Objective:     Physical Exam  General- No acute distress. Pleasant patient.    Rt forearm- Prior 3  inch area around distal forearm appears has cleared,  Not warm or tender. 1 bite puncture wound ventral forearm. 2 on dorsal  aspect.   Lt hand- 2nd digit small scratch but no redness.      Assessment & Plan:  Your cat bite area and infection looks much better than the other day. The infection appears to be resolving. Continue the antibiotics. If you have any recurrence of worsening infection please let us know.   Follow up as needed at this point.  Destony Prevost, Percell Miller, PA-C

## 2016-08-28 NOTE — Progress Notes (Signed)
Pre visit review using our clinic review tool, if applicable. No additional management support is needed unless otherwise documented below in the visit note./hsm  

## 2016-09-23 ENCOUNTER — Telehealth: Payer: Self-pay | Admitting: Family Medicine

## 2016-09-24 ENCOUNTER — Ambulatory Visit (INDEPENDENT_AMBULATORY_CARE_PROVIDER_SITE_OTHER): Payer: Commercial Managed Care - HMO

## 2016-09-24 DIAGNOSIS — Z23 Encounter for immunization: Secondary | ICD-10-CM

## 2016-10-07 MED ORDER — SIMVASTATIN 10 MG PO TABS: 10.0000 mg | ORAL_TABLET | Freq: Every day | ORAL | 1 refills | Status: DC

## 2016-10-07 NOTE — Telephone Encounter (Signed)
Sent in mail order and did cancel local pharmacy refills.

## 2016-10-07 NOTE — Telephone Encounter (Signed)
Caller name: Saby Relationship to patient: self Can be reached: [phone] Pharmacy: [facility], [place]  Reason for call: pt called stating that her insurance mailed letter that she needs to have maintenance meds filled thru mail order in 90 day supply. Please cancel order at for simvastatin at [facility] that was sent 11/13 (pt did not pick up) and resend to mail order [facility]).

## 2016-10-07 NOTE — Addendum Note (Signed)
Addended by: Sharon Seller B on: 10/07/2016 02:17 PM   Modules accepted: Orders

## 2016-11-08 DIAGNOSIS — R110 Nausea: Secondary | ICD-10-CM | POA: Diagnosis not present

## 2016-11-08 DIAGNOSIS — J189 Pneumonia, unspecified organism: Secondary | ICD-10-CM | POA: Diagnosis not present

## 2016-11-08 DIAGNOSIS — R112 Nausea with vomiting, unspecified: Secondary | ICD-10-CM | POA: Diagnosis not present

## 2016-11-08 DIAGNOSIS — R05 Cough: Secondary | ICD-10-CM | POA: Diagnosis not present

## 2016-11-08 DIAGNOSIS — R42 Dizziness and giddiness: Secondary | ICD-10-CM | POA: Diagnosis not present

## 2016-11-12 ENCOUNTER — Telehealth: Payer: Self-pay | Admitting: Family Medicine

## 2016-11-12 NOTE — Telephone Encounter (Signed)
Caller name: Yeva Obarr Relation to pt: granddaughter Call back number: [phone] Pharmacy:  Reason for call: pt's granddaughter would like a call back asap, pt is in [place] with granddaugter and pt got pneumonia while there, pt has not been able to fly back home yet due to illness, grand daughter would like to speak asap has questions on pt's meds.

## 2016-11-12 NOTE — Telephone Encounter (Signed)
The granddaughter states that her grandmother (our patient) went to the ER (in [place]) and was diagnoses with Pneumonia.  She was not Admitted.  Was prescribed zofran, tessalon perles and azithromycin 250 mg.  Has completed the antibiotics. Entire family has been sick. States patient is having no fever, still coughing, staying well hydrated, not eating well, very lethargic/weak. The patient has not been able to get back to  yet.  I did inform the granddaughter she should get her in to see a MD there in [place] due to current symptoms before flying her back to [place]. Advise please.

## 2016-11-12 NOTE — Telephone Encounter (Signed)
Granddaughter informed of Instructions.  She agreed to do so asap.

## 2016-11-12 NOTE — Telephone Encounter (Signed)
If she is still sick and weak she may need to be admitted--- she should go back to er

## 2016-11-15 DIAGNOSIS — R911 Solitary pulmonary nodule: Secondary | ICD-10-CM | POA: Diagnosis not present

## 2016-11-15 DIAGNOSIS — J439 Emphysema, unspecified: Secondary | ICD-10-CM | POA: Diagnosis not present

## 2016-11-15 DIAGNOSIS — J189 Pneumonia, unspecified organism: Secondary | ICD-10-CM | POA: Diagnosis not present

## 2016-11-15 DIAGNOSIS — R079 Chest pain, unspecified: Secondary | ICD-10-CM | POA: Diagnosis not present

## 2016-11-15 DIAGNOSIS — R0600 Dyspnea, unspecified: Secondary | ICD-10-CM | POA: Diagnosis not present

## 2016-11-15 DIAGNOSIS — R05 Cough: Secondary | ICD-10-CM | POA: Diagnosis not present

## 2016-12-04 DIAGNOSIS — E784 Other hyperlipidemia: Secondary | ICD-10-CM | POA: Diagnosis not present

## 2016-12-04 DIAGNOSIS — I10 Essential (primary) hypertension: Secondary | ICD-10-CM | POA: Diagnosis not present

## 2016-12-04 DIAGNOSIS — Z8679 Personal history of other diseases of the circulatory system: Secondary | ICD-10-CM | POA: Diagnosis not present

## 2016-12-04 DIAGNOSIS — Z1389 Encounter for screening for other disorder: Secondary | ICD-10-CM | POA: Diagnosis not present

## 2016-12-04 DIAGNOSIS — F419 Anxiety disorder, unspecified: Secondary | ICD-10-CM | POA: Diagnosis not present

## 2016-12-04 DIAGNOSIS — N951 Menopausal and female climacteric states: Secondary | ICD-10-CM | POA: Diagnosis not present

## 2016-12-04 DIAGNOSIS — K219 Gastro-esophageal reflux disease without esophagitis: Secondary | ICD-10-CM | POA: Diagnosis not present

## 2016-12-04 DIAGNOSIS — Z6822 Body mass index (BMI) 22.0-22.9, adult: Secondary | ICD-10-CM | POA: Diagnosis not present

## 2016-12-13 ENCOUNTER — Encounter: Payer: Commercial Managed Care - HMO | Admitting: Family Medicine

## 2016-12-16 DIAGNOSIS — Z1231 Encounter for screening mammogram for malignant neoplasm of breast: Secondary | ICD-10-CM | POA: Diagnosis not present

## 2016-12-16 DIAGNOSIS — I10 Essential (primary) hypertension: Secondary | ICD-10-CM | POA: Diagnosis not present

## 2016-12-16 DIAGNOSIS — E784 Other hyperlipidemia: Secondary | ICD-10-CM | POA: Diagnosis not present

## 2016-12-16 DIAGNOSIS — R8290 Unspecified abnormal findings in urine: Secondary | ICD-10-CM | POA: Diagnosis not present

## 2016-12-16 DIAGNOSIS — M8589 Other specified disorders of bone density and structure, multiple sites: Secondary | ICD-10-CM | POA: Diagnosis not present

## 2016-12-16 DIAGNOSIS — M810 Age-related osteoporosis without current pathological fracture: Secondary | ICD-10-CM | POA: Diagnosis not present

## 2016-12-23 DIAGNOSIS — E784 Other hyperlipidemia: Secondary | ICD-10-CM | POA: Diagnosis not present

## 2016-12-23 DIAGNOSIS — I10 Essential (primary) hypertension: Secondary | ICD-10-CM | POA: Diagnosis not present

## 2016-12-23 DIAGNOSIS — Z23 Encounter for immunization: Secondary | ICD-10-CM | POA: Diagnosis not present

## 2016-12-23 DIAGNOSIS — J329 Chronic sinusitis, unspecified: Secondary | ICD-10-CM | POA: Diagnosis not present

## 2016-12-23 DIAGNOSIS — N951 Menopausal and female climacteric states: Secondary | ICD-10-CM | POA: Diagnosis not present

## 2016-12-23 DIAGNOSIS — F419 Anxiety disorder, unspecified: Secondary | ICD-10-CM | POA: Diagnosis not present

## 2016-12-23 DIAGNOSIS — E038 Other specified hypothyroidism: Secondary | ICD-10-CM | POA: Diagnosis not present

## 2016-12-23 DIAGNOSIS — Z6822 Body mass index (BMI) 22.0-22.9, adult: Secondary | ICD-10-CM | POA: Diagnosis not present

## 2016-12-23 DIAGNOSIS — Z0000 Encounter for general adult medical examination without abnormal findings: Secondary | ICD-10-CM | POA: Diagnosis not present

## 2016-12-25 DIAGNOSIS — Z1212 Encounter for screening for malignant neoplasm of rectum: Secondary | ICD-10-CM | POA: Diagnosis not present

## 2016-12-27 ENCOUNTER — Encounter: Payer: Self-pay | Admitting: *Deleted

## 2017-01-06 ENCOUNTER — Encounter: Payer: Self-pay | Admitting: Cardiology

## 2017-01-06 ENCOUNTER — Ambulatory Visit (INDEPENDENT_AMBULATORY_CARE_PROVIDER_SITE_OTHER): Payer: Medicare HMO | Admitting: Cardiology

## 2017-01-06 ENCOUNTER — Encounter (INDEPENDENT_AMBULATORY_CARE_PROVIDER_SITE_OTHER): Payer: Self-pay

## 2017-01-06 VITALS — BP 136/78 | HR 69 | Ht 63.0 in | Wt 129.8 lb

## 2017-01-06 DIAGNOSIS — R002 Palpitations: Secondary | ICD-10-CM | POA: Diagnosis not present

## 2017-01-06 DIAGNOSIS — I471 Supraventricular tachycardia: Secondary | ICD-10-CM | POA: Diagnosis not present

## 2017-01-06 NOTE — Progress Notes (Signed)
Cardiology Office Note    Date:  01/06/2017   ID:  [name], [name] [birth year], MRN [id number]  PCP:  Geoffery Lyons, MD  Cardiologist:   Candee Furbish, MD (Former Dr. Ron [name])    History of Present Illness:  Karina [name] is a 83 y.o. female former patient of Dr. Ron [name] with history of palpitations, one brief episode of supraventricular tachycardia documented on a Holter monitor with no documented atrial fibrillation. Rare intermittent palpitations. Has not had any for a while.   She had 4 beats of supraventricular tachycardia on one Holter monitor. No reason for anticoagulation.  Bronchitis 10/17. PNA in [place] as well.   Doing well, no shortness of breath, no chest pain. No syncope, no bleeding.    Past Medical History:  Diagnosis Date  . Anemia    iron deficiency  . ANEMIA, CHRONIC 08/13/2006  . Arthritis    left knee  . Atrophic vaginitis 10/03/2013  . CHICKENPOX, HX OF 01/28/2011  . CONSTIPATION 01/28/2011  . Depression with anxiety 04/05/12   Husband, [name] died in Sep 06, 2011 after 43 years of marriage  Lost a 29 year old daughter to cancer   . DIVERTICULOSIS, COLON 01/28/2011  . Dyslipidemia   . Ejection fraction    EF 55%, echo, 2007  . Endometriosis   . Fatigue 1/11   w/ exercise.. responded to increased beta blocker dose  . GERD 12/28/2007  . GERD (gastroesophageal reflux disease)   . Grief reaction 04-05-12  . HTN (hypertension) 03/06/2011  . Hypercholesteremia Apr 05, 2012  . HYPERLIPIDEMIA 12/17/2007  . Lesion of external ear 03/06/2011  . Lichen sclerosus et atrophicus of the vulva 09/05/13  . MEASLES, HX OF 01/28/2011  . Medicare annual wellness visit, subsequent 05/09/2014  . Normal cardiac stress test    Nuclear, 2004, normal  . OAB (overactive bladder) 12/17/2015  . Osteoporosis   . OSTEOPOROSIS 08/13/2006  . Other specified cardiac dysrhythmias(427.89) 09/17/2010  . Post menopausal problems 03/06/2011  . Preventative health care 12/17/2015  .  Sinus tachycardia    mild-- retesting  . Sinusitis 12/16/2011  . Sinusitis, acute 11/02/2013  . SVT (supraventricular tachycardia) ([place]) 2007   3-4 beats.. Holter    Past Surgical History:  Procedure Laterality Date  . ABDOMINAL HYSTERECTOMY     w/ unilateral oophorectomy-bleeding, ovarian cysts, left ovary left in place?  Marland Kitchen BREAST SURGERY     Implants--Silicone  . right knee arthroscopy, cleaned      Outpatient Medications Prior to Visit  Medication Sig Dispense Refill  . ALPRAZolam (XANAX) 0.5 MG tablet Take 1 tablet (0.5 mg total) by mouth as needed for sleep or anxiety. 1/2 to 2 tabs po bid prn for anxiety, insomnia 90 tablet 0  . aspirin 81 MG tablet Take 81 mg by mouth daily.      Marland Kitchen BIOTIN PO Take 1 tablet by mouth daily.    Marland Kitchen co-enzyme Q-10 30 MG capsule Take 30 mg by mouth at bedtime.     . conjugated estrogens (PREMARIN) vaginal cream Place 1 Applicatorful vaginally. 1/4 gram per vagina once weekly.    Marland Kitchen diltiazem (CARDIZEM) 120 MG tablet Take 1 tablet (120 mg total) by mouth daily. 90 tablet 2  . diphenhydramine-acetaminophen (TYLENOL PM) 25-500 MG TABS Take 1 tablet by mouth at bedtime as needed (pain/sleep.).     Marland Kitchen metoprolol succinate (TOPROL-XL) 50 MG 24 hr tablet Take 1 tablet (50 mg total) by mouth daily. 90 tablet 2  . oxybutynin (DITROPAN) 5  MG tablet Take 0.5 tablets (2.5 mg total) by mouth 2 (two) times daily. 90 tablet 1  . simvastatin (ZOCOR) 10 MG tablet Take 1 tablet (10 mg total) by mouth daily at 6 PM. 90 tablet 1  . amoxicillin-clavulanate (AUGMENTIN) 875-125 MG tablet Take 1 tablet by mouth 2 (two) times daily. 20 tablet 0  . Calcium Carbonate-Vitamin D (CALTRATE 600+D) 600-400 MG-UNIT per tablet Take 1 tablet by mouth 2 (two) times daily.      . clobetasol ointment (TEMOVATE) AB-123456789 % Apply 1 application topically 2 (two) times daily. Use for one month only. Apply thin layer. 30 g 1  . KRILL OIL PO Take by mouth. Patient takes 1 tablet daily not sure of dosage     . Lutein 6 MG CAPS Take 1 capsule by mouth daily.      Marland Kitchen PARoxetine (PAXIL) 10 MG tablet Take 1 tablet (10 mg total) by mouth daily. 90 tablet 1  . polyethylene glycol powder (MIRALAX) powder Take 17 g by mouth daily as needed. 255 g 0  . ranitidine (ZANTAC) 150 MG tablet Take 1 tablet (150 mg total) by mouth at bedtime as needed for heartburn. 90 tablet 1   No facility-administered medications prior to visit.      Allergies:   Patient has no known allergies.   Social History   Social History  . Marital status: Married    Spouse name: N/A  . Number of children: N/A  . Years of education: N/A   Social History Main Topics  . Smoking status: Never Smoker  . Smokeless tobacco: Never Used  . Alcohol use No  . Drug use: No  . Sexual activity: No     Comment: TAH--Poss. left ovary remains   Other Topics Concern  . None   Social History Narrative  . None     Family History:  The patient's family history includes Alcohol abuse in her brother; Cancer in her brother, father, paternal aunt, paternal uncle, and sister; Cancer (age of onset: 48) in her mother; Heart attack in her brother and paternal uncle; Hypertension in her mother and son; Irritable bowel syndrome in her daughter; Lymphoma in her brother; Migraines in her daughter; Mitral valve prolapse in her daughter; Other in her maternal grandmother and mother; Stroke in her daughter and mother; Transient ischemic attack in her mother.   ROS:   Please see the history of present illness.    ROS All other systems reviewed and are negative.   PHYSICAL EXAM:   VS:  BP 136/78   Pulse 69   Ht 5\' 3"  (1.6 m)   Wt 129 lb 12.8 oz (58.9 kg)   SpO2 98%   BMI 22.99 kg/m    GEN: Well nourished, well developed, in no acute distress  HEENT: normal  Neck: no JVD, carotid bruits, or masses Cardiac: RRR; no murmurs, rubs, or gallops,no edema  Respiratory:  clear to auscultation bilaterally, normal work of breathing GI: soft,  nontender, nondistended, + BS MS: no deformity or atrophy  Skin: warm and dry, no rash Neuro:  Alert and Oriented x 3, Strength and sensation are intact Psych: euthymic mood, full affect  Wt Readings from Last 3 Encounters:  01/06/17 129 lb 12.8 oz (58.9 kg)  08/28/16 132 lb 9.6 oz (60.1 kg)  08/26/16 132 lb 6.4 oz (60.1 kg)      Studies/Labs Reviewed:   EKG:  EKG is not ordered today.   Recent Labs: 06/10/2016: ALT 21; BUN 19;  Creatinine, Ser 0.92; Potassium 4.3; Sodium 140; TSH 1.64 07/05/2016: Hemoglobin 11.7; Platelets 233.0   Lipid Panel    Component Value Date/Time   CHOL 206 (H) 06/10/2016 1405   TRIG 187.0 (H) 06/10/2016 1405   HDL 65.80 06/10/2016 1405   CHOLHDL 3 06/10/2016 1405   VLDL 37.4 06/10/2016 1405   LDLCALC 103 (H) 06/10/2016 1405   LDLDIRECT 156.5 03/10/2012 0921    Additional studies/ records that were reviewed today include:  Prior office notes reviewed, EKG reviewed, lab work reviewed    ASSESSMENT:    1. SVT (supraventricular tachycardia) ([facility])   2. Palpitations      PLAN:  In order of problems listed above:  1. PSVT-brief episode-continue with metoprolol, Diltiazem. Not atrial fibrillation. No anticoagulation needed. Overall doing well.  2. Palpitations-as above. Rarely experiencing these. She is now over bronchitis, pneumonia. 3. I'm comfortable seeing her back on an as-needed basis. Please let me know if I can be of further assistance.    Medication Adjustments/Labs and Tests Ordered: Current medicines are reviewed at length with the patient today.  Concerns regarding medicines are outlined above.  Medication changes, Labs and Tests ordered today are listed in the Patient Instructions below. Patient Instructions  Medication Instructions:  The current medical regimen is effective;  continue present plan and medications.  Follow-Up: Follow up as needed.  Thank you for choosing [facility]!!          Signed, Candee Furbish, MD  01/06/2017 2:51 PM    [address], [place], [place]  [address] Phone: [phone]; Fax: [phone]

## 2017-01-06 NOTE — Patient Instructions (Signed)
Medication Instructions:  The current medical regimen is effective;  continue present plan and medications.  Follow-Up: Follow up as needed.  Thank you for choosing [facility]!!     

## 2017-01-15 ENCOUNTER — Other Ambulatory Visit: Payer: Self-pay | Admitting: Cardiology

## 2017-02-12 DIAGNOSIS — G4700 Insomnia, unspecified: Secondary | ICD-10-CM | POA: Diagnosis not present

## 2017-02-12 DIAGNOSIS — E038 Other specified hypothyroidism: Secondary | ICD-10-CM | POA: Diagnosis not present

## 2017-02-12 DIAGNOSIS — Z6822 Body mass index (BMI) 22.0-22.9, adult: Secondary | ICD-10-CM | POA: Diagnosis not present

## 2017-03-03 DIAGNOSIS — R1310 Dysphagia, unspecified: Secondary | ICD-10-CM | POA: Diagnosis not present

## 2017-03-03 DIAGNOSIS — Z1211 Encounter for screening for malignant neoplasm of colon: Secondary | ICD-10-CM | POA: Diagnosis not present

## 2017-03-04 ENCOUNTER — Other Ambulatory Visit: Payer: Self-pay | Admitting: Cardiology

## 2017-06-19 DIAGNOSIS — E784 Other hyperlipidemia: Secondary | ICD-10-CM | POA: Diagnosis not present

## 2017-06-19 DIAGNOSIS — F419 Anxiety disorder, unspecified: Secondary | ICD-10-CM | POA: Diagnosis not present

## 2017-06-19 DIAGNOSIS — Z6823 Body mass index (BMI) 23.0-23.9, adult: Secondary | ICD-10-CM | POA: Diagnosis not present

## 2017-06-19 DIAGNOSIS — I10 Essential (primary) hypertension: Secondary | ICD-10-CM | POA: Diagnosis not present

## 2017-06-19 DIAGNOSIS — E038 Other specified hypothyroidism: Secondary | ICD-10-CM | POA: Diagnosis not present

## 2017-06-19 DIAGNOSIS — G4700 Insomnia, unspecified: Secondary | ICD-10-CM | POA: Diagnosis not present

## 2017-06-19 DIAGNOSIS — K219 Gastro-esophageal reflux disease without esophagitis: Secondary | ICD-10-CM | POA: Diagnosis not present

## 2017-06-19 DIAGNOSIS — L989 Disorder of the skin and subcutaneous tissue, unspecified: Secondary | ICD-10-CM | POA: Diagnosis not present

## 2017-07-23 DIAGNOSIS — H61021 Chronic perichondritis of right external ear: Secondary | ICD-10-CM | POA: Diagnosis not present

## 2017-07-23 DIAGNOSIS — L821 Other seborrheic keratosis: Secondary | ICD-10-CM | POA: Diagnosis not present

## 2017-07-23 DIAGNOSIS — L814 Other melanin hyperpigmentation: Secondary | ICD-10-CM | POA: Diagnosis not present

## 2017-07-23 DIAGNOSIS — D225 Melanocytic nevi of trunk: Secondary | ICD-10-CM | POA: Diagnosis not present

## 2017-09-06 DIAGNOSIS — Z23 Encounter for immunization: Secondary | ICD-10-CM | POA: Diagnosis not present

## 2017-09-29 DIAGNOSIS — E038 Other specified hypothyroidism: Secondary | ICD-10-CM | POA: Diagnosis not present

## 2017-10-08 DIAGNOSIS — E038 Other specified hypothyroidism: Secondary | ICD-10-CM | POA: Diagnosis not present

## 2017-10-08 DIAGNOSIS — Z6824 Body mass index (BMI) 24.0-24.9, adult: Secondary | ICD-10-CM | POA: Diagnosis not present

## 2017-12-18 ENCOUNTER — Encounter: Payer: Self-pay | Admitting: Gastroenterology

## 2017-12-18 DIAGNOSIS — Z1231 Encounter for screening mammogram for malignant neoplasm of breast: Secondary | ICD-10-CM | POA: Diagnosis not present

## 2018-01-05 DIAGNOSIS — I10 Essential (primary) hypertension: Secondary | ICD-10-CM | POA: Diagnosis not present

## 2018-01-05 DIAGNOSIS — R82998 Other abnormal findings in urine: Secondary | ICD-10-CM | POA: Diagnosis not present

## 2018-01-05 DIAGNOSIS — E038 Other specified hypothyroidism: Secondary | ICD-10-CM | POA: Diagnosis not present

## 2018-01-05 DIAGNOSIS — E7849 Other hyperlipidemia: Secondary | ICD-10-CM | POA: Diagnosis not present

## 2018-01-08 DIAGNOSIS — I10 Essential (primary) hypertension: Secondary | ICD-10-CM | POA: Diagnosis not present

## 2018-01-08 DIAGNOSIS — Z23 Encounter for immunization: Secondary | ICD-10-CM | POA: Diagnosis not present

## 2018-01-08 DIAGNOSIS — E7849 Other hyperlipidemia: Secondary | ICD-10-CM | POA: Diagnosis not present

## 2018-01-08 DIAGNOSIS — E038 Other specified hypothyroidism: Secondary | ICD-10-CM | POA: Diagnosis not present

## 2018-01-08 DIAGNOSIS — G4700 Insomnia, unspecified: Secondary | ICD-10-CM | POA: Diagnosis not present

## 2018-01-08 DIAGNOSIS — F419 Anxiety disorder, unspecified: Secondary | ICD-10-CM | POA: Diagnosis not present

## 2018-01-08 DIAGNOSIS — Z1389 Encounter for screening for other disorder: Secondary | ICD-10-CM | POA: Diagnosis not present

## 2018-01-08 DIAGNOSIS — Z6824 Body mass index (BMI) 24.0-24.9, adult: Secondary | ICD-10-CM | POA: Diagnosis not present

## 2018-01-08 DIAGNOSIS — Z0000 Encounter for general adult medical examination without abnormal findings: Secondary | ICD-10-CM | POA: Diagnosis not present

## 2018-01-14 DIAGNOSIS — Z1212 Encounter for screening for malignant neoplasm of rectum: Secondary | ICD-10-CM | POA: Diagnosis not present

## 2018-03-08 ENCOUNTER — Other Ambulatory Visit: Payer: Self-pay | Admitting: Cardiology

## 2018-05-29 DIAGNOSIS — M25561 Pain in right knee: Secondary | ICD-10-CM | POA: Diagnosis not present

## 2018-05-29 DIAGNOSIS — Z6824 Body mass index (BMI) 24.0-24.9, adult: Secondary | ICD-10-CM | POA: Diagnosis not present

## 2018-06-26 DIAGNOSIS — M1711 Unilateral primary osteoarthritis, right knee: Secondary | ICD-10-CM | POA: Diagnosis not present

## 2018-06-26 DIAGNOSIS — M25561 Pain in right knee: Secondary | ICD-10-CM | POA: Diagnosis not present

## 2018-06-26 DIAGNOSIS — M1712 Unilateral primary osteoarthritis, left knee: Secondary | ICD-10-CM | POA: Diagnosis not present

## 2018-06-26 DIAGNOSIS — M170 Bilateral primary osteoarthritis of knee: Secondary | ICD-10-CM | POA: Diagnosis not present

## 2018-08-11 DIAGNOSIS — Z0100 Encounter for examination of eyes and vision without abnormal findings: Secondary | ICD-10-CM | POA: Diagnosis not present

## 2018-08-11 DIAGNOSIS — H5203 Hypermetropia, bilateral: Secondary | ICD-10-CM | POA: Diagnosis not present

## 2018-08-11 DIAGNOSIS — H524 Presbyopia: Secondary | ICD-10-CM | POA: Diagnosis not present

## 2018-08-11 DIAGNOSIS — H52209 Unspecified astigmatism, unspecified eye: Secondary | ICD-10-CM | POA: Diagnosis not present

## 2018-08-11 DIAGNOSIS — H5213 Myopia, bilateral: Secondary | ICD-10-CM | POA: Diagnosis not present

## 2018-08-13 DIAGNOSIS — M1711 Unilateral primary osteoarthritis, right knee: Secondary | ICD-10-CM | POA: Diagnosis not present

## 2018-09-10 DIAGNOSIS — L853 Xerosis cutis: Secondary | ICD-10-CM | POA: Diagnosis not present

## 2018-09-10 DIAGNOSIS — Z8709 Personal history of other diseases of the respiratory system: Secondary | ICD-10-CM | POA: Diagnosis not present

## 2018-09-10 DIAGNOSIS — N951 Menopausal and female climacteric states: Secondary | ICD-10-CM | POA: Diagnosis not present

## 2018-09-10 DIAGNOSIS — E038 Other specified hypothyroidism: Secondary | ICD-10-CM | POA: Diagnosis not present

## 2018-09-10 DIAGNOSIS — K219 Gastro-esophageal reflux disease without esophagitis: Secondary | ICD-10-CM | POA: Diagnosis not present

## 2018-09-10 DIAGNOSIS — M40209 Unspecified kyphosis, site unspecified: Secondary | ICD-10-CM | POA: Diagnosis not present

## 2018-09-10 DIAGNOSIS — G4700 Insomnia, unspecified: Secondary | ICD-10-CM | POA: Diagnosis not present

## 2018-09-10 DIAGNOSIS — E7849 Other hyperlipidemia: Secondary | ICD-10-CM | POA: Diagnosis not present

## 2018-09-10 DIAGNOSIS — I10 Essential (primary) hypertension: Secondary | ICD-10-CM | POA: Diagnosis not present

## 2018-12-07 DIAGNOSIS — I10 Essential (primary) hypertension: Secondary | ICD-10-CM | POA: Diagnosis not present

## 2018-12-07 DIAGNOSIS — J029 Acute pharyngitis, unspecified: Secondary | ICD-10-CM | POA: Diagnosis not present

## 2018-12-07 DIAGNOSIS — J020 Streptococcal pharyngitis: Secondary | ICD-10-CM | POA: Diagnosis not present

## 2018-12-07 DIAGNOSIS — R52 Pain, unspecified: Secondary | ICD-10-CM | POA: Diagnosis not present

## 2018-12-07 DIAGNOSIS — Z6825 Body mass index (BMI) 25.0-25.9, adult: Secondary | ICD-10-CM | POA: Diagnosis not present

## 2018-12-21 DIAGNOSIS — Z1231 Encounter for screening mammogram for malignant neoplasm of breast: Secondary | ICD-10-CM | POA: Diagnosis not present

## 2018-12-21 DIAGNOSIS — M810 Age-related osteoporosis without current pathological fracture: Secondary | ICD-10-CM | POA: Diagnosis not present

## 2018-12-21 DIAGNOSIS — Z8262 Family history of osteoporosis: Secondary | ICD-10-CM | POA: Diagnosis not present

## 2018-12-21 DIAGNOSIS — Z90710 Acquired absence of both cervix and uterus: Secondary | ICD-10-CM | POA: Diagnosis not present

## 2018-12-23 DIAGNOSIS — Z6825 Body mass index (BMI) 25.0-25.9, adult: Secondary | ICD-10-CM | POA: Diagnosis not present

## 2018-12-23 DIAGNOSIS — J020 Streptococcal pharyngitis: Secondary | ICD-10-CM | POA: Diagnosis not present

## 2018-12-23 DIAGNOSIS — J029 Acute pharyngitis, unspecified: Secondary | ICD-10-CM | POA: Diagnosis not present

## 2019-02-16 DIAGNOSIS — M25531 Pain in right wrist: Secondary | ICD-10-CM | POA: Diagnosis not present

## 2019-02-16 DIAGNOSIS — I10 Essential (primary) hypertension: Secondary | ICD-10-CM | POA: Diagnosis not present

## 2019-02-16 DIAGNOSIS — M12831 Other specific arthropathies, not elsewhere classified, right wrist: Secondary | ICD-10-CM | POA: Diagnosis not present

## 2019-02-22 DIAGNOSIS — I10 Essential (primary) hypertension: Secondary | ICD-10-CM | POA: Diagnosis not present

## 2019-02-22 DIAGNOSIS — E7849 Other hyperlipidemia: Secondary | ICD-10-CM | POA: Diagnosis not present

## 2019-02-22 DIAGNOSIS — E038 Other specified hypothyroidism: Secondary | ICD-10-CM | POA: Diagnosis not present

## 2019-02-25 DIAGNOSIS — R82998 Other abnormal findings in urine: Secondary | ICD-10-CM | POA: Diagnosis not present

## 2019-02-25 DIAGNOSIS — I10 Essential (primary) hypertension: Secondary | ICD-10-CM | POA: Diagnosis not present

## 2019-03-01 DIAGNOSIS — M12831 Other specific arthropathies, not elsewhere classified, right wrist: Secondary | ICD-10-CM | POA: Diagnosis not present

## 2019-03-01 DIAGNOSIS — E785 Hyperlipidemia, unspecified: Secondary | ICD-10-CM | POA: Diagnosis not present

## 2019-03-01 DIAGNOSIS — Z1331 Encounter for screening for depression: Secondary | ICD-10-CM | POA: Diagnosis not present

## 2019-03-01 DIAGNOSIS — L853 Xerosis cutis: Secondary | ICD-10-CM | POA: Diagnosis not present

## 2019-03-01 DIAGNOSIS — E039 Hypothyroidism, unspecified: Secondary | ICD-10-CM | POA: Diagnosis not present

## 2019-03-01 DIAGNOSIS — M40209 Unspecified kyphosis, site unspecified: Secondary | ICD-10-CM | POA: Diagnosis not present

## 2019-03-01 DIAGNOSIS — Z0000 Encounter for general adult medical examination without abnormal findings: Secondary | ICD-10-CM | POA: Diagnosis not present

## 2019-03-01 DIAGNOSIS — M25531 Pain in right wrist: Secondary | ICD-10-CM | POA: Diagnosis not present

## 2019-03-01 DIAGNOSIS — I10 Essential (primary) hypertension: Secondary | ICD-10-CM | POA: Diagnosis not present

## 2019-03-01 DIAGNOSIS — Z1339 Encounter for screening examination for other mental health and behavioral disorders: Secondary | ICD-10-CM | POA: Diagnosis not present

## 2019-03-01 DIAGNOSIS — Z8709 Personal history of other diseases of the respiratory system: Secondary | ICD-10-CM | POA: Diagnosis not present

## 2019-04-30 DIAGNOSIS — W5501XA Bitten by cat, initial encounter: Secondary | ICD-10-CM | POA: Diagnosis not present

## 2019-04-30 DIAGNOSIS — S41131A Puncture wound without foreign body of right upper arm, initial encounter: Secondary | ICD-10-CM | POA: Diagnosis not present

## 2019-04-30 DIAGNOSIS — I10 Essential (primary) hypertension: Secondary | ICD-10-CM | POA: Diagnosis not present

## 2019-05-23 NOTE — Progress Notes (Signed)
Cardiology Office Note   Date:  05/24/2019   ID:  Karina [name], Karina [name] 1933-08-20, MRN [id number]  PCP:  Burnard Bunting, MD  Cardiologist: Dr. Marlou Porch, MD   Chief Complaint  Patient presents with  . Palpitations    History of Present Illness: Karina [name] is a 85 y.o. female former patient of Dr. Ron [name] who now follows with Dr. Marlou Porch, with history of palpitations, one brief episode of supraventricular tachycardia documented on a Holter monitor (4 beats) with no documented atrial fibrillation and no need for anticoagulation however I cannot find this report in Epic for my personal review. Per chart review, Holter was placed prior to 2012. Also had an echocardiogram performed which showed normal LV function and no valvular abnormalities. In 09/2003 she had a Myoview stress test that showed no abnormalities as well. She also has a hx of HTN and HLD  Karina [name] was last seen by Dr. Marlou Porch on 01/06/2017 and was doing well at that time with no complaints of recent palpitations, no SOB or chest pain. She was determined to be stable enough to be seen on an as needed basis per chart review.    Today she comes after being referred by her primary care physician for recurrent palpitations.  Karina [name] reports that her palpitations have been occurring almost nightly for the last 2 to 3 weeks.  She has no associated symptoms such as chest pain, shortness of breath, PND, nausea, vomiting or diaphoresis.  She states that during the day she does not feel many of her palpitations and describes them as a "fluttering" in her chest.  She states that the episodes will last approximately 30 seconds to 1 minute in duration and then goes away on its own.  She has a history of SVT in which she states this does not feel similar.   She also describes mid upper back discomfort in which she cannot pinpoint what exacerbates this.  Again, she has no associated symptoms and the pain does not radiate to her chest,  arms or upper neck.  She has no personal or family history of CAD.  She reports that she has been doing more "spring cleaning" during this time and this may be the cause.  Prior to [place] she was a daily exerciser and reports this is been difficult for her being more stuck inside as she is not nearly as active.  She reports taking Tylenol/Motrin for her symptoms with relief.   Past Medical History:  Diagnosis Date  . Anemia    iron deficiency  . ANEMIA, CHRONIC 08/13/2006  . Arthritis    left knee  . Atrophic vaginitis 10/03/2013  . CHICKENPOX, HX OF 01/28/2011  . CONSTIPATION 01/28/2011  . Depression with anxiety 04/02/2012   Husband, [name] died in 08-29-11 after 40 years of marriage  Lost a 73 year old daughter to cancer   . DIVERTICULOSIS, COLON 01/28/2011  . Dyslipidemia   . Ejection fraction    EF 55%, echo, 2007  . Endometriosis   . Fatigue 1/11   w/ exercise.. responded to increased beta blocker dose  . GERD 12/28/2007  . GERD (gastroesophageal reflux disease)   . Grief reaction 04-02-2012  . HTN (hypertension) 03/06/2011  . Hypercholesteremia Apr 02, 2012  . HYPERLIPIDEMIA 12/17/2007  . Lesion of external ear 03/06/2011  . Lichen sclerosus et atrophicus of the vulva 08/10/2013  . MEASLES, HX OF 01/28/2011  . Medicare annual wellness visit, subsequent 05/09/2014  . Normal  cardiac stress test    Nuclear, 2004, normal  . OAB (overactive bladder) 12/17/2015  . Osteoporosis   . OSTEOPOROSIS 08/13/2006  . Other specified cardiac dysrhythmias(427.89) 09/17/2010  . Post menopausal problems 03/06/2011  . Preventative health care 12/17/2015  . Sinus tachycardia    mild-- retesting  . Sinusitis 12/16/2011  . Sinusitis, acute 11/02/2013  . SVT (supraventricular tachycardia) ([place]) 2007   3-4 beats.. Holter    Past Surgical History:  Procedure Laterality Date  . ABDOMINAL HYSTERECTOMY     w/ unilateral oophorectomy-bleeding, ovarian cysts, left ovary left in place?  Marland Kitchen BREAST SURGERY      Implants--Silicone  . right knee arthroscopy, cleaned       Current Outpatient Medications  Medication Sig Dispense Refill  . ALPRAZolam (XANAX) 0.5 MG tablet Take 1 tablet (0.5 mg total) by mouth as needed for sleep or anxiety. 1/2 to 2 tabs po bid prn for anxiety, insomnia 90 tablet 0  . aspirin 81 MG tablet Take 81 mg by mouth daily.      Marland Kitchen BIOTIN PO Take 1 tablet by mouth daily.    . Cholecalciferol (VITAMIN D-3 PO) Take 5,000 Units by mouth daily.    . cimetidine (TAGAMET) 200 MG tablet Take 200 mg by mouth at bedtime.    Marland Kitchen co-enzyme Q-10 30 MG capsule Take 30 mg by mouth at bedtime.     . conjugated estrogens (PREMARIN) vaginal cream Place 1 Applicatorful vaginally. 1/4 gram per vagina once weekly.    . diphenhydramine-acetaminophen (TYLENOL PM) 25-500 MG TABS Take 1 tablet by mouth at bedtime as needed (pain/sleep.).     Marland Kitchen Lactobacillus (PROBIATA) TABS Take 1 tablet by mouth daily.    Marland Kitchen levothyroxine (SYNTHROID) 50 MCG tablet Take 1 tablet by mouth daily.    . metoprolol tartrate (LOPRESSOR) 50 MG tablet Take 1 tablet by mouth 2 (two) times a day.    . oxybutynin (DITROPAN) 5 MG tablet Take 0.5 tablets (2.5 mg total) by mouth 2 (two) times daily. 90 tablet 1  . simvastatin (ZOCOR) 10 MG tablet Take 1 tablet (10 mg total) by mouth daily at 6 PM. 90 tablet 1  . diltiazem (CARDIZEM CD) 240 MG 24 hr capsule Take 1 capsule (240 mg total) by mouth daily. 90 capsule 3   No current facility-administered medications for this visit.     Allergies:   Patient has no known allergies.    Social History:  The patient  reports that she has never smoked. She has never used smokeless tobacco. She reports that she does not drink alcohol or use drugs.   Family History:  The patient's family history includes Alcohol abuse in her brother; Cancer in her brother, father, paternal aunt, paternal uncle, and sister; Cancer (age of onset: 36) in her mother; Heart attack in her brother and paternal uncle;  Hypertension in her mother and son; Irritable bowel syndrome in her daughter; Lymphoma in her brother; Migraines in her daughter; Mitral valve prolapse in her daughter; Other in her maternal grandmother and mother; Stroke in her daughter and mother; Transient ischemic attack in her mother.    ROS:  Please see the history of present illness.   Otherwise, review of systems are positive for none.   All other systems are reviewed and negative.    PHYSICAL EXAM: VS:  BP 130/80   Pulse 73   Ht 5\' 3"  (1.6 m)   Wt 139 lb 9.6 oz (63.3 kg)   SpO2 98%  BMI 24.73 kg/m  , BMI Body mass index is 24.73 kg/m.    General: Well developed, well nourished, NAD Skin: Warm, dry, intact  Neck: Negative for carotid bruits. No JVD Lungs:Clear to ausculation bilaterally. No wheezes, rales, or rhonchi. Breathing is unlabored. Cardiovascular: RRR with S1 S2. No murmurs, rubs, gallops, or LV heave appreciated. Extremities: No edema. No clubbing or cyanosis. DP/PT pulses 2+ bilaterally Neuro: Alert and oriented. No focal deficits. No facial asymmetry. MAE spontaneously. Psych: Responds to questions appropriately with normal affect.     EKG:  EKG is ordered today. The ekg ordered today demonstrates normal sinus rhythm, HR 73 bpm with wandering baseline   Recent Labs: No results found for requested labs within last 8760 hours.    Lipid Panel    Component Value Date/Time   CHOL 206 (H) 06/10/2016 1405   TRIG 187.0 (H) 06/10/2016 1405   HDL 65.80 06/10/2016 1405   CHOLHDL 3 06/10/2016 1405   VLDL 37.4 06/10/2016 1405   LDLCALC 103 (H) 06/10/2016 1405   LDLDIRECT 156.5 03/10/2012 0921     Wt Readings from Last 3 Encounters:  05/24/19 139 lb 9.6 oz (63.3 kg)  01/06/17 129 lb 12.8 oz (58.9 kg)  08/28/16 132 lb 9.6 oz (60.1 kg)    Other studies Reviewed: Additional studies/ records that were reviewed today include: Care Everywhere records   ASSESSMENT AND PLAN:  1. Hx of paroxysmal SVT: -Brief  episode dating back to 2007 with no recurrence after being placed on Toprol and diltiazem -No atrial fibrillation per Holter monitor and therefore no anticoagulation needed  -See plan below  2. Hx of palpitations: -Reports 2 to 3-week history of almost nightly palpitations with no associated symptoms such as shortness of breath, nausea, vomiting, diaphoresis or chest pain -EKG today with NSR, HR 73 bpm -Has a prior history of SVT in which she wore a monitor and has been well controlled on diltiazem and metoprolol dosing.  She reports this does not feel similar to her SVT episodes.  Describes symptoms more as a fluttering in her chest -Will obtain TSH, BMET -Placed 2-week monitor and follow closely thereafter -We will increase diltiazem to 240 mg once daily, continue Toprol-XL 50 mg twice daily (prescribed by PCP for elevated BP) -BP stable today at 130/80, HR 73  3.  Mid back discomfort: -Appears to be more musculoskeletal in etiology with no radiation or associated symptoms that has been relieved with Tylenol/Motrin -Discussed monitoring symptoms and continuing with Tylenol for discomfort along with heat/ice compresses -We will obtain monitor as well as an echocardiogram to evaluate for LV and valve dysfunction however low suspicion for abnormalities -If symptoms do not lessen over the next several weeks would consider Lexiscan stress test for more definitive evaluation  4. HLD: -Per PCP -Continue statin   5.  HTN: -Stable today at 130/80 -Toprol-XL increased by PCP to 50 mg twice daily with good response -Continue   Current medicines are reviewed at length with the patient today.  The patient does not have concerns regarding medicines.  The following changes have been made: Increase diltiazem to 240 mg once daily  Labs/ tests ordered today include: BMET, TSH, monitor-2-week, echocardiogram  Orders Placed This Encounter  Procedures  . Basic metabolic panel  . TSH  . LONG TERM  MONITOR (3-14 DAYS)  . EKG 12-Lead  . ECHOCARDIOGRAM COMPLETE    Disposition:   FU with APP/Dr. Marlou Porch in 3 weeks  Signed, Kathyrn Drown, NP  05/24/2019 9:40  AM    [facility] [address], [place], [place]  [address] Phone: [phone]; Fax: [phone]

## 2019-05-24 ENCOUNTER — Other Ambulatory Visit: Payer: Self-pay

## 2019-05-24 ENCOUNTER — Encounter: Payer: Self-pay | Admitting: Cardiology

## 2019-05-24 ENCOUNTER — Ambulatory Visit (INDEPENDENT_AMBULATORY_CARE_PROVIDER_SITE_OTHER): Payer: Medicare HMO | Admitting: Cardiology

## 2019-05-24 VITALS — BP 130/80 | HR 73 | Ht 63.0 in | Wt 139.6 lb

## 2019-05-24 DIAGNOSIS — R002 Palpitations: Secondary | ICD-10-CM

## 2019-05-24 DIAGNOSIS — I471 Supraventricular tachycardia, unspecified: Secondary | ICD-10-CM

## 2019-05-24 DIAGNOSIS — E7841 Elevated Lipoprotein(a): Secondary | ICD-10-CM | POA: Diagnosis not present

## 2019-05-24 DIAGNOSIS — I10 Essential (primary) hypertension: Secondary | ICD-10-CM

## 2019-05-24 MED ORDER — DILTIAZEM HCL ER COATED BEADS 240 MG PO CP24: 240.0000 mg | ORAL_CAPSULE | Freq: Every day | ORAL | 3 refills | Status: DC

## 2019-05-24 NOTE — Patient Instructions (Signed)
Medication Instructions:  INCREASE: Diltiazem to 240 mg once a day   If you need a refill on your cardiac medications before your next appointment, please call your pharmacy.   Lab work: TODAY" BMET & TSH  If you have labs (blood work) drawn today and your tests are completely normal, you will receive your results only by: Marland Kitchen MyChart Message (if you have MyChart) OR . A paper copy in the mail If you have any lab test that is abnormal or we need to change your treatment, we will call you to review the results.  Testing/Procedures: You physician has ordered for you to wear a long term monitor for 2 weeks   Your physician has requested that you have an echocardiogram. Echocardiography is a painless test that uses sound waves to create images of your heart. It provides your doctor with information about the size and shape of your heart and how well your heart's chambers and valves are working. This procedure takes approximately one hour. There are no restrictions for this procedure.    Follow-Up: At [facility], you and your health needs are our priority.  As part of our continuing mission to provide you with exceptional heart care, we have created designated Provider Care Teams.  These Care Teams include your primary Cardiologist (physician) and Advanced Practice Providers (APPs -  Physician Assistants and Nurse Practitioners) who all work together to provide you with the care you need, when you need it. You will need a follow up appointment after you are finished wearing your monitor.  You may see Dr. Marlou Porch or one of the following Advanced Practice Providers on your designated Care Team:   Truitt Merle, NP Cecilie Kicks, NP . Kathyrn Drown, NP  Any Other Special Instructions Will Be Listed Below (If Applicable).

## 2019-05-25 ENCOUNTER — Telehealth: Payer: Self-pay | Admitting: *Deleted

## 2019-05-25 LAB — TSH: TSH: 4 u[IU]/mL (ref 0.450–4.500)

## 2019-05-25 LAB — BASIC METABOLIC PANEL
BUN/Creatinine Ratio: 25 (ref 12–28)
BUN: 29 mg/dL — ABNORMAL HIGH (ref 8–27)
CO2: 25 mmol/L (ref 20–29)
Calcium: 10.6 mg/dL — ABNORMAL HIGH (ref 8.7–10.3)
Chloride: 100 mmol/L (ref 96–106)
Creatinine, Ser: 1.18 mg/dL — ABNORMAL HIGH (ref 0.57–1.00)
GFR calc Af Amer: 49 mL/min/{1.73_m2} — ABNORMAL LOW (ref >59)
GFR calc non Af Amer: 43 mL/min/{1.73_m2} — ABNORMAL LOW (ref >59)
Glucose: 74 mg/dL (ref 65–99)
Potassium: 4.1 mmol/L (ref 3.5–5.2)
Sodium: 141 mmol/L (ref 134–144)

## 2019-05-25 NOTE — Telephone Encounter (Signed)
14 day ZIO XT long term holter monitor to be mailed to patients home.  Instructions reviewed briefly as they are included in the monitor kit.

## 2019-06-01 ENCOUNTER — Ambulatory Visit (INDEPENDENT_AMBULATORY_CARE_PROVIDER_SITE_OTHER): Payer: Medicare HMO

## 2019-06-01 ENCOUNTER — Other Ambulatory Visit: Payer: Self-pay

## 2019-06-01 ENCOUNTER — Ambulatory Visit (HOSPITAL_COMMUNITY): Payer: Medicare HMO | Attending: Cardiology

## 2019-06-01 DIAGNOSIS — R002 Palpitations: Secondary | ICD-10-CM

## 2019-06-01 DIAGNOSIS — I10 Essential (primary) hypertension: Secondary | ICD-10-CM | POA: Insufficient documentation

## 2019-06-01 DIAGNOSIS — I083 Combined rheumatic disorders of mitral, aortic and tricuspid valves: Secondary | ICD-10-CM | POA: Diagnosis not present

## 2019-06-01 DIAGNOSIS — E785 Hyperlipidemia, unspecified: Secondary | ICD-10-CM | POA: Insufficient documentation

## 2019-06-23 DIAGNOSIS — R002 Palpitations: Secondary | ICD-10-CM | POA: Diagnosis not present

## 2019-06-29 ENCOUNTER — Encounter: Payer: Self-pay | Admitting: Cardiology

## 2019-06-29 ENCOUNTER — Other Ambulatory Visit: Payer: Self-pay

## 2019-06-29 ENCOUNTER — Ambulatory Visit (INDEPENDENT_AMBULATORY_CARE_PROVIDER_SITE_OTHER): Payer: Medicare HMO | Admitting: Cardiology

## 2019-06-29 VITALS — BP 154/80 | HR 72 | Ht 63.0 in | Wt 138.0 lb

## 2019-06-29 DIAGNOSIS — R002 Palpitations: Secondary | ICD-10-CM | POA: Diagnosis not present

## 2019-06-29 DIAGNOSIS — I10 Essential (primary) hypertension: Secondary | ICD-10-CM

## 2019-06-29 DIAGNOSIS — I471 Supraventricular tachycardia: Secondary | ICD-10-CM | POA: Diagnosis not present

## 2019-06-29 NOTE — Patient Instructions (Signed)
Medication Instructions:  The current medical regimen is effective;  continue present plan and medications.  If you need a refill on your cardiac medications before your next appointment, please call your pharmacy.   Follow-Up: At [facility], you and your health needs are our priority.  As part of our continuing mission to provide you with exceptional heart care, we have created designated Provider Care Teams.  These Care Teams include your primary Cardiologist (physician) and Advanced Practice Providers (APPs -  Physician Assistants and Nurse Practitioners) who all work together to provide you with the care you need, when you need it. You will need a follow up appointment in 6 months.  Please call our office 2 months in advance to schedule this appointment.  You may see Mark Skains, MD or one of the following Advanced Practice Providers on your designated Care Team:   Lori Gerhardt, NP Laura Ingold, NP . Jill McDaniel, NP  Thank you for choosing [facility]!!       

## 2019-06-29 NOTE — Progress Notes (Signed)
Cardiology Office Note:    Date:  06/29/2019   ID:  Karina [name], DOB 1933-03-01, MRN [id number]  PCP:  Burnard Bunting, MD  Cardiologist:  Candee Furbish, MD  Electrophysiologist:  None   Referring MD: Burnard Bunting, MD   Here for the follow-up of palpitations  History of Present Illness:    Karina [name] is a 85 y.o. female with palpitations brief episode of SVT on Holter monitor 4 beats only, former patient of Dr. Ron [name] with normal LV function remote stress test was normal in 2004.  I saw her last in 2018 and Sharee Pimple saw her in July 2020.  She sometimes will feel a fluttering sensation in her chest.  Sometimes it will last 30 seconds to 1 minute duration.  She sometimes also feels some mid upper back discomfort is described to jail.  No family history of CAD.  She has been doing more spring cleaning.  Overall she has been doing quite well.  She did not have any rough episodes during the event monitor although it did pick up some paroxysmal atrial tachycardia.  No atrial fibrillation.  Past Medical History:  Diagnosis Date  . Anemia    iron deficiency  . ANEMIA, CHRONIC 08/13/2006  . Arthritis    left knee  . Atrophic vaginitis 10/03/2013  . CHICKENPOX, HX OF 01/28/2011  . CONSTIPATION 01/28/2011  . Depression with anxiety Apr 09, 2012   Husband, [name] died in 09/05/2011 after 17 years of marriage  Lost a 25 year old daughter to cancer   . DIVERTICULOSIS, COLON 01/28/2011  . Dyslipidemia   . Ejection fraction    EF 55%, echo, 2007  . Endometriosis   . Fatigue 1/11   w/ exercise.. responded to increased beta blocker dose  . GERD 12/28/2007  . GERD (gastroesophageal reflux disease)   . Grief reaction Apr 09, 2012  . HTN (hypertension) 03/06/2011  . Hypercholesteremia 2012-04-09  . HYPERLIPIDEMIA 12/17/2007  . Lesion of external ear 03/06/2011  . Lichen sclerosus et atrophicus of the vulva 08/10/2013  . MEASLES, HX OF 01/28/2011  . Medicare annual wellness visit, subsequent  05/09/2014  . Normal cardiac stress test    Nuclear, 2004, normal  . OAB (overactive bladder) 12/17/2015  . Osteoporosis   . OSTEOPOROSIS 08/13/2006  . Other specified cardiac dysrhythmias(427.89) 09/17/2010  . Post menopausal problems 03/06/2011  . Preventative health care 12/17/2015  . Sinus tachycardia    mild-- retesting  . Sinusitis 12/16/2011  . Sinusitis, acute 11/02/2013  . SVT (supraventricular tachycardia) ([place]) 2007   3-4 beats.. Holter    Past Surgical History:  Procedure Laterality Date  . ABDOMINAL HYSTERECTOMY     w/ unilateral oophorectomy-bleeding, ovarian cysts, left ovary left in place?  Marland Kitchen BREAST SURGERY     Implants--Silicone  . right knee arthroscopy, cleaned      Current Medications: Current Meds  Medication Sig  . ALPRAZolam (XANAX) 0.5 MG tablet Take 1 tablet (0.5 mg total) by mouth as needed for sleep or anxiety. 1/2 to 2 tabs po bid prn for anxiety, insomnia  . aspirin 81 MG tablet Take 81 mg by mouth daily.    . Cholecalciferol (VITAMIN D-3 PO) Take 5,000 Units by mouth daily.  Marland Kitchen co-enzyme Q-10 30 MG capsule Take 30 mg by mouth at bedtime.   . conjugated estrogens (PREMARIN) vaginal cream Place 1 Applicatorful vaginally. 1/4 gram per vagina once weekly.  Marland Kitchen diltiazem (CARDIZEM CD) 240 MG 24 hr capsule Take 1 capsule (240 mg total) by  mouth daily.  . diphenhydramine-acetaminophen (TYLENOL PM) 25-500 MG TABS Take 1 tablet by mouth at bedtime as needed (pain/sleep.).   Marland Kitchen Lactobacillus (PROBIATA) TABS Take 1 tablet by mouth daily.  Marland Kitchen levothyroxine (SYNTHROID) 50 MCG tablet Take 1 tablet by mouth daily.  . meloxicam (MOBIC) 15 MG tablet Take 15 mg by mouth daily.  . metoprolol tartrate (LOPRESSOR) 50 MG tablet Take 1 tablet by mouth 2 (two) times a day.  . simvastatin (ZOCOR) 10 MG tablet Take 1 tablet (10 mg total) by mouth daily at 6 PM.     Allergies:   Patient has no known allergies.   Social History   Socioeconomic History  . Marital status: Married     Spouse name: Not on file  . Number of children: Not on file  . Years of education: Not on file  . Highest education level: Not on file  Occupational History  . Not on file  Social Needs  . Financial resource strain: Not on file  . Food insecurity    Worry: Not on file    Inability: Not on file  . Transportation needs    Medical: Not on file    Non-medical: Not on file  Tobacco Use  . Smoking status: Never Smoker  . Smokeless tobacco: Never Used  Substance and Sexual Activity  . Alcohol use: No    Alcohol/week: 0.0 standard drinks  . Drug use: No  . Sexual activity: Never    Partners: Male    Birth control/protection: Surgical    Comment: TAH--Poss. left ovary remains  Lifestyle  . Physical activity    Days per week: Not on file    Minutes per session: Not on file  . Stress: Not on file  Relationships  . Social Herbalist on phone: Not on file    Gets together: Not on file    Attends religious service: Not on file    Active member of club or organization: Not on file    Attends meetings of clubs or organizations: Not on file    Relationship status: Not on file  Other Topics Concern  . Not on file  Social History Narrative  . Not on file     Family History: The patient's family history includes Alcohol abuse in her brother; Cancer in her brother, father, paternal aunt, paternal uncle, and sister; Cancer (age of onset: 92) in her mother; Heart attack in her brother and paternal uncle; Hypertension in her mother and son; Irritable bowel syndrome in her daughter; Lymphoma in her brother; Migraines in her daughter; Mitral valve prolapse in her daughter; Other in her maternal grandmother and mother; Stroke in her daughter and mother; Transient ischemic attack in her mother.  ROS:   Please see the history of present illness.    No fevers chills nausea vomiting syncope all other systems reviewed and are negative.  EKGs/Labs/Other Studies Reviewed:    The  following studies were reviewed today: Holter monitor reviewed with her in clinic  EKG:  EKG is not ordered today.    Recent Labs: 05/24/2019: BUN 29; Creatinine, Ser 1.18; Potassium 4.1; Sodium 141; TSH 4.000  Recent Lipid Panel    Component Value Date/Time   CHOL 206 (H) 06/10/2016 1405   TRIG 187.0 (H) 06/10/2016 1405   HDL 65.80 06/10/2016 1405   CHOLHDL 3 06/10/2016 1405   VLDL 37.4 06/10/2016 1405   LDLCALC 103 (H) 06/10/2016 1405   LDLDIRECT 156.5 03/10/2012 8119  Physical Exam:    VS:  BP (!) 154/80   Pulse 72   Ht 5\' 3"  (1.6 m)   Wt 138 lb (62.6 kg)   SpO2 99%   BMI 24.45 kg/m     Wt Readings from Last 3 Encounters:  06/29/19 138 lb (62.6 kg)  05/24/19 139 lb 9.6 oz (63.3 kg)  01/06/17 129 lb 12.8 oz (58.9 kg)     GEN:  Well nourished, well developed in no acute distress HEENT: Normal NECK: No JVD; No carotid bruits LYMPHATICS: No lymphadenopathy CARDIAC: RRR, no murmurs, rubs, gallops RESPIRATORY:  Clear to auscultation without rales, wheezing or rhonchi  ABDOMEN: Soft, non-tender, non-distended MUSCULOSKELETAL:  No edema; No deformity  SKIN: Warm and dry NEUROLOGIC:  Alert and oriented x 3 PSYCHIATRIC:  Normal affect   ASSESSMENT:    1. Paroxysmal atrial tachycardia (HCC)   2. Palpitations   3. Essential hypertension    PLAN:    In order of problems listed above:  Paroxysmal atrial tachycardia - Holter monitor no atrial fibrillation.  No anticoagulation.  Placed on increased Toprol.  Diltiazem.  Doing well.  Palpitations - Prior EKG normal sinus rhythm 73 reviewed.  Two-week monitor was placed.  Toprol 50 mg twice a day prescribed.  Paroxysmal atrial tachycardia noted, PVCs also noted.  PVCs -Continue with Toprol and diltiazem.  Essential hypertension - Continue to monitor.  Medications reviewed.   Medication Adjustments/Labs and Tests Ordered: Current medicines are reviewed at length with the patient today.  Concerns regarding  medicines are outlined above.  No orders of the defined types were placed in this encounter.  No orders of the defined types were placed in this encounter.   Patient Instructions  Medication Instructions:  The current medical regimen is effective;  continue present plan and medications.  If you need a refill on your cardiac medications before your next appointment, please call your pharmacy.   Follow-Up: At [facility], you and your health needs are our priority.  As part of our continuing mission to provide you with exceptional heart care, we have created designated Provider Care Teams.  These Care Teams include your primary Cardiologist (physician) and Advanced Practice Providers (APPs -  Physician Assistants and Nurse Practitioners) who all work together to provide you with the care you need, when you need it. You will need a follow up appointment in 6 months.  Please call our office 2 months in advance to schedule this appointment.  You may see Candee Furbish, MD or one of the following Advanced Practice Providers on your designated Care Team:   Truitt Merle, NP Cecilie Kicks, NP . Kathyrn Drown, NP  Thank you for choosing [facility]!!         Signed, Candee Furbish, MD  06/29/2019 2:43 PM    [place]

## 2019-07-05 DIAGNOSIS — H1089 Other conjunctivitis: Secondary | ICD-10-CM | POA: Diagnosis not present

## 2019-07-06 DIAGNOSIS — H16143 Punctate keratitis, bilateral: Secondary | ICD-10-CM | POA: Diagnosis not present

## 2019-07-06 DIAGNOSIS — H16103 Unspecified superficial keratitis, bilateral: Secondary | ICD-10-CM | POA: Diagnosis not present

## 2019-07-13 DIAGNOSIS — H16103 Unspecified superficial keratitis, bilateral: Secondary | ICD-10-CM | POA: Diagnosis not present

## 2019-08-13 DIAGNOSIS — H52223 Regular astigmatism, bilateral: Secondary | ICD-10-CM | POA: Diagnosis not present

## 2019-08-13 DIAGNOSIS — H5213 Myopia, bilateral: Secondary | ICD-10-CM | POA: Diagnosis not present

## 2019-08-13 DIAGNOSIS — H5212 Myopia, left eye: Secondary | ICD-10-CM | POA: Diagnosis not present

## 2019-09-01 DIAGNOSIS — E785 Hyperlipidemia, unspecified: Secondary | ICD-10-CM | POA: Diagnosis not present

## 2019-09-01 DIAGNOSIS — G4700 Insomnia, unspecified: Secondary | ICD-10-CM | POA: Diagnosis not present

## 2019-09-01 DIAGNOSIS — M40209 Unspecified kyphosis, site unspecified: Secondary | ICD-10-CM | POA: Diagnosis not present

## 2019-09-01 DIAGNOSIS — Z23 Encounter for immunization: Secondary | ICD-10-CM | POA: Diagnosis not present

## 2019-09-01 DIAGNOSIS — I10 Essential (primary) hypertension: Secondary | ICD-10-CM | POA: Diagnosis not present

## 2019-09-01 DIAGNOSIS — M12831 Other specific arthropathies, not elsewhere classified, right wrist: Secondary | ICD-10-CM | POA: Diagnosis not present

## 2019-09-01 DIAGNOSIS — E039 Hypothyroidism, unspecified: Secondary | ICD-10-CM | POA: Diagnosis not present

## 2019-09-01 DIAGNOSIS — L853 Xerosis cutis: Secondary | ICD-10-CM | POA: Diagnosis not present

## 2019-09-01 DIAGNOSIS — K219 Gastro-esophageal reflux disease without esophagitis: Secondary | ICD-10-CM | POA: Diagnosis not present

## 2019-09-01 DIAGNOSIS — Z8709 Personal history of other diseases of the respiratory system: Secondary | ICD-10-CM | POA: Diagnosis not present

## 2019-12-27 DIAGNOSIS — Z1231 Encounter for screening mammogram for malignant neoplasm of breast: Secondary | ICD-10-CM | POA: Diagnosis not present

## 2020-01-06 ENCOUNTER — Ambulatory Visit: Payer: Medicare HMO | Attending: Internal Medicine

## 2020-01-06 DIAGNOSIS — Z23 Encounter for immunization: Secondary | ICD-10-CM | POA: Insufficient documentation

## 2020-01-06 NOTE — Progress Notes (Signed)
   Covid-19 Vaccination Clinic  Name:  Karina [name]    MRN: [id number] DOB: [birth year]  01/06/2020  [name] was observed post Covid-19 immunization for 15 minutes without incidence. She was provided with Vaccine Information Sheet and instruction to access the V-Safe system.   [name] was instructed to call 911 with any severe reactions post vaccine: Marland Kitchen Difficulty breathing  . Swelling of your face and throat  . A fast heartbeat  . A bad rash all over your body  . Dizziness and weakness    Immunizations Administered    Name Date Dose VIS Date Route   Pfizer COVID-19 Vaccine 01/06/2020  1:38 PM 0.3 mL 10/22/2019 Intramuscular   Manufacturer: [place]   Lot: [id number]   [place]: [id number]

## 2020-02-01 ENCOUNTER — Ambulatory Visit: Payer: Medicare HMO | Attending: Internal Medicine

## 2020-02-01 ENCOUNTER — Ambulatory Visit: Payer: Medicare HMO

## 2020-02-01 DIAGNOSIS — Z23 Encounter for immunization: Secondary | ICD-10-CM

## 2020-02-01 NOTE — Progress Notes (Signed)
   Covid-19 Vaccination Clinic  Name:  Karina [name]    MRN: [id number] DOB: 06-Jul-1933  02/01/2020  [name] was observed post Covid-19 immunization for 15 minutes without incident. She was provided with Vaccine Information Sheet and instruction to access the V-Safe system.   [name] was instructed to call 911 with any severe reactions post vaccine: Marland Kitchen Difficulty breathing  . Swelling of face and throat  . A fast heartbeat  . A bad rash all over body  . Dizziness and weakness   Immunizations Administered    Name Date Dose VIS Date Route   Pfizer COVID-19 Vaccine 02/01/2020 12:23 PM 0.3 mL 10/22/2019 Intramuscular   Manufacturer: [place]   Lot: [id number]   [place]: [id number]

## 2020-02-28 DIAGNOSIS — E038 Other specified hypothyroidism: Secondary | ICD-10-CM | POA: Diagnosis not present

## 2020-02-28 DIAGNOSIS — E7849 Other hyperlipidemia: Secondary | ICD-10-CM | POA: Diagnosis not present

## 2020-02-28 DIAGNOSIS — M810 Age-related osteoporosis without current pathological fracture: Secondary | ICD-10-CM | POA: Diagnosis not present

## 2020-02-28 DIAGNOSIS — Z0000 Encounter for general adult medical examination without abnormal findings: Secondary | ICD-10-CM | POA: Diagnosis not present

## 2020-03-06 ENCOUNTER — Encounter: Payer: Self-pay | Admitting: Cardiology

## 2020-03-06 DIAGNOSIS — M40209 Unspecified kyphosis, site unspecified: Secondary | ICD-10-CM | POA: Diagnosis not present

## 2020-03-06 DIAGNOSIS — G4700 Insomnia, unspecified: Secondary | ICD-10-CM | POA: Diagnosis not present

## 2020-03-06 DIAGNOSIS — E038 Other specified hypothyroidism: Secondary | ICD-10-CM | POA: Diagnosis not present

## 2020-03-06 DIAGNOSIS — E7849 Other hyperlipidemia: Secondary | ICD-10-CM | POA: Diagnosis not present

## 2020-03-06 DIAGNOSIS — Z0000 Encounter for general adult medical examination without abnormal findings: Secondary | ICD-10-CM | POA: Diagnosis not present

## 2020-03-06 DIAGNOSIS — F419 Anxiety disorder, unspecified: Secondary | ICD-10-CM | POA: Diagnosis not present

## 2020-03-06 DIAGNOSIS — R82998 Other abnormal findings in urine: Secondary | ICD-10-CM | POA: Diagnosis not present

## 2020-03-06 DIAGNOSIS — M810 Age-related osteoporosis without current pathological fracture: Secondary | ICD-10-CM | POA: Diagnosis not present

## 2020-03-06 DIAGNOSIS — H539 Unspecified visual disturbance: Secondary | ICD-10-CM | POA: Diagnosis not present

## 2020-03-06 DIAGNOSIS — I10 Essential (primary) hypertension: Secondary | ICD-10-CM | POA: Diagnosis not present

## 2020-03-06 NOTE — Progress Notes (Signed)
Cardiology Office Note   Date:  03/16/2020   ID:  Karina, [name] January 21, 1933, MRN [id number]  PCP:  Burnard Bunting, MD  Cardiologist: Dr. Marlou Porch, MD  No chief complaint on file.   History of Present Illness: Karina [name] is a 86 y.o. female who presents for 39-month follow-up, seen for Dr. Marlou Porch.  [name] has a history of palpitations, one brief episode of supraventricular tachycardia documented on a Holter monitor (4 beats) with no documented atrial fibrillation and no need for anticoagulation however I cannot find this report in Epic for my personal review. Per chart review, Holter was placed prior to 2012. Also had an echocardiogram performed which showed normal LV function and no valvular abnormalities. In 09/2003 she had a Myoview stress test that showed no abnormalities as well. She also has a hx of HTN and HLD  She was seen 05/24/2019 after being referred from her PCP for recurrent palpitations. TSH and BUN were obtained as well as a 2-week monitor.  Her diltiazem was increased to 240 mg p.o. daily with continuation of Toprol XL 50 mg.  Monitor showed paroxysmal atrial tachycardia as well as PVCs therefore she was continued on Toprol.  Echocardiogram with normal LV function and mild diastolic dysfunction.  She was last seen by Dr. Marlou Porch 06/29/2019 and was doing well from a CV standpoint.  Changes were made at that time and plan was for follow-up in 6 months.  Today Karina [name] is here alone and states that she has been doing well. Does state that she had an episode of dizziness with visual changes about three weeks ago for which she called Dr. Jacquiline Doe office. She underwent carotid dopplers that showed mild CAD with bilateral ICA at 1-39%. Lab work obtained with PCP which, per patient report, showed normal CHO and LDL. She is already on statin therapy as well as ASA. Discussed that this will need to be followed closely. She denies chest pain, palpitations, LE edema,  SOB, PND or syncope.   Past Medical History:  Diagnosis Date  . Anemia    iron deficiency  . ANEMIA, CHRONIC 08/13/2006  . Arthritis    left knee  . Atrophic vaginitis 10/03/2013  . CHICKENPOX, HX OF 01/28/2011  . CONSTIPATION 01/28/2011  . Depression with anxiety 26-Mar-2012   Husband, [name] died in Aug 21, 2011 after 61 years of marriage  Lost a 48 year old daughter to cancer   . DIVERTICULOSIS, COLON 01/28/2011  . Dyslipidemia   . Ejection fraction    EF 55%, echo, 2007  . Endometriosis   . Fatigue 1/11   w/ exercise.. responded to increased beta blocker dose  . GERD 12/28/2007  . GERD (gastroesophageal reflux disease)   . Grief reaction 2012-03-26  . HTN (hypertension) 03/06/2011  . Hypercholesteremia 2012/03/26  . HYPERLIPIDEMIA 12/17/2007  . Lesion of external ear 03/06/2011  . Lichen sclerosus et atrophicus of the vulva 08/10/2013  . MEASLES, HX OF 01/28/2011  . Medicare annual wellness visit, subsequent 05/09/2014  . Normal cardiac stress test    Nuclear, 2004, normal  . OAB (overactive bladder) 12/17/2015  . Osteoporosis   . OSTEOPOROSIS 08/13/2006  . Other specified cardiac dysrhythmias(427.89) 09/17/2010  . Post menopausal problems 03/06/2011  . Preventative health care 12/17/2015  . Sinus tachycardia    mild-- retesting  . Sinusitis 12/16/2011  . Sinusitis, acute 11/02/2013  . SVT (supraventricular tachycardia) ([place]) 2007   3-4 beats.. Holter    Past Surgical History:  Procedure Laterality Date  . ABDOMINAL HYSTERECTOMY     w/ unilateral oophorectomy-bleeding, ovarian cysts, left ovary left in place?  Marland Kitchen BREAST SURGERY     Implants--Silicone  . right knee arthroscopy, cleaned       Current Outpatient Medications  Medication Sig Dispense Refill  . ALPRAZolam (XANAX) 0.5 MG tablet Take 1 tablet (0.5 mg total) by mouth as needed for sleep or anxiety. 1/2 to 2 tabs po bid prn for anxiety, insomnia 90 tablet 0  . aspirin 81 MG tablet Take 81 mg by mouth daily.      .  Cholecalciferol (VITAMIN D-3 PO) Take 5,000 Units by mouth daily.    Marland Kitchen conjugated estrogens (PREMARIN) vaginal cream Place 1 Applicatorful vaginally. 1/4 gram per vagina once weekly.    Marland Kitchen diltiazem (CARDIZEM CD) 240 MG 24 hr capsule Take 1 capsule (240 mg total) by mouth daily. 90 capsule 3  . diphenhydramine-acetaminophen (TYLENOL PM) 25-500 MG TABS Take 1 tablet by mouth at bedtime as needed (pain/sleep.).     Marland Kitchen Lactobacillus (PROBIATA) TABS Take 1 tablet by mouth daily.    Marland Kitchen levothyroxine (SYNTHROID) 50 MCG tablet Take 1 tablet by mouth daily.    . meloxicam (MOBIC) 15 MG tablet Take 15 mg by mouth daily.    . metoprolol tartrate (LOPRESSOR) 50 MG tablet Take 1 tablet by mouth 2 (two) times a day.    . pantoprazole (PROTONIX) 40 MG tablet Take 40 mg by mouth daily.    . simvastatin (ZOCOR) 10 MG tablet Take 1 tablet (10 mg total) by mouth daily at 6 PM. 90 tablet 1   No current facility-administered medications for this visit.    Allergies:   Patient has no known allergies.    Social History:  The patient  reports that she has never smoked. She has never used smokeless tobacco. She reports that she does not drink alcohol or use drugs.   Family History:  The patient'sfamily history includes Alcohol abuse in her brother; Cancer in her brother, father, paternal aunt, paternal uncle, and sister; Cancer (age of onset: 28) in her mother; Heart attack in her brother and paternal uncle; Hypertension in her mother and son; Irritable bowel syndrome in her daughter; Lymphoma in her brother; Migraines in her daughter; Mitral valve prolapse in her daughter; Other in her maternal grandmother and mother; Stroke in her daughter and mother; Transient ischemic attack in her mother.    ROS:  Please see the history of present illness.   Otherwise, review of systems are positive for none.  All other systems are reviewed and negative.    PHYSICAL EXAM: VS:  BP (!) 146/94   Pulse 67   Ht 5\' 3"  (1.6 m)   Wt  139 lb 1.9 oz (63.1 kg)   SpO2 97%   BMI 24.64 kg/m  , BMI Body mass index is 24.64 kg/m.   General: Well developed, well nourished, NAD Neck: Negative for carotid bruits. No JVD Lungs:Clear to ausculation bilaterally. No wheezes, rales, or rhonchi. Breathing is unlabored. Cardiovascular: RRR with S1 S2. No murmur Extremities: No edema. Radial pulses 2+ bilaterally Neuro: Alert and oriented. No focal deficits. No facial asymmetry. MAE spontaneously. Psych: Responds to questions appropriately with normal affect.     EKG:  EKG is ordered today. The ekg ordered today demonstrates NSR with no acute changes, HR 67bpm   Recent Labs: 05/24/2019: BUN 29; Creatinine, Ser 1.18; Potassium 4.1; Sodium 141; TSH 4.000    Lipid Panel  Component Value Date/Time   CHOL 206 (H) 06/10/2016 1405   TRIG 187.0 (H) 06/10/2016 1405   HDL 65.80 06/10/2016 1405   CHOLHDL 3 06/10/2016 1405   VLDL 37.4 06/10/2016 1405   LDLCALC 103 (H) 06/10/2016 1405   LDLDIRECT 156.5 03/10/2012 0921     Wt Readings from Last 3 Encounters:  03/16/20 139 lb 1.9 oz (63.1 kg)  06/29/19 138 lb (62.6 kg)  05/24/19 139 lb 9.6 oz (63.3 kg)     Other studies Reviewed: Additional studies/ records that were reviewed today include:  Review of the above records demonstrates:  Echocardiogram 06/01/2019:  1. The left ventricle has normal systolic function with an ejection  fraction of 60-65%. The cavity size was normal. Left ventricular diastolic  Doppler parameters are consistent with impaired relaxation. No evidence of  left ventricular regional wall  motion abnormalities.  2. The right ventricle has normal systolic function. The cavity was  normal. There is no increase in right ventricular wall thickness.  3. There is mild mitral annular calcification present.  4. The aortic valve is tricuspid. Aortic valve regurgitation is mild by  color flow Doppler.   ASSESSMENT AND PLAN:  1. Hx of paroxysmal  SVT: -Brief episode dating back to 2007 with no recurrence after being placed on Toprol and diltiazem -No atrial fibrillation per Holter monitor and therefore no anticoagulation needed  -See plan below  2. Hx of palpitations: -Denies recurrence  -Continue diltiazem to 240 mg once daily, continue Toprol-XL 50 mg twice daily (prescribed by PCP for elevated BP)  3. Carotid artery disease: -Had an episode of dizziness with visual changes>>followed with PCP with carotid dopplers performed which showed 1-39% bilaterally  -On statin, ASA -No recurrent symptoms -Follow with dopplers   4. HLD: -Per PCP -Continue statin   5.  HTN: -Stable, 146/94 -Reports better readings at home  -Toprol-XL increased by PCP to 50 mg twice daily with good response   Current medicines are reviewed at length with the patient today.  The patient does not have concerns regarding medicines.  The following changes have been made:  no change  Labs/ tests ordered today include: None>>will request recent labs from PCP   Orders Placed This Encounter  Procedures  . EKG 12-Lead    Disposition:   FU with Dr. Marlou Porch in 1 year  Signed, Kathyrn Drown, NP  03/16/2020 12:18 PM    [address], [place], [place]  [address] Phone: [phone]; Fax: [phone]

## 2020-03-09 ENCOUNTER — Ambulatory Visit (HOSPITAL_COMMUNITY)
Admission: RE | Admit: 2020-03-09 | Discharge: 2020-03-09 | Disposition: A | Discharge status: Home / Self Care | Payer: Medicare HMO | Source: Ambulatory Visit | Attending: Surgery | Admitting: Surgery

## 2020-03-09 ENCOUNTER — Other Ambulatory Visit (HOSPITAL_COMMUNITY): Payer: Self-pay | Admitting: Internal Medicine

## 2020-03-09 ENCOUNTER — Other Ambulatory Visit: Payer: Self-pay

## 2020-03-09 DIAGNOSIS — H539 Unspecified visual disturbance: Secondary | ICD-10-CM | POA: Insufficient documentation

## 2020-03-16 ENCOUNTER — Encounter: Payer: Self-pay | Admitting: Cardiology

## 2020-03-16 ENCOUNTER — Ambulatory Visit: Payer: Medicare HMO | Admitting: Cardiology

## 2020-03-16 ENCOUNTER — Other Ambulatory Visit: Payer: Self-pay

## 2020-03-16 VITALS — BP 146/94 | HR 67 | Ht 63.0 in | Wt 139.1 lb

## 2020-03-16 DIAGNOSIS — E7841 Elevated Lipoprotein(a): Secondary | ICD-10-CM | POA: Diagnosis not present

## 2020-03-16 DIAGNOSIS — I6523 Occlusion and stenosis of bilateral carotid arteries: Secondary | ICD-10-CM | POA: Diagnosis not present

## 2020-03-16 DIAGNOSIS — I471 Supraventricular tachycardia: Secondary | ICD-10-CM | POA: Diagnosis not present

## 2020-03-16 DIAGNOSIS — I10 Essential (primary) hypertension: Secondary | ICD-10-CM

## 2020-03-16 NOTE — Patient Instructions (Signed)
Medication Instructions:   Your physician recommends that you continue on your current medications as directed. Please refer to the Current Medication list given to you today.  *If you need a refill on your cardiac medications before your next appointment, please call your pharmacy*  Lab Work:  None ordered today  Testing/Procedures:  None ordered today  Follow-Up: At [facility], you and your health needs are our priority.  As part of our continuing mission to provide you with exceptional heart care, we have created designated Provider Care Teams.  These Care Teams include your primary Cardiologist (physician) and Advanced Practice Providers (APPs -  Physician Assistants and Nurse Practitioners) who all work together to provide you with the care you need, when you need it.  We recommend signing up for the patient portal called "MyChart".  Sign up information is provided on this After Visit Summary.  MyChart is used to connect with patients for Virtual Visits (Telemedicine).  Patients are able to view lab/test results, encounter notes, upcoming appointments, etc.  Non-urgent messages can be sent to your provider as well.   To learn more about what you can do with MyChart, go to [email or web address].    Your next appointment:   12 month(s)  The format for your next appointment:   In Person  Provider:   Candee Furbish, MD

## 2020-05-22 DIAGNOSIS — E039 Hypothyroidism, unspecified: Secondary | ICD-10-CM | POA: Diagnosis not present

## 2020-05-22 DIAGNOSIS — I10 Essential (primary) hypertension: Secondary | ICD-10-CM | POA: Diagnosis not present

## 2020-05-22 DIAGNOSIS — F419 Anxiety disorder, unspecified: Secondary | ICD-10-CM | POA: Diagnosis not present

## 2020-05-22 DIAGNOSIS — G4700 Insomnia, unspecified: Secondary | ICD-10-CM | POA: Diagnosis not present

## 2020-05-26 DIAGNOSIS — M545 Low back pain: Secondary | ICD-10-CM | POA: Diagnosis not present

## 2020-05-26 DIAGNOSIS — M4854XA Collapsed vertebra, not elsewhere classified, thoracic region, initial encounter for fracture: Secondary | ICD-10-CM | POA: Diagnosis not present

## 2020-05-27 DIAGNOSIS — M545 Low back pain: Secondary | ICD-10-CM | POA: Diagnosis not present

## 2020-06-02 DIAGNOSIS — M545 Low back pain: Secondary | ICD-10-CM | POA: Diagnosis not present

## 2020-06-02 DIAGNOSIS — T148XXD Other injury of unspecified body region, subsequent encounter: Secondary | ICD-10-CM | POA: Diagnosis not present

## 2020-06-06 ENCOUNTER — Other Ambulatory Visit: Payer: Self-pay | Admitting: Cardiology

## 2020-07-03 DIAGNOSIS — T148XXD Other injury of unspecified body region, subsequent encounter: Secondary | ICD-10-CM | POA: Diagnosis not present

## 2020-08-01 DIAGNOSIS — T148XXD Other injury of unspecified body region, subsequent encounter: Secondary | ICD-10-CM | POA: Diagnosis not present

## 2020-08-29 DIAGNOSIS — M545 Low back pain, unspecified: Secondary | ICD-10-CM | POA: Diagnosis not present

## 2020-08-29 DIAGNOSIS — T148XXD Other injury of unspecified body region, subsequent encounter: Secondary | ICD-10-CM | POA: Diagnosis not present

## 2020-08-31 DIAGNOSIS — Z570 Occupational exposure to noise: Secondary | ICD-10-CM | POA: Diagnosis not present

## 2020-08-31 DIAGNOSIS — H9313 Tinnitus, bilateral: Secondary | ICD-10-CM | POA: Diagnosis not present

## 2020-08-31 DIAGNOSIS — H903 Sensorineural hearing loss, bilateral: Secondary | ICD-10-CM | POA: Diagnosis not present

## 2020-08-31 DIAGNOSIS — E039 Hypothyroidism, unspecified: Secondary | ICD-10-CM | POA: Diagnosis not present

## 2020-09-04 DIAGNOSIS — M40209 Unspecified kyphosis, site unspecified: Secondary | ICD-10-CM | POA: Diagnosis not present

## 2020-09-04 DIAGNOSIS — Z23 Encounter for immunization: Secondary | ICD-10-CM | POA: Diagnosis not present

## 2020-09-04 DIAGNOSIS — R935 Abnormal findings on diagnostic imaging of other abdominal regions, including retroperitoneum: Secondary | ICD-10-CM | POA: Diagnosis not present

## 2020-09-04 DIAGNOSIS — I10 Essential (primary) hypertension: Secondary | ICD-10-CM | POA: Diagnosis not present

## 2020-09-04 DIAGNOSIS — M810 Age-related osteoporosis without current pathological fracture: Secondary | ICD-10-CM | POA: Diagnosis not present

## 2020-09-05 DIAGNOSIS — R293 Abnormal posture: Secondary | ICD-10-CM | POA: Diagnosis not present

## 2020-09-05 DIAGNOSIS — R269 Unspecified abnormalities of gait and mobility: Secondary | ICD-10-CM | POA: Diagnosis not present

## 2020-09-05 DIAGNOSIS — M2569 Stiffness of other specified joint, not elsewhere classified: Secondary | ICD-10-CM | POA: Diagnosis not present

## 2020-09-05 DIAGNOSIS — M545 Low back pain, unspecified: Secondary | ICD-10-CM | POA: Diagnosis not present

## 2020-09-12 DIAGNOSIS — M546 Pain in thoracic spine: Secondary | ICD-10-CM | POA: Diagnosis not present

## 2020-09-12 DIAGNOSIS — M5459 Other low back pain: Secondary | ICD-10-CM | POA: Diagnosis not present

## 2020-09-22 ENCOUNTER — Other Ambulatory Visit: Payer: Self-pay | Admitting: Internal Medicine

## 2020-09-22 DIAGNOSIS — M799 Soft tissue disorder, unspecified: Secondary | ICD-10-CM

## 2020-09-28 DIAGNOSIS — M5459 Other low back pain: Secondary | ICD-10-CM | POA: Diagnosis not present

## 2020-09-28 DIAGNOSIS — M545 Low back pain, unspecified: Secondary | ICD-10-CM | POA: Diagnosis not present

## 2020-09-28 DIAGNOSIS — M546 Pain in thoracic spine: Secondary | ICD-10-CM | POA: Diagnosis not present

## 2020-10-03 ENCOUNTER — Ambulatory Visit
Admission: RE | Admit: 2020-10-03 | Discharge: 2020-10-03 | Disposition: A | Discharge status: Home / Self Care | Payer: Medicare HMO | Source: Ambulatory Visit | Attending: Internal Medicine | Admitting: Internal Medicine

## 2020-10-03 DIAGNOSIS — N838 Other noninflammatory disorders of ovary, fallopian tube and broad ligament: Secondary | ICD-10-CM | POA: Diagnosis not present

## 2020-10-03 DIAGNOSIS — N83292 Other ovarian cyst, left side: Secondary | ICD-10-CM | POA: Diagnosis not present

## 2020-10-03 DIAGNOSIS — Z90710 Acquired absence of both cervix and uterus: Secondary | ICD-10-CM | POA: Diagnosis not present

## 2020-10-03 DIAGNOSIS — M799 Soft tissue disorder, unspecified: Secondary | ICD-10-CM

## 2020-10-30 DIAGNOSIS — R1900 Intra-abdominal and pelvic swelling, mass and lump, unspecified site: Secondary | ICD-10-CM | POA: Diagnosis not present

## 2020-11-14 ENCOUNTER — Ambulatory Visit: Payer: Medicare HMO | Admitting: Cardiology

## 2020-11-14 ENCOUNTER — Other Ambulatory Visit: Payer: Self-pay

## 2020-11-14 ENCOUNTER — Encounter: Payer: Self-pay | Admitting: Cardiology

## 2020-11-14 VITALS — BP 150/80 | HR 60 | Ht 63.0 in | Wt 131.0 lb

## 2020-11-14 DIAGNOSIS — R002 Palpitations: Secondary | ICD-10-CM

## 2020-11-14 DIAGNOSIS — I6523 Occlusion and stenosis of bilateral carotid arteries: Secondary | ICD-10-CM | POA: Diagnosis not present

## 2020-11-14 DIAGNOSIS — I10 Essential (primary) hypertension: Secondary | ICD-10-CM | POA: Diagnosis not present

## 2020-11-14 DIAGNOSIS — I4719 Other supraventricular tachycardia: Secondary | ICD-10-CM

## 2020-11-14 DIAGNOSIS — I471 Supraventricular tachycardia: Secondary | ICD-10-CM

## 2020-11-14 NOTE — Patient Instructions (Addendum)
Medication Instructions:  The current medical regimen is effective;  continue present plan and medications.  *If you need a refill on your cardiac medications before your next appointment, please call your pharmacy*  Follow-Up: At [facility], you and your health needs are our priority.  As part of our continuing mission to provide you with exceptional heart care, we have created designated Provider Care Teams.  These Care Teams include your primary Cardiologist (physician) and Advanced Practice Providers (APPs -  Physician Assistants and Nurse Practitioners) who all work together to provide you with the care you need, when you need it.  We recommend signing up for the patient portal called "MyChart".  Sign up information is provided on this After Visit Summary.  MyChart is used to connect with patients for Virtual Visits (Telemedicine).  Patients are able to view lab/test results, encounter notes, upcoming appointments, etc.  Non-urgent messages can be sent to your provider as well.   To learn more about what you can do with MyChart, go to [email or web address].    Your next appointment:   12 month(s)  The format for your next appointment:   In Person  Provider:   Mark Skains, MD   Thank you for choosing [facility]!!      

## 2020-11-14 NOTE — Progress Notes (Signed)
Cardiology Office Note:    Date:  11/14/2020   ID:  Karina [name], DOB [birth year], MRN [id number]  PCP:  Geoffry Paradise, MD  [facility] Cardiologist:  Donato Schultz, MD  [facility] Electrophysiologist:  None   Referring MD: Geoffry Paradise, MD     History of Present Illness:    Karina [name] is a 87 y.o. female here for the follow-up of paroxysmal atrial tachycardia, palpitations.  Former patient of Dr. Myrtis Ser.  Normal LV.  Stress test in 2004 was normal.  Previously described a fluttering sensation in her chest.  Sometimes lasting 30 seconds to 1 minute.  No family history of CAD.    Past Medical History:  Diagnosis Date  . Anemia    iron deficiency  . ANEMIA, CHRONIC 08/13/2006  . Arthritis    left knee  . Atrophic vaginitis 10/03/2013  . CHICKENPOX, HX OF 01/28/2011  . CONSTIPATION 01/28/2011  . Depression with anxiety 03-27-2012   Husband, [name] died in August 03, 2011 after 58 years of marriage  Lost a 44 year old daughter to cancer   . DIVERTICULOSIS, COLON 01/28/2011  . Dyslipidemia   . Ejection fraction    EF 55%, echo, 2007  . Endometriosis   . Fatigue 1/11   w/ exercise.. responded to increased beta blocker dose  . GERD 12/28/2007  . GERD (gastroesophageal reflux disease)   . Grief reaction 27-Mar-2012  . HTN (hypertension) 03/06/2011  . Hypercholesteremia 2012/03/27  . HYPERLIPIDEMIA 12/17/2007  . Lesion of external ear 03/06/2011  . Lichen sclerosus et atrophicus of the vulva 08/10/2013  . MEASLES, HX OF 01/28/2011  . Medicare annual wellness visit, subsequent 05/09/2014  . Normal cardiac stress test    Nuclear, 2004, normal  . OAB (overactive bladder) 12/17/2015  . Osteoporosis   . OSTEOPOROSIS 08/13/2006  . Other specified cardiac dysrhythmias(427.89) 09/17/2010  . Post menopausal problems 03/06/2011  . Preventative health care 12/17/2015  . Sinus tachycardia    mild-- retesting  . Sinusitis 12/16/2011  . Sinusitis, acute 11/02/2013  . SVT  (supraventricular tachycardia) ([facility]) 2007   3-4 beats.. Holter    Past Surgical History:  Procedure Laterality Date  . ABDOMINAL HYSTERECTOMY     w/ unilateral oophorectomy-bleeding, ovarian cysts, left ovary left in place?  Marland Kitchen BREAST SURGERY     Implants--Silicone  . right knee arthroscopy, cleaned      Current Medications: Current Meds  Medication Sig  . ALPRAZolam (XANAX) 0.5 MG tablet Take 1 tablet (0.5 mg total) by mouth as needed for sleep or anxiety. 1/2 to 2 tabs po bid prn for anxiety, insomnia  . aspirin 81 MG tablet Take 81 mg by mouth daily.  . Cholecalciferol (VITAMIN D-3 PO) Take 5,000 Units by mouth daily.  . clobetasol cream (TEMOVATE) 0.05 %   . conjugated estrogens (PREMARIN) vaginal cream Place 1 Applicatorful vaginally. 1/4 gram per vagina once weekly.  Marland Kitchen diltiazem (CARDIZEM CD) 240 MG 24 hr capsule TAKE 1 CAPSULE (240 MG TOTAL) BY MOUTH DAILY.  . diphenhydramine-acetaminophen (TYLENOL PM) 25-500 MG TABS Take 1 tablet by mouth at bedtime as needed (pain/sleep.).   Marland Kitchen levothyroxine (SYNTHROID) 50 MCG tablet Take 1 tablet by mouth daily.  . meloxicam (MOBIC) 15 MG tablet Take 15 mg by mouth daily.  . metoprolol tartrate (LOPRESSOR) 50 MG tablet Take 1 tablet by mouth 2 (two) times a day.  . pantoprazole (PROTONIX) 40 MG tablet Take 40 mg by mouth daily.  . simvastatin (ZOCOR) 10 MG tablet  Take 1 tablet (10 mg total) by mouth daily at 6 PM.     Allergies:   Patient has no known allergies.   Social History   Socioeconomic History  . Marital status: Widowed    Spouse name: Not on file  . Number of children: Not on file  . Years of education: Not on file  . Highest education level: Not on file  Occupational History  . Not on file  Tobacco Use  . Smoking status: Never Smoker  . Smokeless tobacco: Never Used  Substance and Sexual Activity  . Alcohol use: No    Alcohol/week: 0.0 standard drinks  . Drug use: No  . Sexual activity: Never    Partners: Male     Birth control/protection: Surgical    Comment: TAH--Poss. left ovary remains  Other Topics Concern  . Not on file  Social History Narrative  . Not on file   Social Determinants of Health   Financial Resource Strain: Not on file  Food Insecurity: Not on file  Transportation Needs: Not on file  Physical Activity: Not on file  Stress: Not on file  Social Connections: Not on file     Family History: The patient's family history includes Alcohol abuse in her brother; Cancer in her brother, father, paternal aunt, paternal uncle, and sister; Cancer (age of onset: 29) in her mother; Heart attack in her brother and paternal uncle; Hypertension in her mother and son; Irritable bowel syndrome in her daughter; Lymphoma in her brother; Migraines in her daughter; Mitral valve prolapse in her daughter; Other in her maternal grandmother and mother; Stroke in her daughter and mother; Transient ischemic attack in her mother.  ROS:   Please see the history of present illness.     All other systems reviewed and are negative.  EKGs/Labs/Other Studies Reviewed:    Holter 2020:   Brief episodes of paroxysmal atrial tachycardia noted on monitor. No pauses. No atrial fibrillation.  Occasional PVCs, occasionally in bigeminal pattern.  Predominant rhythm sinus. Overall reassuring.   Paroxysmal atrial tachycardia and PVCs responsible for sensation of palpitations.  EKG: EKG from 03/16/2020 shows sinus rhythm 67  Recent Labs: No results found for requested labs within last 8760 hours.  Recent Lipid Panel    Component Value Date/Time   CHOL 206 (H) 06/10/2016 1405   TRIG 187.0 (H) 06/10/2016 1405   HDL 65.80 06/10/2016 1405   CHOLHDL 3 06/10/2016 1405   VLDL 37.4 06/10/2016 1405   LDLCALC 103 (H) 06/10/2016 1405   LDLDIRECT 156.5 03/10/2012 0921     Risk Assessment/Calculations:       Physical Exam:    VS:  BP (!) 150/80   Pulse 60   Ht 5\' 3"  (1.6 m)   Wt 131 lb (59.4 kg)   SpO2 96%    BMI 23.21 kg/m     Wt Readings from Last 3 Encounters:  11/14/20 131 lb (59.4 kg)  03/16/20 139 lb 1.9 oz (63.1 kg)  06/29/19 138 lb (62.6 kg)     GEN:  Well nourished, well developed in no acute distress HEENT: Normal NECK: No JVD; No carotid bruits LYMPHATICS: No lymphadenopathy CARDIAC: RRR, no murmurs, rubs, gallops RESPIRATORY:  Clear to auscultation without rales, wheezing or rhonchi  ABDOMEN: Soft, non-tender, non-distended MUSCULOSKELETAL:  No edema; No deformity  SKIN: Warm and dry NEUROLOGIC:  Alert and oriented x 3 PSYCHIATRIC:  Normal affect   ASSESSMENT:    1. Paroxysmal atrial tachycardia (HCC)   2. Essential  hypertension   3. Palpitations   4. Bilateral carotid artery stenosis    PLAN:    In order of problems listed above:   Paroxysmal atrial tachycardia - Holter monitor no atrial fibrillation.    Only paroxysmal atrial tachycardia was noted.  No anticoagulation.  Placed on increased Toprol.  Diltiazem.  Doing well.  No changes made.  Today, we discussed the differences between atrial fibrillation and paroxysmal atrial tachycardia.  Palpitations - Prior EKG normal sinus rhythm 73 reviewed.  Two-week monitor was placed.  Toprol 50 mg twice a day prescribed.  Paroxysmal atrial tachycardia noted, PVCs also noted.  Seems to be doing quite well with this.  Occasionally she will feel rare episodes of palpitations.  PVCs -Okay to continue with Toprol and diltiazem  Essential hypertension - Elevated here in the doctor's office, whitecoat component.  Usually normal at home.  No changes made.  Carotid plaque -Mild bilateral.  Okay to utilize aspirin 81 mg in the situation.  She said no signs of bleeding.  Last hemoglobin outside labs 11.7.  Creatinine last checked 1.6-be careful with NSAIDs   1 year follow-up  Medication Adjustments/Labs and Tests Ordered: Current medicines are reviewed at length with the patient today.  Concerns regarding medicines are  outlined above.  No orders of the defined types were placed in this encounter.  No orders of the defined types were placed in this encounter.   Patient Instructions  Medication Instructions:  The current medical regimen is effective;  continue present plan and medications.  *If you need a refill on your cardiac medications before your next appointment, please call your pharmacy*  Follow-Up: At [facility], you and your health needs are our priority.  As part of our continuing mission to provide you with exceptional heart care, we have created designated Provider Care Teams.  These Care Teams include your primary Cardiologist (physician) and Advanced Practice Providers (APPs -  Physician Assistants and Nurse Practitioners) who all work together to provide you with the care you need, when you need it.  We recommend signing up for the patient portal called "MyChart".  Sign up information is provided on this After Visit Summary.  MyChart is used to connect with patients for Virtual Visits (Telemedicine).  Patients are able to view lab/test results, encounter notes, upcoming appointments, etc.  Non-urgent messages can be sent to your provider as well.   To learn more about what you can do with MyChart, go to [email or web address].    Your next appointment:   12 month(s)  The format for your next appointment:   In Person  Provider:   Candee Furbish, MD  Thank you for choosing [facility]!!          Signed, Candee Furbish, MD  11/14/2020 3:39 PM    [place]

## 2020-12-29 DIAGNOSIS — Z1231 Encounter for screening mammogram for malignant neoplasm of breast: Secondary | ICD-10-CM | POA: Diagnosis not present

## 2021-03-02 DIAGNOSIS — N83292 Other ovarian cyst, left side: Secondary | ICD-10-CM | POA: Diagnosis not present

## 2021-03-02 DIAGNOSIS — R1900 Intra-abdominal and pelvic swelling, mass and lump, unspecified site: Secondary | ICD-10-CM | POA: Diagnosis not present

## 2021-03-07 DIAGNOSIS — M810 Age-related osteoporosis without current pathological fracture: Secondary | ICD-10-CM | POA: Diagnosis not present

## 2021-03-07 DIAGNOSIS — E785 Hyperlipidemia, unspecified: Secondary | ICD-10-CM | POA: Diagnosis not present

## 2021-03-07 DIAGNOSIS — E039 Hypothyroidism, unspecified: Secondary | ICD-10-CM | POA: Diagnosis not present

## 2021-03-14 DIAGNOSIS — E039 Hypothyroidism, unspecified: Secondary | ICD-10-CM | POA: Diagnosis not present

## 2021-03-14 DIAGNOSIS — Z1339 Encounter for screening examination for other mental health and behavioral disorders: Secondary | ICD-10-CM | POA: Diagnosis not present

## 2021-03-14 DIAGNOSIS — R52 Pain, unspecified: Secondary | ICD-10-CM | POA: Diagnosis not present

## 2021-03-14 DIAGNOSIS — Z1331 Encounter for screening for depression: Secondary | ICD-10-CM | POA: Diagnosis not present

## 2021-03-14 DIAGNOSIS — M40209 Unspecified kyphosis, site unspecified: Secondary | ICD-10-CM | POA: Diagnosis not present

## 2021-03-14 DIAGNOSIS — I10 Essential (primary) hypertension: Secondary | ICD-10-CM | POA: Diagnosis not present

## 2021-03-14 DIAGNOSIS — R82998 Other abnormal findings in urine: Secondary | ICD-10-CM | POA: Diagnosis not present

## 2021-03-14 DIAGNOSIS — Z0000 Encounter for general adult medical examination without abnormal findings: Secondary | ICD-10-CM | POA: Diagnosis not present

## 2021-03-14 DIAGNOSIS — M810 Age-related osteoporosis without current pathological fracture: Secondary | ICD-10-CM | POA: Diagnosis not present

## 2021-03-14 DIAGNOSIS — K219 Gastro-esophageal reflux disease without esophagitis: Secondary | ICD-10-CM | POA: Diagnosis not present

## 2021-03-14 DIAGNOSIS — E785 Hyperlipidemia, unspecified: Secondary | ICD-10-CM | POA: Diagnosis not present

## 2021-03-14 DIAGNOSIS — F419 Anxiety disorder, unspecified: Secondary | ICD-10-CM | POA: Diagnosis not present

## 2021-06-06 DIAGNOSIS — I10 Essential (primary) hypertension: Secondary | ICD-10-CM | POA: Diagnosis not present

## 2021-07-06 NOTE — Progress Notes (Signed)
Cardiology Office Note:    Date:  07/10/2021   ID:  Karina [name], Karina [name] 1933/10/28, MRN [id number]  PCP:  Burnard Bunting, MD  [facility] Cardiologist:  Candee Furbish, MD  [facility] Electrophysiologist:  None   Referring MD: Burnard Bunting, MD     History of Present Illness:    Karina [name] is a 87 y.o. female here for the follow-up of paroxysmal atrial tachycardia, palpitations, and the evaluation of irregular heartbeat and fatigue. Former patient of Dr. Ron [name].  Normal LV.  Stress test in 2004 was normal.  Previously described a fluttering sensation in her chest.  Sometimes lasting 30 seconds to 1 minute.  No family history of CAD.  Today, noted Sunday 20 minutes beating fast. Makes her feel like insides are trembling. Sits down.   She denies any palpitations, chest pain, or shortness of breath. No lightheadedness, headaches, syncope, orthopnea, or PND. Also has no lower extremity edema or exertional symptoms.   Past Medical History:  Diagnosis Date   Anemia    iron deficiency   ANEMIA, CHRONIC 08/13/2006   Arthritis    left knee   Atrophic vaginitis 10/03/2013   CHICKENPOX, HX OF 01/28/2011   CONSTIPATION 01/28/2011   Depression with anxiety 03/30/12   Husband, [name] died in 2011/08/20 after 58 years of marriage  Lost a 36 year old daughter to cancer    DIVERTICULOSIS, COLON 01/28/2011   Dyslipidemia    Ejection fraction    EF 55%, echo, 2007   Endometriosis    Fatigue 1/11   w/ exercise.. responded to increased beta blocker dose   GERD 12/28/2007   GERD (gastroesophageal reflux disease)    Grief reaction 03-30-12   HTN (hypertension) 03/06/2011   Hypercholesteremia 2012/03/30   HYPERLIPIDEMIA 12/17/2007   Lesion of external ear 123456   Lichen sclerosus et atrophicus of the vulva 08/10/2013   MEASLES, HX OF 01/28/2011   Medicare annual wellness visit, subsequent 05/09/2014   Normal cardiac stress test    Nuclear, 2004, normal   OAB (overactive  bladder) 12/17/2015   Osteoporosis    OSTEOPOROSIS 08/13/2006   Other specified cardiac dysrhythmias(427.89) 09/17/2010   Post menopausal problems 03/06/2011   Preventative health care 12/17/2015   Sinus tachycardia    mild-- retesting   Sinusitis 12/16/2011   Sinusitis, acute 11/02/2013   SVT (supraventricular tachycardia) ([place]) 2007   3-4 beats.. Holter    Past Surgical History:  Procedure Laterality Date   ABDOMINAL HYSTERECTOMY     w/ unilateral oophorectomy-bleeding, ovarian cysts, left ovary left in place?   BREAST SURGERY     Implants--Silicone   right knee arthroscopy, cleaned      Current Medications: Current Meds  Medication Sig   ALPRAZolam (XANAX) 0.5 MG tablet Take 1 tablet (0.5 mg total) by mouth as needed for sleep or anxiety. 1/2 to 2 tabs po bid prn for anxiety, insomnia   aspirin 81 MG tablet Take 81 mg by mouth daily.   Cholecalciferol (VITAMIN D-3 PO) Take 5,000 Units by mouth daily.   clobetasol cream (TEMOVATE) 0.05 %    Coenzyme Q10 (CO Q-10) 50 MG CAPS Take by mouth daily.   conjugated estrogens (PREMARIN) vaginal cream Place 1 Applicatorful vaginally. 1/4 gram per vagina once weekly.   diltiazem (CARDIZEM CD) 240 MG 24 hr capsule TAKE 1 CAPSULE (240 MG TOTAL) BY MOUTH DAILY.   diphenhydramine-acetaminophen (TYLENOL PM) 25-500 MG TABS Take 1 tablet by mouth at bedtime as needed (pain/sleep.).  levothyroxine (SYNTHROID) 75 MCG tablet TAKE 1 TABLET EVERY DAY ON AN EMPTY STOMACH   meloxicam (MOBIC) 15 MG tablet Take 15 mg by mouth daily.   metoprolol succinate (TOPROL-XL) 50 MG 24 hr tablet Take 1 tablet by mouth 2 (two) times daily.   Multiple Vitamins-Minerals (ALIVE WOMENS 50+) TABS one tablet PO daily   pantoprazole (PROTONIX) 40 MG tablet Take 40 mg by mouth daily.   simvastatin (ZOCOR) 20 MG tablet Take 1 tablet by mouth daily.     Allergies:   Patient has no known allergies.   Social History   Socioeconomic History   Marital status: Widowed     Spouse name: Not on file   Number of children: Not on file   Years of education: Not on file   Highest education level: Not on file  Occupational History   Not on file  Tobacco Use   Smoking status: Never   Smokeless tobacco: Never  Substance and Sexual Activity   Alcohol use: No    Alcohol/week: 0.0 standard drinks   Drug use: No   Sexual activity: Never    Partners: Male    Birth control/protection: Surgical    Comment: TAH--Poss. left ovary remains  Other Topics Concern   Not on file  Social History Narrative   Not on file   Social Determinants of Health   Financial Resource Strain: Not on file  Food Insecurity: Not on file  Transportation Needs: Not on file  Physical Activity: Not on file  Stress: Not on file  Social Connections: Not on file     Family History: The patient's family history includes Alcohol abuse in her brother; Cancer in her brother, father, paternal aunt, paternal uncle, and sister; Cancer (age of onset: 39) in her mother; Heart attack in her brother and paternal uncle; Hypertension in her mother and son; Irritable bowel syndrome in her daughter; Lymphoma in her brother; Migraines in her daughter; Mitral valve prolapse in her daughter; Other in her maternal grandmother and mother; Stroke in her daughter and mother; Transient ischemic attack in her mother.  ROS:   Please see the history of present illness.    (+) All other systems reviewed and are negative.  EKGs/Labs/Other Studies Reviewed:    US Carotid Duplex 03/09/2020: Summary:  Right Carotid: Velocities in the right ICA are consistent with a 1-39%  stenosis.   Left Carotid: Velocities in the left ICA are consistent with a 1-39%  stenosis.   Vertebrals:  Bilateral vertebral arteries demonstrate antegrade flow.  Subclavians: Normal flow hemodynamics were seen in bilateral subclavian arteries.   Echo 06/01/2019:  1. The left ventricle has normal systolic function with an ejection  fraction  of 60-65%. The cavity size was normal. Left ventricular diastolic  Doppler parameters are consistent with impaired relaxation. No evidence of  left ventricular regional wall  motion abnormalities.   2. The right ventricle has normal systolic function. The cavity was  normal. There is no increase in right ventricular wall thickness.   3. There is mild mitral annular calcification present.   4. The aortic valve is tricuspid. Aortic valve regurgitation is mild by  color flow Doppler.   Holter 2020:  Brief episodes of paroxysmal atrial tachycardia noted on monitor. No pauses. No atrial fibrillation. Occasional PVCs, occasionally in bigeminal pattern. Predominant rhythm sinus. Overall reassuring.   Paroxysmal atrial tachycardia and PVCs responsible for sensation of palpitations.  EKG: EKG is personally reviewed and interpreted.  07/10/2021: Sinus rhythm 64 with  no other abnormalities.  Normal intervals. 03/16/2020: sinus rhythm 67  Recent Labs: No results found for requested labs within last 8760 hours.  Recent Lipid Panel    Component Value Date/Time   CHOL 206 (H) 06/10/2016 1405   TRIG 187.0 (H) 06/10/2016 1405   HDL 65.80 06/10/2016 1405   CHOLHDL 3 06/10/2016 1405   VLDL 37.4 06/10/2016 1405   LDLCALC 103 (H) 06/10/2016 1405   LDLDIRECT 156.5 03/10/2012 0921     Risk Assessment/Calculations:       Physical Exam:    VS:  BP 120/80   Pulse 64   Ht '5\' 3"'$  (1.6 m)   Wt 130 lb 3.2 oz (59.1 kg)   SpO2 94%   BMI 23.06 kg/m     Wt Readings from Last 3 Encounters:  07/10/21 130 lb 3.2 oz (59.1 kg)  11/14/20 131 lb (59.4 kg)  03/16/20 139 lb 1.9 oz (63.1 kg)     GEN: Well nourished, well developed in no acute distress HEENT: Normal NECK: No JVD; No carotid bruits LYMPHATICS: No lymphadenopathy CARDIAC: RRR, no murmurs, rubs, gallops RESPIRATORY:  Clear to auscultation without rales, wheezing or rhonchi  ABDOMEN: Soft, non-tender, non-distended MUSCULOSKELETAL:  No  edema; No deformity  SKIN: Warm and dry NEUROLOGIC:  Alert and oriented x 3 PSYCHIATRIC:  Normal affect     ASSESSMENT:    1. Essential hypertension   2. Paroxysmal atrial tachycardia ([place])   3. SVT (supraventricular tachycardia) ([place])   4. Bilateral carotid artery stenosis   5. Primary hypertension   6. Pure hypercholesterolemia     PLAN:    In order of problems listed above: SVT (supraventricular tachycardia) ([place]) Occasionally will have episodes of "racing " fluttering like feeling.  Had 1 this past Sunday, sat down in 20 minutes it subsided.  I told her that sitting down as well as drinking additional water can be helpful.  She can if it prolongs take an additional metoprolol if needed.  She will let me know if this is getting worse.  Previous Holter monitor in 2020 was reviewed.  There were no evidence of atrial fibrillation.  Continue with diltiazem 240 mg long-acting as well as Toprol 50 mg for medical management.  Refills as needed.   Carotid artery disease (HCC) Mild carotid artery plaque bilaterally.  Previous scan reviewed.  Currently on simvastatin 20 mg daily.  Prior LDL 138.  She is on aspirin 81 mg.  No bleeding.  Continue with current medical management.  HTN (hypertension) Sometimes elevated in the doctor's office.  Today doing well.  No changes made.  Medications reviewed.  Hyperlipidemia Continue with simvastatin 20 mg daily.  Prior LDL 138.  Would be optimal for her LDL to be less than 70.  1 year follow-up  Medication Adjustments/Labs and Tests Ordered: Current medicines are reviewed at length with the patient today.  Concerns regarding medicines are outlined above.  Orders Placed This Encounter  Procedures   EKG 12-Lead    No orders of the defined types were placed in this encounter.   Patient Instructions  Medication Instructions:  The current medical regimen is effective;  continue present plan and medications.  *If you need a refill on your  cardiac medications before your next appointment, please call your pharmacy*  Follow-Up: At [facility], you and your health needs are our priority.  As part of our continuing mission to provide you with exceptional heart care, we have created designated Provider Care Teams.  These Care  Teams include your primary Cardiologist (physician) and Advanced Practice Providers (APPs -  Physician Assistants and Nurse Practitioners) who all work together to provide you with the care you need, when you need it.  We recommend signing up for the patient portal called "MyChart".  Sign up information is provided on this After Visit Summary.  MyChart is used to connect with patients for Virtual Visits (Telemedicine).  Patients are able to view lab/test results, encounter notes, upcoming appointments, etc.  Non-urgent messages can be sent to your provider as well.   To learn more about what you can do with MyChart, go to [email or web address].    Your next appointment:   1 year(s)  The format for your next appointment:   In Person  Provider:   Candee Furbish, MD   Thank you for choosing [facility]!!      Signed, Candee Furbish, MD  07/10/2021 10:07 AM    [place]

## 2021-07-10 ENCOUNTER — Other Ambulatory Visit: Payer: Self-pay

## 2021-07-10 ENCOUNTER — Ambulatory Visit: Payer: Medicare HMO | Admitting: Cardiology

## 2021-07-10 ENCOUNTER — Encounter: Payer: Self-pay | Admitting: Cardiology

## 2021-07-10 VITALS — BP 120/80 | HR 64 | Ht 63.0 in | Wt 130.2 lb

## 2021-07-10 DIAGNOSIS — I471 Supraventricular tachycardia: Secondary | ICD-10-CM

## 2021-07-10 DIAGNOSIS — I10 Essential (primary) hypertension: Secondary | ICD-10-CM | POA: Diagnosis not present

## 2021-07-10 DIAGNOSIS — E7800 Pure hypercholesterolemia, unspecified: Secondary | ICD-10-CM | POA: Diagnosis not present

## 2021-07-10 DIAGNOSIS — I6523 Occlusion and stenosis of bilateral carotid arteries: Secondary | ICD-10-CM | POA: Diagnosis not present

## 2021-07-10 DIAGNOSIS — I779 Disorder of arteries and arterioles, unspecified: Secondary | ICD-10-CM | POA: Insufficient documentation

## 2021-07-10 NOTE — Assessment & Plan Note (Signed)
Mild carotid artery plaque bilaterally.  Previous scan reviewed.  Currently on simvastatin 20 mg daily.  Prior LDL 138.  She is on aspirin 81 mg.  No bleeding.  Continue with current medical management.

## 2021-07-10 NOTE — Assessment & Plan Note (Signed)
Continue with simvastatin 20 mg daily.  Prior LDL 138.  Would be optimal for her LDL to be less than 70.

## 2021-07-10 NOTE — Assessment & Plan Note (Signed)
Sometimes elevated in the doctor's office.  Today doing well.  No changes made.  Medications reviewed.

## 2021-07-10 NOTE — Assessment & Plan Note (Signed)
Occasionally will have episodes of "racing " fluttering like feeling.  Had 1 this past Sunday, sat down in 20 minutes it subsided.  I told her that sitting down as well as drinking additional water can be helpful.  She can if it prolongs take an additional metoprolol if needed.  She will let me know if this is getting worse.  Previous Holter monitor in 2020 was reviewed.  There were no evidence of atrial fibrillation.  Continue with diltiazem 240 mg long-acting as well as Toprol 50 mg for medical management.  Refills as needed.

## 2021-07-10 NOTE — Patient Instructions (Signed)
Medication Instructions:  The current medical regimen is effective;  continue present plan and medications.  *If you need a refill on your cardiac medications before your next appointment, please call your pharmacy*  Follow-Up: At [facility], you and your health needs are our priority.  As part of our continuing mission to provide you with exceptional heart care, we have created designated Provider Care Teams.  These Care Teams include your primary Cardiologist (physician) and Advanced Practice Providers (APPs -  Physician Assistants and Nurse Practitioners) who all work together to provide you with the care you need, when you need it.  We recommend signing up for the patient portal called "MyChart".  Sign up information is provided on this After Visit Summary.  MyChart is used to connect with patients for Virtual Visits (Telemedicine).  Patients are able to view lab/test results, encounter notes, upcoming appointments, etc.  Non-urgent messages can be sent to your provider as well.   To learn more about what you can do with MyChart, go to [email or web address].    Your next appointment:   1 year(s)  The format for your next appointment:   In Person  Provider:   Mark Skains, MD   Thank you for choosing [facility]!!    

## 2021-08-20 DIAGNOSIS — H524 Presbyopia: Secondary | ICD-10-CM | POA: Diagnosis not present

## 2021-09-17 DIAGNOSIS — M810 Age-related osteoporosis without current pathological fracture: Secondary | ICD-10-CM | POA: Diagnosis not present

## 2021-09-17 DIAGNOSIS — E039 Hypothyroidism, unspecified: Secondary | ICD-10-CM | POA: Diagnosis not present

## 2021-09-17 DIAGNOSIS — E785 Hyperlipidemia, unspecified: Secondary | ICD-10-CM | POA: Diagnosis not present

## 2021-09-17 DIAGNOSIS — Z23 Encounter for immunization: Secondary | ICD-10-CM | POA: Diagnosis not present

## 2021-09-17 DIAGNOSIS — M40209 Unspecified kyphosis, site unspecified: Secondary | ICD-10-CM | POA: Diagnosis not present

## 2021-09-17 DIAGNOSIS — I10 Essential (primary) hypertension: Secondary | ICD-10-CM | POA: Diagnosis not present

## 2021-09-17 DIAGNOSIS — K219 Gastro-esophageal reflux disease without esophagitis: Secondary | ICD-10-CM | POA: Diagnosis not present

## 2021-12-17 DIAGNOSIS — R0981 Nasal congestion: Secondary | ICD-10-CM | POA: Diagnosis not present

## 2021-12-17 DIAGNOSIS — B009 Herpesviral infection, unspecified: Secondary | ICD-10-CM | POA: Diagnosis not present

## 2021-12-17 DIAGNOSIS — Z1152 Encounter for screening for COVID-19: Secondary | ICD-10-CM | POA: Diagnosis not present

## 2021-12-17 DIAGNOSIS — J320 Chronic maxillary sinusitis: Secondary | ICD-10-CM | POA: Diagnosis not present

## 2021-12-17 DIAGNOSIS — H04203 Unspecified epiphora, bilateral lacrimal glands: Secondary | ICD-10-CM | POA: Diagnosis not present

## 2021-12-17 DIAGNOSIS — R5383 Other fatigue: Secondary | ICD-10-CM | POA: Diagnosis not present

## 2021-12-21 DIAGNOSIS — H16143 Punctate keratitis, bilateral: Secondary | ICD-10-CM | POA: Diagnosis not present

## 2022-01-04 DIAGNOSIS — Z1231 Encounter for screening mammogram for malignant neoplasm of breast: Secondary | ICD-10-CM | POA: Diagnosis not present

## 2022-01-09 ENCOUNTER — Emergency Department (HOSPITAL_COMMUNITY): Payer: Medicare HMO

## 2022-01-09 ENCOUNTER — Inpatient Hospital Stay (HOSPITAL_COMMUNITY): Payer: Medicare HMO | Admitting: Certified Registered"

## 2022-01-09 ENCOUNTER — Inpatient Hospital Stay (HOSPITAL_COMMUNITY)
Admission: EM | Admit: 2022-01-09 | Discharge: 2022-01-16 | DRG: 854 | Disposition: A | Discharge status: Home Health | Payer: Medicare HMO | Attending: Internal Medicine | Admitting: Internal Medicine

## 2022-01-09 ENCOUNTER — Other Ambulatory Visit: Payer: Self-pay

## 2022-01-09 ENCOUNTER — Encounter (HOSPITAL_COMMUNITY): Payer: Self-pay | Admitting: Emergency Medicine

## 2022-01-09 ENCOUNTER — Encounter (HOSPITAL_COMMUNITY)
Admission: EM | Disposition: A | Discharge status: Home Health | Payer: Self-pay | Source: Home / Self Care | Attending: Internal Medicine

## 2022-01-09 DIAGNOSIS — Z807 Family history of other malignant neoplasms of lymphoid, hematopoietic and related tissues: Secondary | ICD-10-CM

## 2022-01-09 DIAGNOSIS — A419 Sepsis, unspecified organism: Secondary | ICD-10-CM | POA: Diagnosis not present

## 2022-01-09 DIAGNOSIS — E785 Hyperlipidemia, unspecified: Secondary | ICD-10-CM | POA: Diagnosis present

## 2022-01-09 DIAGNOSIS — I10 Essential (primary) hypertension: Secondary | ICD-10-CM

## 2022-01-09 DIAGNOSIS — R6520 Severe sepsis without septic shock: Secondary | ICD-10-CM | POA: Diagnosis not present

## 2022-01-09 DIAGNOSIS — E876 Hypokalemia: Secondary | ICD-10-CM

## 2022-01-09 DIAGNOSIS — R112 Nausea with vomiting, unspecified: Secondary | ICD-10-CM

## 2022-01-09 DIAGNOSIS — E039 Hypothyroidism, unspecified: Secondary | ICD-10-CM

## 2022-01-09 DIAGNOSIS — M272 Inflammatory conditions of jaws: Secondary | ICD-10-CM | POA: Diagnosis present

## 2022-01-09 DIAGNOSIS — N179 Acute kidney failure, unspecified: Secondary | ICD-10-CM | POA: Diagnosis not present

## 2022-01-09 DIAGNOSIS — I471 Supraventricular tachycardia, unspecified: Secondary | ICD-10-CM | POA: Diagnosis present

## 2022-01-09 DIAGNOSIS — A4159 Other Gram-negative sepsis: Secondary | ICD-10-CM | POA: Diagnosis not present

## 2022-01-09 DIAGNOSIS — Z791 Long term (current) use of non-steroidal anti-inflammatories (NSAID): Secondary | ICD-10-CM

## 2022-01-09 DIAGNOSIS — D649 Anemia, unspecified: Secondary | ICD-10-CM | POA: Diagnosis present

## 2022-01-09 DIAGNOSIS — Z8739 Personal history of other diseases of the musculoskeletal system and connective tissue: Secondary | ICD-10-CM | POA: Diagnosis not present

## 2022-01-09 DIAGNOSIS — L0211 Cutaneous abscess of neck: Secondary | ICD-10-CM | POA: Diagnosis not present

## 2022-01-09 DIAGNOSIS — W19XXXA Unspecified fall, initial encounter: Secondary | ICD-10-CM | POA: Diagnosis not present

## 2022-01-09 DIAGNOSIS — R000 Tachycardia, unspecified: Secondary | ICD-10-CM | POA: Diagnosis not present

## 2022-01-09 DIAGNOSIS — Z79890 Hormone replacement therapy (postmenopausal): Secondary | ICD-10-CM

## 2022-01-09 DIAGNOSIS — L0889 Other specified local infections of the skin and subcutaneous tissue: Secondary | ICD-10-CM | POA: Diagnosis not present

## 2022-01-09 DIAGNOSIS — F419 Anxiety disorder, unspecified: Secondary | ICD-10-CM | POA: Diagnosis present

## 2022-01-09 DIAGNOSIS — M609 Myositis, unspecified: Secondary | ICD-10-CM | POA: Diagnosis not present

## 2022-01-09 DIAGNOSIS — M810 Age-related osteoporosis without current pathological fracture: Secondary | ICD-10-CM | POA: Diagnosis present

## 2022-01-09 DIAGNOSIS — L03211 Cellulitis of face: Secondary | ICD-10-CM

## 2022-01-09 DIAGNOSIS — I517 Cardiomegaly: Secondary | ICD-10-CM | POA: Diagnosis not present

## 2022-01-09 DIAGNOSIS — R0689 Other abnormalities of breathing: Secondary | ICD-10-CM | POA: Diagnosis not present

## 2022-01-09 DIAGNOSIS — Z7982 Long term (current) use of aspirin: Secondary | ICD-10-CM

## 2022-01-09 DIAGNOSIS — K1120 Sialoadenitis, unspecified: Secondary | ICD-10-CM | POA: Diagnosis not present

## 2022-01-09 DIAGNOSIS — Z79899 Other long term (current) drug therapy: Secondary | ICD-10-CM

## 2022-01-09 DIAGNOSIS — Z20822 Contact with and (suspected) exposure to covid-19: Secondary | ICD-10-CM | POA: Diagnosis not present

## 2022-01-09 DIAGNOSIS — Z823 Family history of stroke: Secondary | ICD-10-CM | POA: Diagnosis not present

## 2022-01-09 DIAGNOSIS — F418 Other specified anxiety disorders: Secondary | ICD-10-CM | POA: Diagnosis not present

## 2022-01-09 DIAGNOSIS — R221 Localized swelling, mass and lump, neck: Secondary | ICD-10-CM | POA: Diagnosis not present

## 2022-01-09 DIAGNOSIS — R739 Hyperglycemia, unspecified: Secondary | ICD-10-CM | POA: Diagnosis not present

## 2022-01-09 DIAGNOSIS — F32A Depression, unspecified: Secondary | ICD-10-CM | POA: Diagnosis not present

## 2022-01-09 DIAGNOSIS — R609 Edema, unspecified: Secondary | ICD-10-CM | POA: Diagnosis not present

## 2022-01-09 DIAGNOSIS — E860 Dehydration: Secondary | ICD-10-CM | POA: Diagnosis present

## 2022-01-09 DIAGNOSIS — M7989 Other specified soft tissue disorders: Secondary | ICD-10-CM | POA: Diagnosis not present

## 2022-01-09 DIAGNOSIS — Z8249 Family history of ischemic heart disease and other diseases of the circulatory system: Secondary | ICD-10-CM | POA: Diagnosis not present

## 2022-01-09 DIAGNOSIS — R011 Cardiac murmur, unspecified: Secondary | ICD-10-CM | POA: Diagnosis not present

## 2022-01-09 DIAGNOSIS — K111 Hypertrophy of salivary gland: Secondary | ICD-10-CM | POA: Diagnosis not present

## 2022-01-09 DIAGNOSIS — K219 Gastro-esophageal reflux disease without esophagitis: Secondary | ICD-10-CM | POA: Diagnosis present

## 2022-01-09 DIAGNOSIS — D638 Anemia in other chronic diseases classified elsewhere: Secondary | ICD-10-CM | POA: Diagnosis not present

## 2022-01-09 DIAGNOSIS — M3320 Polymyositis, organ involvement unspecified: Secondary | ICD-10-CM | POA: Diagnosis not present

## 2022-01-09 DIAGNOSIS — R531 Weakness: Secondary | ICD-10-CM | POA: Diagnosis not present

## 2022-01-09 HISTORY — PX: INCISION AND DRAINAGE ABSCESS: SHX5864

## 2022-01-09 LAB — CBC WITH DIFFERENTIAL/PLATELET
Abs Immature Granulocytes: 0.23 10*3/uL — ABNORMAL HIGH (ref 0.00–0.07)
Basophils Absolute: 0.1 10*3/uL (ref 0.0–0.1)
Basophils Relative: 0 %
Eosinophils Absolute: 0.1 10*3/uL (ref 0.0–0.5)
Eosinophils Relative: 1 %
HCT: 34.8 % — ABNORMAL LOW (ref 36.0–46.0)
Hemoglobin: 11.1 g/dL — ABNORMAL LOW (ref 12.0–15.0)
Immature Granulocytes: 1 %
Lymphocytes Relative: 13 %
Lymphs Abs: 2.3 10*3/uL (ref 0.7–4.0)
MCH: 28.7 pg (ref 26.0–34.0)
MCHC: 31.9 g/dL (ref 30.0–36.0)
MCV: 89.9 fL (ref 80.0–100.0)
Monocytes Absolute: 1.4 10*3/uL — ABNORMAL HIGH (ref 0.1–1.0)
Monocytes Relative: 8 %
Neutro Abs: 13.5 10*3/uL — ABNORMAL HIGH (ref 1.7–7.7)
Neutrophils Relative %: 77 %
Platelets: 266 10*3/uL (ref 150–400)
RBC: 3.87 MIL/uL (ref 3.87–5.11)
RDW: 13.2 % (ref 11.5–15.5)
WBC: 17.6 10*3/uL — ABNORMAL HIGH (ref 4.0–10.5)
nRBC: 0 % (ref 0.0–0.2)

## 2022-01-09 LAB — COMPREHENSIVE METABOLIC PANEL
ALT: 19 U/L (ref 0–44)
AST: 26 U/L (ref 15–41)
Albumin: 3.6 g/dL (ref 3.5–5.0)
Alkaline Phosphatase: 91 U/L (ref 38–126)
Anion gap: 12 (ref 5–15)
BUN: 34 mg/dL — ABNORMAL HIGH (ref 8–23)
CO2: 23 mmol/L (ref 22–32)
Calcium: 9.3 mg/dL (ref 8.9–10.3)
Chloride: 103 mmol/L (ref 98–111)
Creatinine, Ser: 1.3 mg/dL — ABNORMAL HIGH (ref 0.44–1.00)
GFR, Estimated: 40 mL/min — ABNORMAL LOW (ref >60)
Glucose, Bld: 190 mg/dL — ABNORMAL HIGH (ref 70–99)
Potassium: 3 mmol/L — ABNORMAL LOW (ref 3.5–5.1)
Sodium: 138 mmol/L (ref 135–145)
Total Bilirubin: 0.4 mg/dL (ref 0.3–1.2)
Total Protein: 8.4 g/dL — ABNORMAL HIGH (ref 6.5–8.1)

## 2022-01-09 LAB — I-STAT CHEM 8, ED
BUN: 30 mg/dL — ABNORMAL HIGH (ref 8–23)
Calcium, Ion: 1.14 mmol/L — ABNORMAL LOW (ref 1.15–1.40)
Chloride: 105 mmol/L (ref 98–111)
Creatinine, Ser: 1.1 mg/dL — ABNORMAL HIGH (ref 0.44–1.00)
Glucose, Bld: 135 mg/dL — ABNORMAL HIGH (ref 70–99)
HCT: 32 % — ABNORMAL LOW (ref 36.0–46.0)
Hemoglobin: 10.9 g/dL — ABNORMAL LOW (ref 12.0–15.0)
Potassium: 3.2 mmol/L — ABNORMAL LOW (ref 3.5–5.1)
Sodium: 142 mmol/L (ref 135–145)
TCO2: 24 mmol/L (ref 22–32)

## 2022-01-09 LAB — PROTIME-INR
INR: 1.1 (ref 0.8–1.2)
Prothrombin Time: 13.8 seconds (ref 11.4–15.2)

## 2022-01-09 LAB — LACTIC ACID, PLASMA
Lactic Acid, Venous: 2.8 mmol/L (ref 0.5–1.9)
Lactic Acid, Venous: 1.7 mmol/L (ref 0.5–1.9)

## 2022-01-09 LAB — URINALYSIS, ROUTINE W REFLEX MICROSCOPIC
Bilirubin Urine: NEGATIVE
Glucose, UA: NEGATIVE mg/dL
Hgb urine dipstick: NEGATIVE
Ketones, ur: 5 mg/dL — AB
Leukocytes,Ua: NEGATIVE
Nitrite: NEGATIVE
Protein, ur: NEGATIVE mg/dL
Specific Gravity, Urine: 1.024 (ref 1.005–1.030)
pH: 5 (ref 5.0–8.0)

## 2022-01-09 LAB — RESP PANEL BY RT-PCR (FLU A&B, COVID) ARPGX2
Influenza A by PCR: NEGATIVE
Influenza B by PCR: NEGATIVE
SARS Coronavirus 2 by RT PCR: NEGATIVE

## 2022-01-09 LAB — APTT: aPTT: 34 seconds (ref 24–36)

## 2022-01-09 LAB — CBG MONITORING, ED: Glucose-Capillary: 200 mg/dL — ABNORMAL HIGH (ref 70–99)

## 2022-01-09 SURGERY — INCISION AND DRAINAGE, ABSCESS
Anesthesia: General | Laterality: Left

## 2022-01-09 MED ORDER — CHLORHEXIDINE GLUCONATE 0.12 % MT SOLN
15.0000 mL | OROMUCOSAL | Status: DC
  Administered 2022-01-10 – 2022-01-16 (×33): 15 mL via OROMUCOSAL
  Filled 2022-01-09 (×32): qty 15

## 2022-01-09 MED ORDER — DEXAMETHASONE SODIUM PHOSPHATE 10 MG/ML IJ SOLN
8.0000 mg | Freq: Three times a day (TID) | INTRAMUSCULAR | Status: AC
  Administered 2022-01-10 (×3): 8 mg via INTRAVENOUS
  Filled 2022-01-09 (×3): qty 0.8

## 2022-01-09 MED ORDER — POTASSIUM CHLORIDE 10 MEQ/100ML IV SOLN: 10.0000 meq | INTRAVENOUS | Status: AC

## 2022-01-09 MED ORDER — ARTIFICIAL TEARS OPHTHALMIC OINT
TOPICAL_OINTMENT | OPHTHALMIC | Status: AC
  Filled 2022-01-09: qty 3.5

## 2022-01-09 MED ORDER — ONDANSETRON HCL 4 MG/2ML IJ SOLN
INTRAMUSCULAR | Status: DC | PRN
  Administered 2022-01-09: 4 mg via INTRAVENOUS

## 2022-01-09 MED ORDER — FENTANYL CITRATE (PF) 250 MCG/5ML IJ SOLN
INTRAMUSCULAR | Status: AC
  Filled 2022-01-09: qty 5

## 2022-01-09 MED ORDER — POTASSIUM CHLORIDE 20 MEQ PO PACK
40.0000 meq | PACK | ORAL | Status: AC
  Administered 2022-01-09 – 2022-01-10 (×2): 40 meq via ORAL
  Filled 2022-01-09 (×2): qty 2

## 2022-01-09 MED ORDER — ONDANSETRON HCL 4 MG/2ML IJ SOLN: 4.0000 mg | Freq: Four times a day (QID) | INTRAMUSCULAR | Status: DC | PRN

## 2022-01-09 MED ORDER — MORPHINE SULFATE (PF) 4 MG/ML IV SOLN
4.0000 mg | INTRAVENOUS | Status: DC | PRN
  Administered 2022-01-13 – 2022-01-14 (×2): 4 mg via INTRAVENOUS
  Filled 2022-01-09 (×2): qty 1

## 2022-01-09 MED ORDER — SODIUM CHLORIDE 0.9 % IV SOLN
2.0000 g | Freq: Once | INTRAVENOUS | Status: AC
Start: 2022-01-09 — End: 2022-01-09
  Administered 2022-01-09: 2 g via INTRAVENOUS
  Filled 2022-01-09: qty 20

## 2022-01-09 MED ORDER — METOPROLOL TARTRATE 5 MG/5ML IV SOLN
2.5000 mg | Freq: Four times a day (QID) | INTRAVENOUS | Status: DC
  Administered 2022-01-09 – 2022-01-12 (×12): 2.5 mg via INTRAVENOUS
  Filled 2022-01-09 (×12): qty 5

## 2022-01-09 MED ORDER — METOPROLOL TARTRATE 5 MG/5ML IV SOLN
INTRAVENOUS | Status: DC | PRN
  Administered 2022-01-09 (×2): 2.5 mg via INTRAVENOUS

## 2022-01-09 MED ORDER — ONDANSETRON HCL 4 MG/2ML IJ SOLN
4.0000 mg | Freq: Once | INTRAMUSCULAR | Status: AC
  Administered 2022-01-09: 4 mg via INTRAVENOUS
  Filled 2022-01-09: qty 2

## 2022-01-09 MED ORDER — FENTANYL CITRATE (PF) 100 MCG/2ML IJ SOLN: 25.0000 ug | INTRAMUSCULAR | Status: DC | PRN

## 2022-01-09 MED ORDER — METOPROLOL TARTRATE 5 MG/5ML IV SOLN
INTRAVENOUS | Status: AC
  Filled 2022-01-09: qty 5

## 2022-01-09 MED ORDER — ACETAMINOPHEN 650 MG RE SUPP: 650.0000 mg | Freq: Four times a day (QID) | RECTAL | Status: DC | PRN

## 2022-01-09 MED ORDER — OXYCODONE HCL 5 MG PO TABS: 5.0000 mg | ORAL_TABLET | Freq: Once | ORAL | Status: DC | PRN

## 2022-01-09 MED ORDER — LIDOCAINE 2% (20 MG/ML) 5 ML SYRINGE
INTRAMUSCULAR | Status: AC
  Filled 2022-01-09: qty 5

## 2022-01-09 MED ORDER — DEXAMETHASONE SODIUM PHOSPHATE 10 MG/ML IJ SOLN
INTRAMUSCULAR | Status: DC | PRN
  Administered 2022-01-09: 10 mg via INTRAVENOUS

## 2022-01-09 MED ORDER — SODIUM CHLORIDE 0.45 % IV SOLN: INTRAVENOUS | Status: DC

## 2022-01-09 MED ORDER — PROPOFOL 10 MG/ML IV BOLUS
INTRAVENOUS | Status: DC | PRN
  Administered 2022-01-09: 100 mg via INTRAVENOUS

## 2022-01-09 MED ORDER — SODIUM CHLORIDE 0.9 % IV BOLUS
2000.0000 mL | Freq: Once | INTRAVENOUS | Status: AC
  Administered 2022-01-09: 2000 mL via INTRAVENOUS

## 2022-01-09 MED ORDER — SODIUM CHLORIDE (PF) 0.9 % IJ SOLN
INTRAMUSCULAR | Status: AC
  Filled 2022-01-09: qty 50

## 2022-01-09 MED ORDER — FENTANYL CITRATE (PF) 250 MCG/5ML IJ SOLN
INTRAMUSCULAR | Status: DC | PRN
  Administered 2022-01-09 (×2): 50 ug via INTRAVENOUS

## 2022-01-09 MED ORDER — SODIUM CHLORIDE 0.9 % IR SOLN
Status: DC | PRN
  Administered 2022-01-09: 1000 mL

## 2022-01-09 MED ORDER — ONDANSETRON HCL 4 MG PO TABS: 4.0000 mg | ORAL_TABLET | Freq: Four times a day (QID) | ORAL | Status: DC | PRN

## 2022-01-09 MED ORDER — ONDANSETRON HCL 4 MG/2ML IJ SOLN
INTRAMUSCULAR | Status: AC
  Filled 2022-01-09: qty 2

## 2022-01-09 MED ORDER — PHENYLEPHRINE 40 MCG/ML (10ML) SYRINGE FOR IV PUSH (FOR BLOOD PRESSURE SUPPORT)
PREFILLED_SYRINGE | INTRAVENOUS | Status: AC
  Filled 2022-01-09: qty 10

## 2022-01-09 MED ORDER — SUCCINYLCHOLINE CHLORIDE 200 MG/10ML IV SOSY
PREFILLED_SYRINGE | INTRAVENOUS | Status: DC | PRN
  Administered 2022-01-09: 100 mg via INTRAVENOUS

## 2022-01-09 MED ORDER — SUCCINYLCHOLINE CHLORIDE 200 MG/10ML IV SOSY
PREFILLED_SYRINGE | INTRAVENOUS | Status: AC
  Filled 2022-01-09: qty 10

## 2022-01-09 MED ORDER — SODIUM CHLORIDE 0.9 % IV SOLN
3.0000 g | Freq: Four times a day (QID) | INTRAVENOUS | Status: DC
  Administered 2022-01-09 – 2022-01-12 (×12): 3 g via INTRAVENOUS
  Filled 2022-01-09 (×13): qty 8

## 2022-01-09 MED ORDER — ONDANSETRON HCL 4 MG/2ML IJ SOLN: 4.0000 mg | Freq: Once | INTRAMUSCULAR | Status: DC | PRN

## 2022-01-09 MED ORDER — HYDRALAZINE HCL 20 MG/ML IJ SOLN
10.0000 mg | Freq: Four times a day (QID) | INTRAMUSCULAR | Status: DC | PRN
Start: 2022-01-09 — End: 2022-01-15

## 2022-01-09 MED ORDER — VANCOMYCIN HCL IN DEXTROSE 1-5 GM/200ML-% IV SOLN
1000.0000 mg | Freq: Once | INTRAVENOUS | Status: AC
  Administered 2022-01-09: 1000 mg via INTRAVENOUS
  Filled 2022-01-09: qty 200

## 2022-01-09 MED ORDER — LIDOCAINE-EPINEPHRINE 1 %-1:100000 IJ SOLN
INTRAMUSCULAR | Status: DC | PRN
  Administered 2022-01-09: 8 mL

## 2022-01-09 MED ORDER — DEXAMETHASONE SODIUM PHOSPHATE 10 MG/ML IJ SOLN
INTRAMUSCULAR | Status: AC
  Filled 2022-01-09: qty 1

## 2022-01-09 MED ORDER — OXYCODONE HCL 5 MG/5ML PO SOLN: 5.0000 mg | Freq: Once | ORAL | Status: DC | PRN

## 2022-01-09 MED ORDER — IOHEXOL 300 MG/ML  SOLN
60.0000 mL | Freq: Once | INTRAMUSCULAR | Status: AC | PRN
  Administered 2022-01-09: 60 mL via INTRAVENOUS

## 2022-01-09 MED ORDER — LIDOCAINE 2% (20 MG/ML) 5 ML SYRINGE
INTRAMUSCULAR | Status: DC | PRN
  Administered 2022-01-09: 60 mg via INTRAVENOUS

## 2022-01-09 MED ORDER — LACTATED RINGERS IV SOLN: INTRAVENOUS | Status: DC | PRN

## 2022-01-09 MED ORDER — ACETAMINOPHEN 325 MG PO TABS
650.0000 mg | ORAL_TABLET | Freq: Four times a day (QID) | ORAL | Status: DC | PRN
  Administered 2022-01-12 – 2022-01-15 (×9): 650 mg via ORAL
  Filled 2022-01-09 (×10): qty 2

## 2022-01-09 SURGICAL SUPPLY — 43 items
BAG COUNTER SPONGE SURGICOUNT (BAG) ×2 IMPLANT
BAG SURGICOUNT SPONGE COUNTING (BAG) ×1
BAND RUBBER #18 3X1/16 STRL (MISCELLANEOUS) IMPLANT
BLADE SURG 15 STRL LF DISP TIS (BLADE) IMPLANT
BLADE SURG 15 STRL SS (BLADE)
BNDG CONFORM 2 STRL LF (GAUZE/BANDAGES/DRESSINGS) ×3 IMPLANT
CATH ROBINSON RED A/P 12FR (CATHETERS) IMPLANT
CNTNR URN SCR LID CUP LEK RST (MISCELLANEOUS) IMPLANT
CONT SPEC 4OZ STRL OR WHT (MISCELLANEOUS) ×3
COVER SURGICAL LIGHT HANDLE (MISCELLANEOUS) ×6 IMPLANT
DRAIN PENROSE 1/4X12 LTX STRL (WOUND CARE) ×3 IMPLANT
DRAIN PENROSE 18X1/4 LTX STRL (DRAIN) ×2 IMPLANT
DRAPE HALF SHEET 40X57 (DRAPES) IMPLANT
DRAPE ORTHO SPLIT 77X108 STRL (DRAPES) ×3
DRAPE SURG ORHT 6 SPLT 77X108 (DRAPES) ×1 IMPLANT
DRSG PAD ABDOMINAL 8X10 ST (GAUZE/BANDAGES/DRESSINGS) IMPLANT
ELECT COATED BLADE 2.86 ST (ELECTRODE) ×3 IMPLANT
ELECT REM PT RETURN 9FT ADLT (ELECTROSURGICAL) ×3
ELECTRODE REM PT RTRN 9FT ADLT (ELECTROSURGICAL) ×1 IMPLANT
GAUZE 4X4 16PLY ~~LOC~~+RFID DBL (SPONGE) ×3 IMPLANT
GAUZE SPONGE 4X4 12PLY STRL (GAUZE/BANDAGES/DRESSINGS) IMPLANT
GLOVE SURG ENC MOIS LTX SZ6.5 (GLOVE) ×3 IMPLANT
GOWN STRL REUS W/ TWL LRG LVL3 (GOWN DISPOSABLE) ×1 IMPLANT
GOWN STRL REUS W/TWL LRG LVL3 (GOWN DISPOSABLE) ×3
KIT BASIN OR (CUSTOM PROCEDURE TRAY) ×3 IMPLANT
KIT TURNOVER KIT B (KITS) ×3 IMPLANT
MARKER SKIN DUAL TIP RULER LAB (MISCELLANEOUS) ×3 IMPLANT
NDL HYPO 25GX1X1/2 BEV (NEEDLE) ×1 IMPLANT
NEEDLE HYPO 25GX1X1/2 BEV (NEEDLE) ×3 IMPLANT
NS IRRIG 1000ML POUR BTL (IV SOLUTION) ×3 IMPLANT
PACK SURGICAL SETUP 50X90 (CUSTOM PROCEDURE TRAY) ×3 IMPLANT
PAD ARMBOARD 7.5X6 YLW CONV (MISCELLANEOUS) ×6 IMPLANT
PENCIL SMOKE EVACUATOR (MISCELLANEOUS) ×3 IMPLANT
POSITIONER HEAD DONUT 9IN (MISCELLANEOUS) ×2 IMPLANT
SUT ETHILON 2 0 FS 18 (SUTURE) ×3 IMPLANT
SUT SILK 2 0 SH CR/8 (SUTURE) IMPLANT
SWAB COLLECTION DEVICE MRSA (MISCELLANEOUS) ×3 IMPLANT
SWAB CULTURE ESWAB REG 1ML (MISCELLANEOUS) ×3 IMPLANT
SYR BULB IRRIG 60ML STRL (SYRINGE) ×3 IMPLANT
SYR CONTROL 10ML LL (SYRINGE) ×2 IMPLANT
TUBE CONNECTING 12'X1/4 (SUCTIONS) ×1
TUBE CONNECTING 12X1/4 (SUCTIONS) ×2 IMPLANT
YANKAUER SUCT BULB TIP NO VENT (SUCTIONS) ×3 IMPLANT

## 2022-01-09 NOTE — Assessment & Plan Note (Addendum)
Patient with leukocytosis, tachycardia and tachypnea. Urinalysis unremarkable. Blood and urine cultures obtained on admission. Patient given 2 L of normal saline in the ED. Vancomycin and Ceftriaxone given empirically in the ED. changed to ampicillin sulbactam and then IV ertapenem per ID recommendations given the cultures that grew with Enterobacter and actinomyces ?-Antibiotics, see problem, Abscess of jaw, left ?-Follow-up urine/blood culture data ?-Continue IV fluids while hospitalized and stopped ?-ENT did I&D and ID was consulted and recommending outpatient follow-up ?

## 2022-01-09 NOTE — ED Notes (Signed)
Called and gave report to [facility] ?

## 2022-01-09 NOTE — Assessment & Plan Note (Signed)
Patient with a history of sinus tachycardia in addition to SVT. Seen by cardiology as an outpatient. On metoprolol and diltiazem. Complicated by sepsis. ?-Metoprolol 2.5 mg IV q6 hours while NPO ?

## 2022-01-09 NOTE — ED Triage Notes (Signed)
Pt BIB EMS from home, c/o generalized weakness and infection in left jaw. Reported redness and swelling radiating down to neck. Poor appetite, no fever or chills. Fell this morning, denies injury. 18 G placed RAC, 150 saline bolus. ? ?BP160/80 ?P 116 ?RR 30 ?Spo2 97% ?CBG 271 ?

## 2022-01-09 NOTE — Progress Notes (Signed)
Karina [name] is a 88 y/o F with left perimandibular abscess, now status post incision and drainage. ? ?Plan: ?-Patient admitted to hospital medicine for observation ?-Continue IV antibiotics as per primary ?-Follow-up intraoperative cultures/biopsy ?-Okay to begin clear liquid diet ?-Recommend Peridex swish and spit every 4 hours ?-Penrose drain secured intraorally.  Will plan on keeping this in place for approximately 36-48 hours, pending clinical improvement ?-Recommend application of warm compresses to the left neck and mandible, with gentle massage ? ?Thank you for allowing me to participate in the care of this patient. Please do not hesitate to contact me with any questions or concerns.  ? ?Scalp Level, DO ?Otolaryngology ?[facility] ?Cell: [phone] ? ? ?

## 2022-01-09 NOTE — Transfer of Care (Signed)
Immediate Anesthesia Transfer of Care Note ? ?Patient: Karina [name] ? ?Procedure(s) Performed: INCISION AND DRAINAGE NECK  ABSCESS (Left) ? ?Patient Location: PACU ? ?Anesthesia Type:General ? ?Level of Consciousness: awake, alert  and oriented ? ?Airway & Oxygen Therapy: Patient Spontanous Breathing and Patient connected to face mask oxygen ? ?Post-op Assessment: Report given to RN and Post -op Vital signs reviewed and stable ? ?Post vital signs: Reviewed and stable ? ?Last Vitals:  ?Vitals Value Taken Time  ?BP 137/66 01/09/22 2138  ?Temp 36.9 ?C 01/09/22 2138  ?Pulse 106 01/09/22 2143  ?Resp 21 01/09/22 2143  ?SpO2 100 % 01/09/22 2143  ?Vitals shown include unvalidated device data. ? ?Last Pain:  ?Vitals:  ? 01/09/22 1801  ?TempSrc: Oral  ?PainSc:   ?   ? ?  ? ?Complications: No notable events documented. ?

## 2022-01-09 NOTE — Progress Notes (Signed)
Pharmacy Antibiotic Note ? ?[name] is a 88 y.o. female admitted on 01/09/2022 with mandibular/submandibular abscess, sepsis.  Pharmacy has been consulted for Unasyn dosing. ? ?Scr downtrending 1.3 > 1.1, baseline <1.  CrCl borderline for Unasyn adjustment to q12hour dosing. Anticipate renal function to improve further.  ? ?Plan: ?Unasyn 3g IV q6 hours ?Monitor renal function for dose adjustments ? ?Height: 5\' 3"  (160 cm) ?Weight: 54.9 kg (121 lb) ?IBW/kg (Calculated) : 52.4 ? ?Temp (24hrs), Avg:98.6 ?F (37 ?C), Min:98.1 ?F (36.7 ?C), Max:99.4 ?F (37.4 ?C) ? ?Recent Labs  ?Lab 01/09/22 ?1333 01/09/22 ?1441 01/09/22 ?1600  ?WBC 17.6*  --   --   ?CREATININE 1.30* 1.10*  --   ?LATICACIDVEN 2.8*  --  1.7  ?  ?Estimated Creatinine Clearance: 30.4 mL/min (A) (by C-G formula based on SCr of 1.1 mg/dL (H)).   ? ?No Known Allergies ? ?Antimicrobials this admission: ?Vanc/CTX x1  ?Unasyn 3/1 >>  ? ?Dose adjustments this admission: ? ?Microbiology results: ?3/1 BCx:  ?3/1 UCx:   ? ?Thank you for allowing pharmacy to be a part of this patient?s care. ? ?Dimple Nanas, PharmD ?01/09/2022 6:37 PM ? ?

## 2022-01-09 NOTE — Op Note (Signed)
OPERATIVE NOTE ? ?Adine Madura Date/Time of Admission: 01/09/2022  1:09 PM  ?CSN: [id number] Attending Provider: Mariel Aloe, MD ?Room/Bed: MCPO/NONE DOB: 01/27/1933 Age: 88 y.o. ? ? ?Pre-Op Diagnosis: ?NECK ABSCESS ? ?Post-Op Diagnosis: ?NECK ABSCESS ? ?Procedure: ?Procedure(s): ?INCISION AND DRAINAGE NECK  ABSCESS ? ?Anesthesia: ?General ? ?Surgeon(s): ?[place], DO ? ?Staff: ?Circulator: Catha Nottingham, RN ?Scrub Person: Panchit, Noukone M ? ?Implants: ?* No implants in log * ? ?Specimens: ?ID Type Source Tests Collected by Time Destination  ?1 : LEFT MANDIBLE pERI MANDIBULAR TISSUE Tissue PATH Bone biopsy SURGICAL PATHOLOGY Kalli Greenfield, [place], DO 01/09/2022 2115   ?A : LEFT PERIMANDIBULAR ABCESS Tissue PATH Bone biopsy AEROBIC/ANAEROBIC CULTURE W GRAM STAIN (SURGICAL/DEEP WOUND) Sherre Wooton, [place], DO 01/09/2022 2111   ? ? ?Complications: ?None ? ?EBL: ?<5 ML ? ?Condition: ?stable ? ?Operative Findings:  ?Left perimandibular abscess, mandible appeared healthy with normal bony cortex ? ?Description of Operation: ?After discussing risks, benefits, and alternatives, the patient was brought to the operative suite and placed on the operative table in the supine position.  Anesthesia was induced and the patient was intubated by the anesthesia team without difficulty.  The perimandibular abscess was palpated and the oral cavity was exposed with a mouthgag. After bimanual palpation, the abscess began spontaneously draining intraorally. 1% Lidocaine with 1:100,000 epinephrine was injected along the left mandible.  A 2 cm incision was then made with a 15 blade and immediate copious purulence was encountered.  Blunt dissection with a hemostat was then performed into the fluid pocket with additional purulence encountered.  Cultures were obtained.  The pocket was then copiously irrigated with saline. A 1/4 inch Penrose drain was placed in the depth of the wound and secured to the buccal mucosa  using 3-0 nylon suture, which was also used to loosely approximate the proximal edge of the incision.  The patient was then returned to the care of anesthesia for wake-up and was extubated and moved to the recovery room in stable condition. ? ? ? ?Garden, DO ?[facility]  ?01/09/2022   ? ?

## 2022-01-09 NOTE — Assessment & Plan Note (Addendum)
-  Patient is edentulous. 4 cm abscess noted in right mandibular/submandibular space. ENT, Dr. Fredric Dine, consulted by ED with recommendation for transfer to [facility] with plan for probable I&D. Patient started given empiric Vancomycin and Ceftriaxone in the ED. ?-Unasyn IV was then subsequently initiated and then she went for I&D. ?-ENT recommended ID consult and she was changed to ertapenem based on culture results ?

## 2022-01-09 NOTE — Assessment & Plan Note (Addendum)
Creatinine of 1.3 on admission. Baseline creatinine is about 1, however last labs from years prior. Patient given IV fluids for sepsis. ?-Continue IV fluids and last check BUN/creatinine was 20/1.11 and stable ?-Repeat CMP within 1 week ?

## 2022-01-09 NOTE — Anesthesia Procedure Notes (Signed)
Procedure Name: Intubation ?Date/Time: 01/09/2022 9:02 PM ?Performed by: Alain Marion, CRNA ?Pre-anesthesia Checklist: Patient identified, Emergency Drugs available, Suction available and Patient being monitored ?Patient Re-evaluated:Patient Re-evaluated prior to induction ?Oxygen Delivery Method: Circle System Utilized ?Preoxygenation: Pre-oxygenation with 100% oxygen ?Induction Type: IV induction and Rapid sequence ?Laryngoscope Size: Glidescope and 3 ?Grade View: Grade I ?Tube type: Oral ?Tube size: 7.0 mm ?Number of attempts: 1 ?Airway Equipment and Method: Stylet and Oral airway ?Placement Confirmation: ETT inserted through vocal cords under direct vision, positive ETCO2 and breath sounds checked- equal and bilateral ?Secured at: 21 cm ?Tube secured with: Tape ?Dental Injury: Teeth and Oropharynx as per pre-operative assessment  ? ? ? ? ?

## 2022-01-09 NOTE — Assessment & Plan Note (Addendum)
Patient is on Xanax prn as an outpatient. Held on admission but has been resumed ?

## 2022-01-09 NOTE — ED Notes (Signed)
Patient given two warm blankets   

## 2022-01-09 NOTE — Assessment & Plan Note (Addendum)
-  Continue Zofran IV prn ?-Improved and she is stable ?

## 2022-01-09 NOTE — ED Provider Notes (Signed)
[facility] DEPT Provider Note   CSN: [id number] Arrival date & time: 01/09/22  1304     History  Chief Complaint  Patient presents with   Weakness   Wound Infection    Karina [name] is a 88 y.o. female.  Patient with a history of hypertension.  Complains of swelling to left side of her face and tenderness  The history is provided by the patient and medical records. No language interpreter was used.  Weakness Severity:  Moderate Onset quality:  Sudden Timing:  Constant Progression:  Worsening Chronicity:  New Context: not alcohol use   Relieved by:  Nothing Worsened by:  Nothing Ineffective treatments:  None tried Associated symptoms: no abdominal pain, no chest pain, no cough, no diarrhea, no frequency, no headaches and no seizures       Home Medications Prior to Admission medications   Medication Sig Start Date End Date Taking? Authorizing Provider  ALPRAZolam Duanne Moron) 0.5 MG tablet Take 1 tablet (0.5 mg total) by mouth as needed for sleep or anxiety. 1/2 to 2 tabs po bid prn for anxiety, insomnia 10/27/15   Mosie Lukes, MD  aspirin 81 MG tablet Take 81 mg by mouth daily.    [provider]  Cholecalciferol (VITAMIN D-3 PO) Take 5,000 Units by mouth daily.    [provider]  clobetasol cream (TEMOVATE) 0.05 %  08/23/20   [provider]  Coenzyme Q10 (CO Q-10) 50 MG CAPS Take by mouth daily.    [provider]  conjugated estrogens (PREMARIN) vaginal cream Place 1 Applicatorful vaginally. 1/4 gram per vagina once weekly.    [provider]  diltiazem (CARDIZEM CD) 240 MG 24 hr capsule TAKE 1 CAPSULE (240 MG TOTAL) BY MOUTH DAILY. 06/07/20   Tommie Raymond, NP  diphenhydramine-acetaminophen (TYLENOL PM) 25-500 MG TABS Take 1 tablet by mouth at bedtime as needed (pain/sleep.).     [provider]  levothyroxine (SYNTHROID) 75 MCG tablet TAKE 1 TABLET EVERY DAY ON AN EMPTY STOMACH     [provider]  meloxicam (MOBIC) 15 MG tablet Take 15 mg by mouth daily.    [provider]  metoprolol succinate (TOPROL-XL) 50 MG 24 hr tablet Take 1 tablet by mouth 2 (two) times daily.    [provider]  Multiple Vitamins-Minerals (ALIVE WOMENS 50+) TABS one tablet PO daily 12/23/16   [provider]  pantoprazole (PROTONIX) 40 MG tablet Take 40 mg by mouth daily.    [provider]  simvastatin (ZOCOR) 20 MG tablet Take 1 tablet by mouth daily.    [provider]      Allergies    Patient has no known allergies.    Review of Systems   Review of Systems  Constitutional:  Negative for appetite change and fatigue.  HENT:  Negative for congestion, ear discharge and sinus pressure.        Swelling and tenderness to the left side of the face.  The redness goes down around her neck  Eyes:  Negative for discharge.  Respiratory:  Negative for cough.   Cardiovascular:  Negative for chest pain.  Gastrointestinal:  Negative for abdominal pain and diarrhea.  Genitourinary:  Negative for frequency and hematuria.  Musculoskeletal:  Negative for back pain.  Skin:  Negative for rash.  Neurological:  Positive for weakness. Negative for seizures and headaches.  Psychiatric/Behavioral:  Negative for hallucinations.    Physical Exam Updated Vital Signs BP (!) 156/86 (  BP Location: Left Arm)    Pulse (!) 114    Temp 98.3 F (36.8 C) (Oral)    Resp (!) 23    Ht 5\' 3"  (1.6 m)    Wt 54.9 kg    SpO2 97%    BMI 21.43 kg/m  Physical Exam Vitals and nursing note reviewed.  Constitutional:      Appearance: She is well-developed.  HENT:     Head: Normocephalic.     Comments: Swelling tenderness to left side of face consistent with cellulitis and possible abscess.    Nose: Nose normal.  Eyes:     General: No scleral icterus.    Conjunctiva/sclera: Conjunctivae normal.  Neck:     Thyroid: No thyromegaly.  Cardiovascular:     Rate and Rhythm:  Normal rate and regular rhythm.     Heart sounds: No murmur heard.   No friction rub. No gallop.  Pulmonary:     Breath sounds: No stridor. No wheezing or rales.  Chest:     Chest wall: No tenderness.  Abdominal:     General: There is no distension.     Tenderness: There is no abdominal tenderness. There is no rebound.  Musculoskeletal:        General: Normal range of motion.     Cervical back: Neck supple.  Lymphadenopathy:     Cervical: No cervical adenopathy.  Skin:    Findings: No erythema or rash.  Neurological:     Mental Status: She is alert and oriented to person, place, and time.     Motor: No abnormal muscle tone.     Coordination: Coordination normal.  Psychiatric:        Behavior: Behavior normal.    ED Results / Procedures / Treatments   Labs (all labs ordered are listed, but only abnormal results are displayed) Labs Reviewed  LACTIC ACID, PLASMA - Abnormal; Notable for the following components:      Result Value   Lactic Acid, Venous 2.8 (*)    All other components within normal limits  COMPREHENSIVE METABOLIC PANEL - Abnormal; Notable for the following components:   Potassium 3.0 (*)    Glucose, Bld 190 (*)    BUN 34 (*)    Creatinine, Ser 1.30 (*)    Total Protein 8.4 (*)    GFR, Estimated 40 (*)    All other components within normal limits  CBC WITH DIFFERENTIAL/PLATELET - Abnormal; Notable for the following components:   WBC 17.6 (*)    Hemoglobin 11.1 (*)    HCT 34.8 (*)    Neutro Abs 13.5 (*)    Monocytes Absolute 1.4 (*)    Abs Immature Granulocytes 0.23 (*)    All other components within normal limits  CBG MONITORING, ED - Abnormal; Notable for the following components:   Glucose-Capillary 200 (*)    All other components within normal limits  I-STAT CHEM 8, ED - Abnormal; Notable for the following components:   Potassium 3.2 (*)    BUN 30 (*)    Creatinine, Ser 1.10 (*)    Glucose, Bld 135 (*)    Calcium, Ion 1.14 (*)    Hemoglobin 10.9  (*)    HCT 32.0 (*)    All other components within normal limits  RESP PANEL BY RT-PCR (FLU A&B, COVID) ARPGX2  CULTURE, BLOOD (ROUTINE X 2)  CULTURE, BLOOD (ROUTINE X 2)  URINE CULTURE  PROTIME-INR  APTT  LACTIC ACID, PLASMA  URINALYSIS, ROUTINE W REFLEX MICROSCOPIC  EKG EKG Interpretation  Date/Time:  Wednesday January 09 2022 13:28:38 EST Ventricular Rate:  105 PR Interval:  209 QRS Duration: 87 QT Interval:  339 QTC Calculation: 448 R Axis:   52 Text Interpretation: Sinus tachycardia Borderline prolonged PR interval Borderline T wave abnormalities Confirmed by Thamas Jaegers ([id number]) on 01/09/2022 3:32:50 PM  Radiology No results found.  Procedures Procedures    Medications Ordered in ED Medications  cefTRIAXone (ROCEPHIN) 2 g in sodium chloride 0.9 % 100 mL IVPB (2 g Intravenous New Bag/Given 01/09/22 1414)  sodium chloride 0.9 % bolus 2,000 mL (2,000 mLs Intravenous New Bag/Given 01/09/22 1415)  iohexol (OMNIPAQUE) 300 MG/ML solution 60 mL (60 mLs Intravenous Contrast Given 01/09/22 1514)  sodium chloride (PF) 0.9 % injection (  Given 01/09/22 1516)    ED Course/ Medical Decision Making/ A&P .ED critical CRITICAL CARE Performed by: Milton Ferguson Total critical care time: 40 minutes Critical care time was exclusive of separately billable procedures and treating other patients. Critical care was necessary to treat or prevent imminent or life-threatening deterioration. Critical care was time spent personally by me on the following activities: development of treatment plan with patient and/or surrogate as well as nursing, discussions with consultants, evaluation of patient's response to treatment, examination of patient, obtaining history from patient or surrogate, ordering and performing treatments and interventions, ordering and review of laboratory studies, ordering and review of radiographic studies, pulse oximetry and re-evaluation of patient's condition.                           Medical Decision Making Amount and/or Complexity of Data Reviewed Labs: ordered. Radiology: ordered. ECG/medicine tests: ordered.  Risk Prescription drug management. Decision regarding hospitalization.    cellulitis and sepsis.  CT scan pending.  Disposition will be determined by my colleague Dr. Almyra Free    This patient presents to the ED for concern of facial swelling, this involves an extensive number of treatment options, and is a complaint that carries with it a high risk of complications and morbidity.  The differential diagnosis includes tumor to face, abscess   Co morbidities that complicate the patient evaluation  Hypertension   Additional history obtained:  Additional history obtained from patient External records from outside source obtained and reviewed including hospital record   Lab Tests:  I Ordered, and personally interpreted labs.  The pertinent results include: CBC and chemistries which showed elevated white count 17,000 with dehydration   Imaging Studies ordered:  I ordered imaging studies including CT scan shows abscess to face with cellulitis I independently visualized and interpreted imaging which showed 4 cm collection of abscess at the angle of the mandible I agree with the radiologist interpretation   Cardiac Monitoring:  The patient was maintained on a cardiac monitor.  I personally viewed and interpreted the cardiac monitored which showed an underlying rhythm of: Sinus tach   Medicines ordered and prescription drug management:  I ordered medication including Rocephin for cellulitis Reevaluation of the patient after these medicines showed that the patient stayed the same I have reviewed the patients home medicines and have made adjustments as needed   Test Considered:  None   Critical Interventions:  Consulting ENT   Consultations Obtained:  I requested consultation with the ENT and hospitalist,  and discussed lab and  imaging findings as well as pertinent plan - they recommend: Hospitalist will admit, ENT will drain the abscess   Problem List / ED Course:  Abscess to face with cellulitis    Social Determinants of Health:  None  Patient was admitted to the hospitalist with ENT consult.  Dr. Almyra Free arranged for admission and consult        Final Clinical Impression(s) / ED Diagnoses Final diagnoses:  None    Rx / DC Orders ED Discharge Orders     None         Milton Ferguson, MD 01/10/22 1000

## 2022-01-09 NOTE — Sepsis Progress Note (Signed)
eLink is following this Code Sepsis. °

## 2022-01-09 NOTE — H&P (Signed)
History and Physical    Patient: Karina [name] UVO:[id number] DOB: August 21, 1933 DOA: 01/09/2022 DOS: the patient was seen and examined on 01/09/2022 PCP: Burnard Bunting, MD  Patient coming from: Home  Chief Complaint:  Chief Complaint  Patient presents with   Weakness   Wound Infection    HPI: Karina [name] is a 88 y.o. female with a history of anemia, hypothyroidism, SVT/sinus tachycardia, hypertension, anxiety. Patient presented secondary to falling this morning. She reports failing this morning after feeling lightheaded. No associated dyspnea, chest pain or palpitations. Patient unable to provide much more history and further history provided by niece. This morning, patient's sister called and paitent told her sister than she fell on the floor. Patient sister called the patient's son and called 56. Patient does report some left jaw/neck swelling that started three days prior to admission. Patient does report taking Tylenol for some pain. She reports some nausea without emesis.   Review of Systems: As mentioned in the history of present illness. All other systems reviewed and are negative. Past Medical History:  Diagnosis Date   ANEMIA, CHRONIC 08/13/2006   Arthritis    left knee   Atrophic vaginitis 10/03/2013   CONSTIPATION 01/28/2011   Depression with anxiety 2012-03-11   Husband, [name] died in 07-29-11 after 58 years of marriage  Lost a 34 year old daughter to cancer    DIVERTICULOSIS, COLON 01/28/2011   Endometriosis    GERD (gastroesophageal reflux disease)    HTN (hypertension) 03/06/2011   Hypercholesteremia 2012/03/11   Lesion of external ear 03/06/2011   OAB (overactive bladder) 12/17/2015   Osteoporosis    Post menopausal problems 03/06/2011   Sinus tachycardia    mild-- retesting   SVT (supraventricular tachycardia) ([place]) 2007   3-4 beats.. Holter   Past Surgical History:  Procedure Laterality Date   ABDOMINAL HYSTERECTOMY     w/ unilateral  oophorectomy-bleeding, ovarian cysts, left ovary left in place?   BREAST SURGERY     Implants--Silicone   right knee arthroscopy, cleaned     Social History:  reports that she has never smoked. She has never used smokeless tobacco. She reports that she does not drink alcohol and does not use drugs.  No Known Allergies  Family History  Problem Relation Age of Onset   Stroke Mother    Hypertension Mother    Other Mother        CHF   Transient ischemic attack Mother    Cancer Mother 23       cervical   Cancer Father        stomach, smoker   Cancer Sister        brain   Lymphoma Brother    Cancer Brother    Stroke Daughter        ? stroke   Mitral valve prolapse Daughter    Irritable bowel syndrome Daughter    Migraines Daughter    Hypertension Son    Other Maternal Grandmother        CHF   Alcohol abuse Brother    Heart attack Brother        smoker   Cancer Paternal Aunt        cancer   Cancer Paternal Uncle        X 2 CA   Heart attack Paternal Uncle     Prior to Admission medications   Medication Sig Start Date End Date Taking? Authorizing Provider  ALPRAZolam Duanne Moron) 0.5 MG tablet Take  1 tablet (0.5 mg total) by mouth as needed for sleep or anxiety. 1/2 to 2 tabs po bid prn for anxiety, insomnia Patient taking differently: Take 0.25 mg by mouth 2 (two) times daily as needed for sleep or anxiety. 10/27/15  Yes Mosie Lukes, MD  aspirin 81 MG tablet Take 81 mg by mouth daily.   Yes [provider]  Cholecalciferol (VITAMIN D-3 PO) Take 5,000 Units by mouth in the morning and at bedtime.   Yes [provider]  Coenzyme Q10 (CO Q-10) 50 MG CAPS Take 1 capsule by mouth daily.   Yes [provider]  diltiazem (CARDIZEM CD) 240 MG 24 hr capsule TAKE 1 CAPSULE (240 MG TOTAL) BY MOUTH DAILY. 06/07/20  Yes Kathyrn Drown D, NP  diphenhydramine-acetaminophen (TYLENOL PM) 25-500 MG TABS Take 1 tablet by mouth at bedtime as needed (pain/sleep.).     Yes [provider]  levothyroxine (SYNTHROID) 75 MCG tablet TAKE 1 TABLET EVERY DAY ON AN EMPTY STOMACH   Yes [provider]  meloxicam (MOBIC) 15 MG tablet Take 15 mg by mouth daily.   Yes [provider]  metoprolol succinate (TOPROL-XL) 50 MG 24 hr tablet Take 1 tablet by mouth 2 (two) times daily.   Yes [provider]  Multiple Vitamins-Minerals (ALIVE WOMENS 50+) TABS Take 1 tablet by mouth daily. 12/23/16  Yes [provider]  pantoprazole (PROTONIX) 40 MG tablet Take 40 mg by mouth daily.   Yes [provider]  simvastatin (ZOCOR) 20 MG tablet Take 1 tablet by mouth daily.   Yes [provider]    Physical Exam: Vitals:   01/09/22 1533 01/09/22 1536 01/09/22 1645 01/09/22 1801  BP:  (!) 156/86 (!) 168/83 (!) 157/104  Pulse:  (!) 114 (!) 120 (!) 135  Resp:  (!) 23 (!) 27 (!) 23  Temp: 98.3 F (36.8 C)   98.1 F (36.7 C)  TempSrc: Oral   Oral  SpO2:  97% 95% 94%  Weight:      Height:       General exam: Appears calm and comfortable and in no acute distress. Conversant Respiratory: Clear to auscultation. Tachypnea without significant distress Cardiovascular: S1 & S2 heard, normal rhythm with elevated rate. No murmurs, rubs, gallops or clicks. No edema Gastrointestinal: Abdomen is non-distended, soft and non-tender. No masses felt. Normal bowel sounds heard Neurologic: No focal neurological deficits Musculoskeletal: No calf tenderness Skin: No cyanosis. No new rashes Psychiatry: Alert and oriented. Memory intact. Mood & affect appropriate  Data Reviewed:  BMP significant for potassium of 3.2, glucose of 135, BUN/creatinine of 34/1.30 Lactic acid of 2.8 > 1.7 CBC significant for WBC of 17,600 and hemoglobin of 11.1   Assessment and Plan: * Severe sepsis ([place])- (present on admission) Patient with leukocytosis, tachycardia and tachypnea. Urinalysis unremarkable. Blood and urine cultures obtained on admission.  Patient given 2 L of normal saline in the ED. Vancomycin and Ceftriaxone given empirically in the ED. -Antibiotics, see problem, Abscess of jaw, left -Follow-up urine/blood culture data -Continue IV fluids  Cellulitis of left jaw See problem, Abscess of jaw, left  Abscess of jaw, left Patient is edentulous. 4 cm abscess noted in right mandibular/submandibular space. ENT, Dr. Fredric Dine, consulted by ED with recommendation for transfer to [facility] with plan for probable I&D. Patient started given empiric Vancomycin and Ceftriaxone in the ED. -Unasyn IV -ENT recommendations  AKI (acute kidney injury) ([place]) Creatinine of 1.3 on admission. Baseline creatinine is about  1, however last labs from years prior. Patient given IV fluids for sepsis. -Continue IV fluids -Metabolic panel in AM  Hypothyroidism Patient is on Synthroid 75 mcg daily as an outpatient. Patient with a history of SVT and sinus tachycardia and is currently tachycardic. Since tachycardia likely secondary to acute illness/lack of patient taking medication this morning, will hold off on TSH testing. -Hold Synthroid while NPO; Synthroid 37.5 mcg IV if still NPO in 2-3 days  Sinus tachycardia- (present on admission) Patient with a history of sinus tachycardia in addition to SVT. Seen by cardiology as an outpatient. On metoprolol and diltiazem. Complicated by sepsis. -Metoprolol 2.5 mg IV q6 hours while NPO  SVT (supraventricular tachycardia) ([place])- (present on admission) History. No current evidence of SVT. See problem, Sinus tachycardia  Depression with anxiety- (present on admission) Patient is on Xanax prn as an outpatient. Held on admission.  HTN (hypertension)- (present on admission) Patient is on diltiazem and metoprolol as an outpatient which are held while NPO -Metoprolol 2.5 mg IV q6 hours -Hydralazine 10 mg IV PRN  Chronic anemia- (present on admission) Baseline hemoglobin appears to be around 11.  Currently stable.  Hyperlipidemia- (present on admission) Patient is on simvastatin as an outpatient. Simvastatin held on admission.     Advance Care Planning: Full code  Consults: ENT, Dr. Fredric Dine (EDP)  Family Communication: Niece at bedside   Author: Cordelia Poche, MD 01/09/2022 7:42 PM  For on call review [email or web address].

## 2022-01-09 NOTE — Assessment & Plan Note (Addendum)
Patient is on simvastatin as an outpatient. Simvastatin held on admission but will be resumed as an outpatient. ?

## 2022-01-09 NOTE — Anesthesia Postprocedure Evaluation (Signed)
Anesthesia Post Note ? ?Patient: Karina [name] ? ?Procedure(s) Performed: INCISION AND DRAINAGE NECK  ABSCESS (Left) ? ?  ? ?Patient location during evaluation: PACU ?Anesthesia Type: General ?Level of consciousness: awake and alert ?Pain management: pain level controlled ?Vital Signs Assessment: post-procedure vital signs reviewed and stable ?Respiratory status: spontaneous breathing, nonlabored ventilation, respiratory function stable and patient connected to nasal cannula oxygen ?Cardiovascular status: blood pressure returned to baseline, stable and tachycardic ?Postop Assessment: no apparent nausea or vomiting ?Anesthetic complications: no ? ? ?No notable events documented. ? ?Last Vitals:  ?Vitals:  ? 01/09/22 2215 01/09/22 2252  ?BP: 132/75 (!) 144/80  ?Pulse: (!) 108 (!) 106  ?Resp: (!) 22 (!) 24  ?Temp: 37.7 ?C 37.4 ?C  ?SpO2: 95% 95%  ?  ?Last Pain:  ?Vitals:  ? 01/09/22 2252  ?TempSrc: Oral  ?PainSc:   ? ? ?  ?  ?  ?  ?  ?  ? ?Audry Pili ? ? ? ? ?

## 2022-01-09 NOTE — Consult Note (Signed)
ENT CONSULT:  Reason for Consult: Neck abscess  Referring Physician:  Dr. Benson Norway   HPI: Karina [name] is an 87 y.o. female who presented to ED with complaints of progressive left sided neck swelling and pain. Patient had workup in ED to include CT neck with contrast, which demonstrated a 3.6 x 3.0 x 4.0 cm low density collection at the angle of the mandible on the left. Patient was started on IV antibiotics and ENT was consulted for further management.    Past Medical History:  Diagnosis Date   ANEMIA, CHRONIC 08/13/2006   Arthritis    left knee   Atrophic vaginitis 10/03/2013   CONSTIPATION 01/28/2011   Depression with anxiety 12-Mar-2012   Husband, [name] died in 07-30-11 after 58 years of marriage  Lost a 45 year old daughter to cancer    DIVERTICULOSIS, COLON 01/28/2011   Endometriosis    GERD (gastroesophageal reflux disease)    HTN (hypertension) 03/06/2011   Hypercholesteremia 12-Mar-2012   Lesion of external ear 03/06/2011   OAB (overactive bladder) 12/17/2015   Osteoporosis    Post menopausal problems 03/06/2011   Sinus tachycardia    mild-- retesting   SVT (supraventricular tachycardia) ([place]) 2007   3-4 beats.. Holter    Past Surgical History:  Procedure Laterality Date   ABDOMINAL HYSTERECTOMY     w/ unilateral oophorectomy-bleeding, ovarian cysts, left ovary left in place?   BREAST SURGERY     Implants--Silicone   right knee arthroscopy, cleaned      Family History  Problem Relation Age of Onset   Stroke Mother    Hypertension Mother    Other Mother        CHF   Transient ischemic attack Mother    Cancer Mother 65       cervical   Cancer Father        stomach, smoker   Cancer Sister        brain   Lymphoma Brother    Cancer Brother    Stroke Daughter        ? stroke   Mitral valve prolapse Daughter    Irritable bowel syndrome Daughter    Migraines Daughter    Hypertension Son    Other Maternal Grandmother        CHF   Alcohol abuse  Brother    Heart attack Brother        smoker   Cancer Paternal Aunt        cancer   Cancer Paternal Uncle        X 2 CA   Heart attack Paternal Uncle     Social History:  reports that she has never smoked. She has never used smokeless tobacco. She reports that she does not drink alcohol and does not use drugs.  Allergies: No Known Allergies  Medications: I have reviewed the patient's current medications.  Results for orders placed or performed during the hospital encounter of 01/09/22 (from the past 48 hour(s))  CBG monitoring, ED     Status: Abnormal   Collection Time: 01/09/22  1:28 PM  Result Value Ref Range   Glucose-Capillary 200 (H) 70 - 99 mg/dL    Comment: Glucose reference range applies only to samples taken after fasting for at least 8 hours.  Resp Panel by RT-PCR (Flu A&B, Covid)     Status: None   Collection Time: 01/09/22  1:33 PM   Specimen: Nasopharyngeal(NP) swabs in vial transport medium  Result Value Ref  Range   SARS Coronavirus 2 by RT PCR NEGATIVE NEGATIVE    Comment: (NOTE) SARS-CoV-2 target nucleic acids are NOT DETECTED.  The SARS-CoV-2 RNA is generally detectable in upper respiratory specimens during the acute phase of infection. The lowest concentration of SARS-CoV-2 viral copies this assay can detect is 138 copies/mL. A negative result does not preclude SARS-Cov-2 infection and should not be used as the sole basis for treatment or other patient management decisions. A negative result may occur with  improper specimen collection/handling, submission of specimen other than nasopharyngeal swab, presence of viral mutation(s) within the areas targeted by this assay, and inadequate number of viral copies(<138 copies/mL). A negative result must be combined with clinical observations, patient history, and epidemiological information. The expected result is Negative.  Fact Sheet for Patients:  EntrepreneurPulse.com.au  Fact Sheet for  Healthcare Providers:  IncredibleEmployment.be  This test is no t yet approved or cleared by the [place] FDA and  has been authorized for detection and/or diagnosis of SARS-CoV-2 by FDA under an Emergency Use Authorization (EUA). This EUA will remain  in effect (meaning this test can be used) for the duration of the COVID-19 declaration under Section 564(b)(1) of the Act, 21 U.S.C.section 360bbb-3(b)(1), unless the authorization is terminated  or revoked sooner.       Influenza A by PCR NEGATIVE NEGATIVE   Influenza B by PCR NEGATIVE NEGATIVE    Comment: (NOTE) The Xpert Xpress SARS-CoV-2/FLU/RSV plus assay is intended as an aid in the diagnosis of influenza from Nasopharyngeal swab specimens and should not be used as a sole basis for treatment. Nasal washings and aspirates are unacceptable for Xpert Xpress SARS-CoV-2/FLU/RSV testing.  Fact Sheet for Patients: EntrepreneurPulse.com.au  Fact Sheet for Healthcare Providers: IncredibleEmployment.be  This test is not yet approved or cleared by the [place] FDA and has been authorized for detection and/or diagnosis of SARS-CoV-2 by FDA under an Emergency Use Authorization (EUA). This EUA will remain in effect (meaning this test can be used) for the duration of the COVID-19 declaration under Section 564(b)(1) of the Act, 21 U.S.C. section 360bbb-3(b)(1), unless the authorization is terminated or revoked.  Performed at [facility], [place] [address], [place], [place] [address]   Lactic acid, plasma     Status: Abnormal   Collection Time: 01/09/22  1:33 PM  Result Value Ref Range   Lactic Acid, Venous 2.8 (HH) 0.5 - 1.9 mmol/L    Comment: CRITICAL RESULT CALLED TO, READ BACK BY AND VERIFIED WITH: BRATU,D. EMTP AT 1416 01/09/22 MULLINS,T Performed at [facility], [place] [address] [place], [place] [address]   Comprehensive  metabolic panel     Status: Abnormal   Collection Time: 01/09/22  1:33 PM  Result Value Ref Range   Sodium 138 135 - 145 mmol/L   Potassium 3.0 (L) 3.5 - 5.1 mmol/L   Chloride 103 98 - 111 mmol/L   CO2 23 22 - 32 mmol/L   Glucose, Bld 190 (H) 70 - 99 mg/dL    Comment: Glucose reference range applies only to samples taken after fasting for at least 8 hours.   BUN 34 (H) 8 - 23 mg/dL   Creatinine, Ser 1.30 (H) 0.44 - 1.00 mg/dL   Calcium 9.3 8.9 - 10.3 mg/dL   Total Protein 8.4 (H) 6.5 - 8.1 g/dL   Albumin 3.6 3.5 - 5.0 g/dL   AST 26 15 - 41 U/L   ALT 19 0 - 44 U/L  Alkaline Phosphatase 91 38 - 126 U/L   Total Bilirubin 0.4 0.3 - 1.2 mg/dL   GFR, Estimated 40 (L) >60 mL/min    Comment: (NOTE) Calculated using the CKD-EPI Creatinine Equation (2021)    Anion gap 12 5 - 15    Comment: Performed at [facility], [place] [address], [place], [place] [address]  CBC WITH DIFFERENTIAL     Status: Abnormal   Collection Time: 01/09/22  1:33 PM  Result Value Ref Range   WBC 17.6 (H) 4.0 - 10.5 K/uL   RBC 3.87 3.87 - 5.11 MIL/uL   Hemoglobin 11.1 (L) 12.0 - 15.0 g/dL   HCT 34.8 (L) 36.0 - 46.0 %   MCV 89.9 80.0 - 100.0 fL   MCH 28.7 26.0 - 34.0 pg   MCHC 31.9 30.0 - 36.0 g/dL   RDW 13.2 11.5 - 15.5 %   Platelets 266 150 - 400 K/uL   nRBC 0.0 0.0 - 0.2 %   Neutrophils Relative % 77 %   Neutro Abs 13.5 (H) 1.7 - 7.7 K/uL   Lymphocytes Relative 13 %   Lymphs Abs 2.3 0.7 - 4.0 K/uL   Monocytes Relative 8 %   Monocytes Absolute 1.4 (H) 0.1 - 1.0 K/uL   Eosinophils Relative 1 %   Eosinophils Absolute 0.1 0.0 - 0.5 K/uL   Basophils Relative 0 %   Basophils Absolute 0.1 0.0 - 0.1 K/uL   Immature Granulocytes 1 %   Abs Immature Granulocytes 0.23 (H) 0.00 - 0.07 K/uL    Comment: Performed at [facility], [place] [address] [place], [place] [address]  Protime-INR     Status: None   Collection Time: 01/09/22  1:33 PM  Result Value Ref Range   Prothrombin  Time 13.8 11.4 - 15.2 seconds   INR 1.1 0.8 - 1.2    Comment: (NOTE) INR goal varies based on device and disease states. Performed at [facility], [place] [address], [place], [place] [address]   APTT     Status: None   Collection Time: 01/09/22  1:33 PM  Result Value Ref Range   aPTT 34 24 - 36 seconds    Comment: Performed at [facility], [place] [address], [place], [place] [address]  I-stat chem 8, ED (not at [facility] or [facility])     Status: Abnormal   Collection Time: 01/09/22  2:41 PM  Result Value Ref Range   Sodium 142 135 - 145 mmol/L   Potassium 3.2 (L) 3.5 - 5.1 mmol/L   Chloride 105 98 - 111 mmol/L   BUN 30 (H) 8 - 23 mg/dL   Creatinine, Ser 1.10 (H) 0.44 - 1.00 mg/dL   Glucose, Bld 135 (H) 70 - 99 mg/dL    Comment: Glucose reference range applies only to samples taken after fasting for at least 8 hours.   Calcium, Ion 1.14 (L) 1.15 - 1.40 mmol/L   TCO2 24 22 - 32 mmol/L   Hemoglobin 10.9 (L) 12.0 - 15.0 g/dL   HCT 32.0 (L) 36.0 - 46.0 %  Lactic acid, plasma     Status: None   Collection Time: 01/09/22  4:00 PM  Result Value Ref Range   Lactic Acid, Venous 1.7 0.5 - 1.9 mmol/L    Comment: Performed at [facility], [place] [address] [place],  [address]  Urinalysis, Routine w reflex microscopic Urine, Clean Catch     Status: Abnormal   Collection Time: 01/09/22  6:00 PM  Result Value Ref Range   Color, Urine STRAW (A) YELLOW   APPearance CLEAR CLEAR   Specific Gravity, Urine 1.024 1.005 - 1.030   pH 5.0 5.0 - 8.0   Glucose, UA NEGATIVE NEGATIVE mg/dL   Hgb urine dipstick NEGATIVE NEGATIVE   Bilirubin Urine NEGATIVE NEGATIVE   Ketones, ur 5 (A) NEGATIVE mg/dL   Protein, ur NEGATIVE NEGATIVE mg/dL   Nitrite NEGATIVE NEGATIVE   Leukocytes,Ua NEGATIVE NEGATIVE    Comment: Performed at [place] [address], [place], [place] [address]    CT Soft Tissue Neck W Contrast  Result Date:  01/09/2022 CLINICAL DATA:  Soft tissue swelling, infection suspected, neck xray done. Generalized weakness. Left jaw infection with redness and swelling extending into the neck. EXAM: CT NECK WITH CONTRAST TECHNIQUE: Multidetector CT imaging of the neck was performed using the standard protocol following the bolus administration of intravenous contrast. RADIATION DOSE REDUCTION: This exam was performed according to the departmental dose-optimization program which includes automated exposure control, adjustment of the mA and/or kV according to patient size and/or use of iterative reconstruction technique. CONTRAST:  71mL OMNIPAQUE IOHEXOL 300 MG/ML  SOLN COMPARISON:  None. FINDINGS: Pharynx and larynx: No mass. Patent airway. No retropharyngeal fluid collection. Salivary glands: Inflammatory changes are present in the left masticator, buccal, and submandibular spaces as well as within the overlying subcutaneous soft tissues of the face and upper neck. A low-density collection at the angle of the mandible on the left measures approximately 3.6 x 3.0 x 4.0 cm, extends inferiorly into the posterior aspect of the submandibular space, and extends superiorly in the masticator space with involvement of the masticator and pterygoid musculature. The mandible appears intact. The collection and regional inflammatory changes result in inferior displacement of the left submandibular gland which is mildly enlarged compared to the right but is more likely to be secondarily involved rather than reflecting the primary source of this inflammation. The right submandibular gland and both parotid glands are unremarkable. Thyroid: Diffusely small and otherwise grossly unremarkable within limitations of motion artifact. Lymph nodes: Borderline enlarged left level IIa lymph node measuring 1 cm in short axis, likely reactive. Vascular: Major vascular structures of the neck are grossly patent. Mild carotid atherosclerosis. Limited  intracranial: Unremarkable. Visualized orbits: Unremarkable. Mastoids and visualized paranasal sinuses: Clear. Skeleton: Edentulous aside from an unerupted/possibly supernumerary tooth in the left maxillary incisor region. Upper thoracic levoscoliosis and cervical dextroscoliosis. Upper chest: Biapical pleuroparenchymal lung scarring. Other: None. IMPRESSION: 4 cm collection at the angle of the mandible on the left consistent with abscess with extensive surrounding inflammation. Electronically Signed   By: Logan Bores M.D.   On: 01/09/2022 15:51   DG Chest Port 1 View  Result Date: 01/09/2022 CLINICAL DATA:  Sepsis. EXAM: PORTABLE CHEST 1 VIEW COMPARISON:  May 24, 2016. FINDINGS: Stable cardiomegaly. Both lungs are clear. The visualized skeletal structures are unremarkable. IMPRESSION: No active disease. Electronically Signed   By: Marijo Conception M.D.   On: 01/09/2022 15:43    QGB:EEFEOF of Systems  Constitutional:  Positive for chills and malaise/fatigue.   Blood pressure (!) 157/104, pulse (!) 135, temperature 98.1 F (36.7 C), temperature source Oral, resp. rate (!) 23, height 5\' 3"  (1.6 m), weight 54.9 kg, SpO2 94 %.  PHYSICAL EXAM: CONSTITUTIONAL: well developed, nourished, no distress and alert and oriented x 3 PULMONARY/CHEST WALL: effort normal and no stridor, no stertor, no dysphonia HENT: Head : normocephalic and atraumatic Ears: Right  ear:   canal normal, external ear normal and hearing normal Left ear:   canal normal, external ear normal and hearing normal Nose: nose normal and no purulence Mouth/Throat:  Mouth: uvula midline and no oral lesions. Edentulous. No intraoral purulence.  Throat: oropharynx clear and moist Mucous membranes: normal EYES: conjunctiva normal, EOM normal and PERRL NECK: Approximately 4cm area of induration and erythema consistent with neck abscess  Studies Reviewed:CT Neck with contrast personally reviewed. Large hypodense fluid collection at the left  angle of mandible. No dentition, no obvious source of infection.  Assessment/Plan: Karina [name] is an 88 y/o F presenting with several day history of increasing left sided neck swelling and pain. -CT neck reviewed, large hypodense fluid collection at the left angle of mandible. No dentition, no obvious source of infection. Concern for possible neoplastic process. -Recommend proceeding to OR for incision and drainage. Will obtain cultures/biopsy intraoperatively. -Patient to be admitted to hospital medicine postoperatively for medical management  Thank you for allowing me to participate in the care of this patient. Please do not hesitate to contact me with any questions or concerns.   Jason Coop, [facility] Cell: [phone]   01/09/2022, 7:33 PM

## 2022-01-09 NOTE — Assessment & Plan Note (Addendum)
Baseline hemoglobin appears to be around 11. Currently dropped slightly and hemoglobin/hematocrit is now stable at 9.5/29.8 ?-Repeat CBC within 1 week ?

## 2022-01-09 NOTE — Assessment & Plan Note (Signed)
See problem, Abscess of jaw, left ?

## 2022-01-09 NOTE — H&P (View-Only) (Signed)
ENT CONSULT:  Reason for Consult: Neck abscess  Referring Physician:  Dr. Benson Norway   HPI: Karina [name] is an 88 y.o. female who presented to ED with complaints of progressive left sided neck swelling and pain. Patient had workup in ED to include CT neck with contrast, which demonstrated a 3.6 x 3.0 x 4.0 cm low density collection at the angle of the mandible on the left. Patient was started on IV antibiotics and ENT was consulted for further management.    Past Medical History:  Diagnosis Date   ANEMIA, CHRONIC 08/13/2006   Arthritis    left knee   Atrophic vaginitis 10/03/2013   CONSTIPATION 01/28/2011   Depression with anxiety 03-22-12   Husband, [name] died in 08-09-11 after 58 years of marriage  Lost a 27 year old daughter to cancer    DIVERTICULOSIS, COLON 01/28/2011   Endometriosis    GERD (gastroesophageal reflux disease)    HTN (hypertension) 03/06/2011   Hypercholesteremia Mar 22, 2012   Lesion of external ear 03/06/2011   OAB (overactive bladder) 12/17/2015   Osteoporosis    Post menopausal problems 03/06/2011   Sinus tachycardia    mild-- retesting   SVT (supraventricular tachycardia) ([place]) 2007   3-4 beats.. Holter    Past Surgical History:  Procedure Laterality Date   ABDOMINAL HYSTERECTOMY     w/ unilateral oophorectomy-bleeding, ovarian cysts, left ovary left in place?   BREAST SURGERY     Implants--Silicone   right knee arthroscopy, cleaned      Family History  Problem Relation Age of Onset   Stroke Mother    Hypertension Mother    Other Mother        CHF   Transient ischemic attack Mother    Cancer Mother 37       cervical   Cancer Father        stomach, smoker   Cancer Sister        brain   Lymphoma Brother    Cancer Brother    Stroke Daughter        ? stroke   Mitral valve prolapse Daughter    Irritable bowel syndrome Daughter    Migraines Daughter    Hypertension Son    Other Maternal Grandmother        CHF   Alcohol abuse  Brother    Heart attack Brother        smoker   Cancer Paternal Aunt        cancer   Cancer Paternal Uncle        X 2 CA   Heart attack Paternal Uncle     Social History:  reports that she has never smoked. She has never used smokeless tobacco. She reports that she does not drink alcohol and does not use drugs.  Allergies: No Known Allergies  Medications: I have reviewed the patient's current medications.  Results for orders placed or performed during the hospital encounter of 01/09/22 (from the past 48 hour(s))  CBG monitoring, ED     Status: Abnormal   Collection Time: 01/09/22  1:28 PM  Result Value Ref Range   Glucose-Capillary 200 (H) 70 - 99 mg/dL    Comment: Glucose reference range applies only to samples taken after fasting for at least 8 hours.  Resp Panel by RT-PCR (Flu A&B, Covid)     Status: None   Collection Time: 01/09/22  1:33 PM   Specimen: Nasopharyngeal(NP) swabs in vial transport medium  Result Value Ref  Range   SARS Coronavirus 2 by RT PCR NEGATIVE NEGATIVE    Comment: (NOTE) SARS-CoV-2 target nucleic acids are NOT DETECTED.  The SARS-CoV-2 RNA is generally detectable in upper respiratory specimens during the acute phase of infection. The lowest concentration of SARS-CoV-2 viral copies this assay can detect is 138 copies/mL. A negative result does not preclude SARS-Cov-2 infection and should not be used as the sole basis for treatment or other patient management decisions. A negative result may occur with  improper specimen collection/handling, submission of specimen other than nasopharyngeal swab, presence of viral mutation(s) within the areas targeted by this assay, and inadequate number of viral copies(<138 copies/mL). A negative result must be combined with clinical observations, patient history, and epidemiological information. The expected result is Negative.  Fact Sheet for Patients:  EntrepreneurPulse.com.au  Fact Sheet for  Healthcare Providers:  IncredibleEmployment.be  This test is no t yet approved or cleared by the [place] FDA and  has been authorized for detection and/or diagnosis of SARS-CoV-2 by FDA under an Emergency Use Authorization (EUA). This EUA will remain  in effect (meaning this test can be used) for the duration of the COVID-19 declaration under Section 564(b)(1) of the Act, 21 U.S.C.section 360bbb-3(b)(1), unless the authorization is terminated  or revoked sooner.       Influenza A by PCR NEGATIVE NEGATIVE   Influenza B by PCR NEGATIVE NEGATIVE    Comment: (NOTE) The Xpert Xpress SARS-CoV-2/FLU/RSV plus assay is intended as an aid in the diagnosis of influenza from Nasopharyngeal swab specimens and should not be used as a sole basis for treatment. Nasal washings and aspirates are unacceptable for Xpert Xpress SARS-CoV-2/FLU/RSV testing.  Fact Sheet for Patients: EntrepreneurPulse.com.au  Fact Sheet for Healthcare Providers: IncredibleEmployment.be  This test is not yet approved or cleared by the [place] FDA and has been authorized for detection and/or diagnosis of SARS-CoV-2 by FDA under an Emergency Use Authorization (EUA). This EUA will remain in effect (meaning this test can be used) for the duration of the COVID-19 declaration under Section 564(b)(1) of the Act, 21 U.S.C. section 360bbb-3(b)(1), unless the authorization is terminated or revoked.  Performed at [facility], [place] [address], [place], [place] [address]   Lactic acid, plasma     Status: Abnormal   Collection Time: 01/09/22  1:33 PM  Result Value Ref Range   Lactic Acid, Venous 2.8 (HH) 0.5 - 1.9 mmol/L    Comment: CRITICAL RESULT CALLED TO, READ BACK BY AND VERIFIED WITH: BRATU,D. EMTP AT 1416 01/09/22 MULLINS,T Performed at [facility], [place] [address] [place], [place] [address]   Comprehensive  metabolic panel     Status: Abnormal   Collection Time: 01/09/22  1:33 PM  Result Value Ref Range   Sodium 138 135 - 145 mmol/L   Potassium 3.0 (L) 3.5 - 5.1 mmol/L   Chloride 103 98 - 111 mmol/L   CO2 23 22 - 32 mmol/L   Glucose, Bld 190 (H) 70 - 99 mg/dL    Comment: Glucose reference range applies only to samples taken after fasting for at least 8 hours.   BUN 34 (H) 8 - 23 mg/dL   Creatinine, Ser 1.30 (H) 0.44 - 1.00 mg/dL   Calcium 9.3 8.9 - 10.3 mg/dL   Total Protein 8.4 (H) 6.5 - 8.1 g/dL   Albumin 3.6 3.5 - 5.0 g/dL   AST 26 15 - 41 U/L   ALT 19 0 - 44 U/L  Alkaline Phosphatase 91 38 - 126 U/L   Total Bilirubin 0.4 0.3 - 1.2 mg/dL   GFR, Estimated 40 (L) >60 mL/min    Comment: (NOTE) Calculated using the CKD-EPI Creatinine Equation (2021)    Anion gap 12 5 - 15    Comment: Performed at [facility], [place] [address], [place], [place] [address]  CBC WITH DIFFERENTIAL     Status: Abnormal   Collection Time: 01/09/22  1:33 PM  Result Value Ref Range   WBC 17.6 (H) 4.0 - 10.5 K/uL   RBC 3.87 3.87 - 5.11 MIL/uL   Hemoglobin 11.1 (L) 12.0 - 15.0 g/dL   HCT 34.8 (L) 36.0 - 46.0 %   MCV 89.9 80.0 - 100.0 fL   MCH 28.7 26.0 - 34.0 pg   MCHC 31.9 30.0 - 36.0 g/dL   RDW 13.2 11.5 - 15.5 %   Platelets 266 150 - 400 K/uL   nRBC 0.0 0.0 - 0.2 %   Neutrophils Relative % 77 %   Neutro Abs 13.5 (H) 1.7 - 7.7 K/uL   Lymphocytes Relative 13 %   Lymphs Abs 2.3 0.7 - 4.0 K/uL   Monocytes Relative 8 %   Monocytes Absolute 1.4 (H) 0.1 - 1.0 K/uL   Eosinophils Relative 1 %   Eosinophils Absolute 0.1 0.0 - 0.5 K/uL   Basophils Relative 0 %   Basophils Absolute 0.1 0.0 - 0.1 K/uL   Immature Granulocytes 1 %   Abs Immature Granulocytes 0.23 (H) 0.00 - 0.07 K/uL    Comment: Performed at [facility], [place] [address] [place], [place] [address]  Protime-INR     Status: None   Collection Time: 01/09/22  1:33 PM  Result Value Ref Range   Prothrombin  Time 13.8 11.4 - 15.2 seconds   INR 1.1 0.8 - 1.2    Comment: (NOTE) INR goal varies based on device and disease states. Performed at [facility], [place] [address] [place], [place] [address]   APTT     Status: None   Collection Time: 01/09/22  1:33 PM  Result Value Ref Range   aPTT 34 24 - 36 seconds    Comment: Performed at [facility], [place] [address] [place], [place] [address]  I-stat chem 8, ED (not at [facility] or [facility])     Status: Abnormal   Collection Time: 01/09/22  2:41 PM  Result Value Ref Range   Sodium 142 135 - 145 mmol/L   Potassium 3.2 (L) 3.5 - 5.1 mmol/L   Chloride 105 98 - 111 mmol/L   BUN 30 (H) 8 - 23 mg/dL   Creatinine, Ser 1.10 (H) 0.44 - 1.00 mg/dL   Glucose, Bld 135 (H) 70 - 99 mg/dL    Comment: Glucose reference range applies only to samples taken after fasting for at least 8 hours.   Calcium, Ion 1.14 (L) 1.15 - 1.40 mmol/L   TCO2 24 22 - 32 mmol/L   Hemoglobin 10.9 (L) 12.0 - 15.0 g/dL   HCT 32.0 (L) 36.0 - 46.0 %  Lactic acid, plasma     Status: None   Collection Time: 01/09/22  4:00 PM  Result Value Ref Range   Lactic Acid, Venous 1.7 0.5 - 1.9 mmol/L    Comment: Performed at [facility] [place] [address] [place],  [address]  Urinalysis, Routine w reflex microscopic Urine, Clean Catch     Status: Abnormal   Collection Time: 01/09/22  6:00 PM  Result Value Ref Range   Color, Urine STRAW (A) YELLOW   APPearance CLEAR CLEAR   Specific Gravity, Urine 1.024 1.005 - 1.030   pH 5.0 5.0 - 8.0   Glucose, UA NEGATIVE NEGATIVE mg/dL   Hgb urine dipstick NEGATIVE NEGATIVE   Bilirubin Urine NEGATIVE NEGATIVE   Ketones, ur 5 (A) NEGATIVE mg/dL   Protein, ur NEGATIVE NEGATIVE mg/dL   Nitrite NEGATIVE NEGATIVE   Leukocytes,Ua NEGATIVE NEGATIVE    Comment: Performed at [place] [address], [place], [place] [address]    CT Soft Tissue Neck W Contrast  Result Date:  01/09/2022 CLINICAL DATA:  Soft tissue swelling, infection suspected, neck xray done. Generalized weakness. Left jaw infection with redness and swelling extending into the neck. EXAM: CT NECK WITH CONTRAST TECHNIQUE: Multidetector CT imaging of the neck was performed using the standard protocol following the bolus administration of intravenous contrast. RADIATION DOSE REDUCTION: This exam was performed according to the departmental dose-optimization program which includes automated exposure control, adjustment of the mA and/or kV according to patient size and/or use of iterative reconstruction technique. CONTRAST:  34mL OMNIPAQUE IOHEXOL 300 MG/ML  SOLN COMPARISON:  None. FINDINGS: Pharynx and larynx: No mass. Patent airway. No retropharyngeal fluid collection. Salivary glands: Inflammatory changes are present in the left masticator, buccal, and submandibular spaces as well as within the overlying subcutaneous soft tissues of the face and upper neck. A low-density collection at the angle of the mandible on the left measures approximately 3.6 x 3.0 x 4.0 cm, extends inferiorly into the posterior aspect of the submandibular space, and extends superiorly in the masticator space with involvement of the masticator and pterygoid musculature. The mandible appears intact. The collection and regional inflammatory changes result in inferior displacement of the left submandibular gland which is mildly enlarged compared to the right but is more likely to be secondarily involved rather than reflecting the primary source of this inflammation. The right submandibular gland and both parotid glands are unremarkable. Thyroid: Diffusely small and otherwise grossly unremarkable within limitations of motion artifact. Lymph nodes: Borderline enlarged left level IIa lymph node measuring 1 cm in short axis, likely reactive. Vascular: Major vascular structures of the neck are grossly patent. Mild carotid atherosclerosis. Limited  intracranial: Unremarkable. Visualized orbits: Unremarkable. Mastoids and visualized paranasal sinuses: Clear. Skeleton: Edentulous aside from an unerupted/possibly supernumerary tooth in the left maxillary incisor region. Upper thoracic levoscoliosis and cervical dextroscoliosis. Upper chest: Biapical pleuroparenchymal lung scarring. Other: None. IMPRESSION: 4 cm collection at the angle of the mandible on the left consistent with abscess with extensive surrounding inflammation. Electronically Signed   By: Logan Bores M.D.   On: 01/09/2022 15:51   DG Chest Port 1 View  Result Date: 01/09/2022 CLINICAL DATA:  Sepsis. EXAM: PORTABLE CHEST 1 VIEW COMPARISON:  May 24, 2016. FINDINGS: Stable cardiomegaly. Both lungs are clear. The visualized skeletal structures are unremarkable. IMPRESSION: No active disease. Electronically Signed   By: Marijo Conception M.D.   On: 01/09/2022 15:43    SFK:CLEXNT of Systems  Constitutional:  Positive for chills and malaise/fatigue.   Blood pressure (!) 157/104, pulse (!) 135, temperature 98.1 F (36.7 C), temperature source Oral, resp. rate (!) 23, height 5\' 3"  (1.6 m), weight 54.9 kg, SpO2 94 %.  PHYSICAL EXAM: CONSTITUTIONAL: well developed, nourished, no distress and alert and oriented x 3 PULMONARY/CHEST WALL: effort normal and no stridor, no stertor, no dysphonia HENT: Head : normocephalic and atraumatic Ears: Right  ear:   canal normal, external ear normal and hearing normal Left ear:   canal normal, external ear normal and hearing normal Nose: nose normal and no purulence Mouth/Throat:  Mouth: uvula midline and no oral lesions. Edentulous. No intraoral purulence.  Throat: oropharynx clear and moist Mucous membranes: normal EYES: conjunctiva normal, EOM normal and PERRL NECK: Approximately 4cm area of induration and erythema consistent with neck abscess  Studies Reviewed:CT Neck with contrast personally reviewed. Large hypodense fluid collection at the left  angle of mandible. No dentition, no obvious source of infection.  Assessment/Plan: Karina [name] is an 88 y/o F presenting with several day history of increasing left sided neck swelling and pain. -CT neck reviewed, large hypodense fluid collection at the left angle of mandible. No dentition, no obvious source of infection. Concern for possible neoplastic process. -Recommend proceeding to OR for incision and drainage. Will obtain cultures/biopsy intraoperatively. -Patient to be admitted to hospital medicine postoperatively for medical management  Thank you for allowing me to participate in the care of this patient. Please do not hesitate to contact me with any questions or concerns.   Jason Coop, [facility] Cell: [phone]   01/09/2022, 7:33 PM

## 2022-01-09 NOTE — Assessment & Plan Note (Addendum)
Patient is on diltiazem and metoprolol as an outpatient which are held while NPO but will be resumed at the time of discharge ?-Metoprolol 2.5 mg IV q6 hours ?-Hydralazine 10 mg IV PRN ?

## 2022-01-09 NOTE — Assessment & Plan Note (Addendum)
Patient is on Synthroid 75 mcg daily as an outpatient. Patient with a history of SVT and sinus tachycardia and is currently tachycardic. Since tachycardia likely secondary to acute illness/lack of patient taking medication this morning, will hold off on TSH testing. ?-Hold Synthroid while NPO; Synthroid 37.5 mcg IV if still NPO in 2-3 days ?-Resume Synthroid and follow-up in 4 to 6 weeks ?

## 2022-01-09 NOTE — Hospital Course (Addendum)
Karina [name] is a 88 y.o. female with a history of anemia, hypothyroidism, SVT/sinus tachycardia, hypertension, anxiety. Patient presented secondary to falling this morning. She reports failing this morning after feeling lightheaded. No associated dyspnea, chest pain or palpitations. Patient unable to provide much more history and further history provided by niece. This morning, patient's sister called and paitent told her sister than she fell on the floor. Patient sister called the patient's son and called 18. Patient does report some left jaw/neck swelling that started three days prior to admission. Patient does report taking Tylenol for some pain. She reports some nausea without emesis. ? ?Further work-up revealed that she had a left perimandibular abscess and osteomyelitis.  She is status post I&D on 01/09/2022 which grew out Enterobacter cloacae and actinomyces naeslundii.  Her urine cultures were positive for 5000 colonies of MRSE this is likely a colonization.  She was initiated on vancomycin and ceftriaxone and then changed to ampicillin sulbactam and then ertapenem.  She developed right arm PICC placed.  She steadily improved and received Decadron 8 mg every 8 for 3 doses.  She is recommended to apply warm compresses to the left neck and mandible with gentle massage.  Yesterday her WBC slightly worsened so ID recommended repeating a CT soft tissue of the neck to make sure no worsening findings but did show that she had a smaller abscess.  They recommended continuing IV ertapenem until 02/22/2022 with home health and outpatient follow-up.  She was deemed stable for discharge at this time. ?

## 2022-01-09 NOTE — ED Notes (Signed)
Pt nausea/vomit x1, emesis bag given, HOB eleavated ?

## 2022-01-09 NOTE — Anesthesia Preprocedure Evaluation (Addendum)
Anesthesia Evaluation  ?Patient identified by MRN, date of birth, ID band ?Patient awake ? ? ? ?Reviewed: ?Allergy & Precautions, NPO status , Patient's Chart, lab work & pertinent test results ? ?History of Anesthesia Complications ?Negative for: history of anesthetic complications ? ?Airway ?Mallampati: III ? ?TM Distance: >3 FB ?Neck ROM: Full ? ? ? Dental ? ?(+) Upper Dentures, Lower Dentures ?  ?Pulmonary ?neg pulmonary ROS,  ?  ?Pulmonary exam normal ? ? ? ? ? ? ? Cardiovascular ?hypertension, Pt. on medications and Pt. on home beta blockers ?+ dysrhythmias Supra Ventricular Tachycardia  ?Rhythm:Regular Rate:Tachycardia ? ? ?'20 TTE - EF 60-65%. Left ventricular diastolic Doppler parameters are consistent with impaired relaxation. Aortic valve regurgitation is mild  ? ?  ?Neuro/Psych ? Headaches, PSYCHIATRIC DISORDERS Anxiety Depression   ? GI/Hepatic ?Neg liver ROS, GERD  Medicated and Poorly Controlled,  ?Endo/Other  ?Hypothyroidism  ? Renal/GU ?Renal InsufficiencyRenal disease  ? ?  ?Musculoskeletal ? ?(+) Arthritis ,  ? Abdominal ?  ?Peds ? Hematology ? ?(+) Blood dyscrasia, anemia ,   ?Anesthesia Other Findings ?CT neck - Pharynx and larynx: No mass. Patent airway. No retropharyngeal fluid collection. ?Salivary glands: Inflammatory changes are present in the left masticator, buccal, and submandibular spaces as well as within the overlying subcutaneous soft tissues of the face and upper neck. A low-density collection at the angle of the mandible on the left measures approximately 3.6 x 3.0 x 4.0 cm, extends inferiorly into the posterior aspect of the submandibular space, and extends superiorly in the masticator space with involvement of the masticator and pterygoid musculature. The mandible appears intact. The collection and regional inflammatory changes result in inferior ?displacement of the left submandibular gland which is mildly enlarged compared to the right but is  more likely to be secondarily involved rather than reflecting the primary source of this inflammation. The right submandibular gland and both parotid glands are unremarkable. ? ? Reproductive/Obstetrics ? ?  ? ? ? ? ? ? ? ? ? ? ? ? ? ?  ?  ? ? ? ? ? ? ? ?Anesthesia Physical ?Anesthesia Plan ? ?ASA: 3 and emergent ? ?Anesthesia Plan: General  ? ?Post-op Pain Management: Ofirmev IV (intra-op)*  ? ?Induction: Intravenous and Rapid sequence ? ?PONV Risk Score and Plan: 3 and Treatment may vary due to age or medical condition, Ondansetron and TIVA ? ?Airway Management Planned: Oral ETT and Video Laryngoscope Planned ? ?Additional Equipment: None ? ?Intra-op Plan:  ? ?Post-operative Plan: Extubation in OR ? ?Informed Consent: I have reviewed the patients History and Physical, chart, labs and discussed the procedure including the risks, benefits and alternatives for the proposed anesthesia with the patient or authorized representative who has indicated his/her understanding and acceptance.  ? ? ? ?Dental advisory given ? ?Plan Discussed with: CRNA and Anesthesiologist ? ?Anesthesia Plan Comments:   ? ? ? ? ? ?Anesthesia Quick Evaluation ? ?

## 2022-01-09 NOTE — Assessment & Plan Note (Signed)
History. No current evidence of SVT. See problem, Sinus tachycardia ?

## 2022-01-09 NOTE — ED Notes (Signed)
MD with patient now.  ?

## 2022-01-09 NOTE — Interval H&P Note (Signed)
History and Physical Interval Note: ? ?01/09/2022 ?8:45 PM ? ?Karina [name]  has presented today for surgery, with the diagnosis of NECK ABSCESS.  The various methods of treatment have been discussed with the patient and family. After consideration of risks, benefits and other options for treatment, the patient has consented to  Procedure(s): ?[place] (Left) as a surgical intervention.  The patient's history has been reviewed, patient examined, no change in status, stable for surgery.  I have reviewed the patient's chart and labs.  Questions were answered to the patient's satisfaction.   ? ? ?Trixy Loyola A Ignazio Kincaid ? ? ?

## 2022-01-09 NOTE — Assessment & Plan Note (Addendum)
Her potassium was 3.0 and on admission and improved to 3.9 on last check ?

## 2022-01-10 ENCOUNTER — Encounter (HOSPITAL_COMMUNITY): Payer: Self-pay | Admitting: Otolaryngology

## 2022-01-10 ENCOUNTER — Other Ambulatory Visit: Payer: Self-pay

## 2022-01-10 DIAGNOSIS — A419 Sepsis, unspecified organism: Secondary | ICD-10-CM | POA: Diagnosis not present

## 2022-01-10 DIAGNOSIS — R6520 Severe sepsis without septic shock: Secondary | ICD-10-CM | POA: Diagnosis not present

## 2022-01-10 LAB — CBC
HCT: 30.4 % — ABNORMAL LOW (ref 36.0–46.0)
Hemoglobin: 9.9 g/dL — ABNORMAL LOW (ref 12.0–15.0)
MCH: 28.6 pg (ref 26.0–34.0)
MCHC: 32.6 g/dL (ref 30.0–36.0)
MCV: 87.9 fL (ref 80.0–100.0)
Platelets: 206 10*3/uL (ref 150–400)
RBC: 3.46 MIL/uL — ABNORMAL LOW (ref 3.87–5.11)
RDW: 13.2 % (ref 11.5–15.5)
WBC: 15.1 10*3/uL — ABNORMAL HIGH (ref 4.0–10.5)
nRBC: 0 % (ref 0.0–0.2)

## 2022-01-10 LAB — BASIC METABOLIC PANEL
Anion gap: 10 (ref 5–15)
BUN: 16 mg/dL (ref 8–23)
CO2: 22 mmol/L (ref 22–32)
Calcium: 8.4 mg/dL — ABNORMAL LOW (ref 8.9–10.3)
Chloride: 106 mmol/L (ref 98–111)
Creatinine, Ser: 1.06 mg/dL — ABNORMAL HIGH (ref 0.44–1.00)
GFR, Estimated: 51 mL/min — ABNORMAL LOW (ref >60)
Glucose, Bld: 183 mg/dL — ABNORMAL HIGH (ref 70–99)
Potassium: 3.9 mmol/L (ref 3.5–5.1)
Sodium: 138 mmol/L (ref 135–145)

## 2022-01-10 MED ORDER — LEVOTHYROXINE SODIUM 75 MCG PO TABS
75.0000 ug | ORAL_TABLET | Freq: Every day | ORAL | Status: DC
Start: 2022-01-11 — End: 2022-01-16
  Administered 2022-01-11 – 2022-01-16 (×6): 75 ug via ORAL
  Filled 2022-01-10 (×6): qty 1

## 2022-01-10 NOTE — Plan of Care (Signed)

## 2022-01-10 NOTE — Progress Notes (Signed)
ENT PROGRESS NOTE ? ? ?Subjective: ?Patient seen and examined at bedside. No issues overnight.  Patient is tolerating clear liquid diet without difficulty.  She reports pain and swelling have improved since surgery yesterday. ? ?Objective: ?Vital signs in last 24 hours: ?Temp:  [97.9 ?F (36.6 ?C)-99.9 ?F (37.7 ?C)] 97.9 ?F (36.6 ?C) (03/02 1154) ?Pulse Rate:  [86-135] 90 (03/02 1154) ?Resp:  [13-43] 19 (03/02 1154) ?BP: (129-168)/(66-104) 141/88 (03/02 1154) ?SpO2:  [94 %-99 %] 94 % (03/02 1154) ?Weight:  [54.9 kg] 54.9 kg (03/01 1326) ? ?CONSTITUTIONAL: well developed, nourished, no distress and alert and oriented x 3 ?PULMONARY/CHEST WALL: effort normal and no stridor, no stertor, no dysphonia ?HENT: Head : normocephalic and atraumatic ?Mouth/Throat:  Mouth: Penrose drain in place, secured with suture, no purulent drainage noted with massage ?EYES: conjunctiva normal, EOM normal and PERRL ?NECK: Persistent left perimandibular induration noted without palpable fluctuance, reduced in size from previous exam.  Improvement in erythema of overlying skin. ? ? ?Recent Labs  ?  01/09/22 ?1333 01/09/22 ?1441 01/10/22 ?0129  ?NA 138 142 138  ?K 3.0* 3.2* 3.9  ?CL 103 105 106  ?CO2 23  --  22  ?GLUCOSE 190* 135* 183*  ?BUN 34* 30* 16  ?CREATININE 1.30* 1.10* 1.06*  ?CALCIUM 9.3  --  8.4*  ? ? ?Medications: I have reviewed the patient's current medications. ? ?New Imaging: None ? ?Pathology: Pending ?Specimen Description ABSCESS   ?Special Requests PERIMANDIBULAR   ?Gram Stain FEW WBC PRESENT,BOTH PMN AND MONONUCLEAR  ?FEW GRAM POSITIVE COCCI IN CHAINS  ?Performed at [facility], [place] [address], [place], [place] [address]   ?Culture PENDING   ?Report Status PENDING   ? ? ?Assessment/Plan: ?Karina [name] is a 88 y/o F with left perimandibular abscess, now POD #1 status post incision and drainage. ?-Continue IV antibiotics as per primary ?-IV Decadron 8mg  Q 8H for 3 total doses ?-Follow-up intraoperative  cultures/biopsy ?-Can advance diet as tolerated ?-Recommend Peridex swish and spit every 4 hours ?-Penrose drain secured intraorally.  Will plan on removal tomorrow afternoon ?-Recommend application of warm compresses to the left neck and mandible, with gentle massage ? ? LOS: 1 day  ? ? ?[place], DO ?[facility] ?01/10/2022, 1:13 PM ? ? ? ? ? ?

## 2022-01-10 NOTE — Progress Notes (Signed)
PROGRESS NOTE    Karina [name]  EUM:[id number] DOB: [birth year] DOA: 01/09/2022 PCP: Burnard Bunting, MD    Brief Narrative:  Karina [name] is a 88 y.o. female with a history of anemia, hypothyroidism, SVT/sinus tachycardia, hypertension, anxiety. Patient presented secondary to falling this morning. She reports failing this morning after feeling lightheaded. No associated dyspnea, chest pain or palpitations. Patient unable to provide much more history and further history provided by niece. This morning, patient's sister called and paitent told her sister than she fell on the floor. Patient sister called the patient's son and called 40. Patient does report some left jaw/neck swelling that started three days prior to admission. Patient does report taking Tylenol for some pain. She reports some nausea without emesis.   3/2 feels swelling on face Lt side going down. Started on clears  Consultants:  ENT  Procedures:   Antimicrobials:  Unasyn   Subjective: No sob. Some soarness at the face  Objective: Vitals:   01/10/22 0050 01/10/22 0330 01/10/22 0741 01/10/22 1154  BP: 139/73 140/74 (!) 146/77 (!) 141/88  Pulse: (!) 103 94 86 90  Resp: 13 (!) 21 18 19   Temp: 98.7 F (37.1 C) 98.3 F (36.8 C) 98 F (36.7 C) 97.9 F (36.6 C)  TempSrc: Oral Oral Oral Oral  SpO2: 96% 97% 98% 94%  Weight:      Height:        Intake/Output Summary (Last 24 hours) at 01/10/2022 1443 Last data filed at 01/10/2022 0101 Gross per 24 hour  Intake 3247.22 ml  Output --  Net 3247.22 ml   Filed Weights   01/09/22 1326  Weight: 54.9 kg    Examination:  Calm, NAD Heent: Lt face swollen, hard to touch. Mildly TTP Cta no w/r Reg s1/s2 no gallop Soft benign +bs No edema Aaoxox3  Mood and affect appropriate in current setting     Data Reviewed: I have personally reviewed following labs and imaging studies  CBC: Recent Labs  Lab 01/09/22 1333 01/09/22 1441 01/10/22 0129  WBC 17.6*   --  15.1*  NEUTROABS 13.5*  --   --   HGB 11.1* 10.9* 9.9*  HCT 34.8* 32.0* 30.4*  MCV 89.9  --  87.9  PLT 266  --  540   Basic Metabolic Panel: Recent Labs  Lab 01/09/22 1333 01/09/22 1441 01/10/22 0129  NA 138 142 138  K 3.0* 3.2* 3.9  CL 103 105 106  CO2 23  --  22  GLUCOSE 190* 135* 183*  BUN 34* 30* 16  CREATININE 1.30* 1.10* 1.06*  CALCIUM 9.3  --  8.4*   GFR: Estimated Creatinine Clearance: 31.5 mL/min (A) (by C-G formula based on SCr of 1.06 mg/dL (H)). Liver Function Tests: Recent Labs  Lab 01/09/22 1333  AST 26  ALT 19  ALKPHOS 91  BILITOT 0.4  PROT 8.4*  ALBUMIN 3.6   No results for input(s): LIPASE, AMYLASE in the last 168 hours. No results for input(s): AMMONIA in the last 168 hours. Coagulation Profile: Recent Labs  Lab 01/09/22 1333  INR 1.1   Cardiac Enzymes: No results for input(s): CKTOTAL, CKMB, CKMBINDEX, TROPONINI in the last 168 hours. BNP (last 3 results) No results for input(s): PROBNP in the last 8760 hours. HbA1C: No results for input(s): HGBA1C in the last 72 hours. CBG: Recent Labs  Lab 01/09/22 1328  GLUCAP 200*   Lipid Profile: No results for input(s): CHOL, HDL, LDLCALC, TRIG, CHOLHDL, LDLDIRECT in the last 72  hours. Thyroid Function Tests: No results for input(s): TSH, T4TOTAL, FREET4, T3FREE, THYROIDAB in the last 72 hours. Anemia Panel: No results for input(s): VITAMINB12, FOLATE, FERRITIN, TIBC, IRON, RETICCTPCT in the last 72 hours. Sepsis Labs: Recent Labs  Lab 01/09/22 1333 01/09/22 1600  LATICACIDVEN 2.8* 1.7    Recent Results (from the past 240 hour(s))  Resp Panel by RT-PCR (Flu A&B, Covid)     Status: None   Collection Time: 01/09/22  1:33 PM   Specimen: Nasopharyngeal(NP) swabs in vial transport medium  Result Value Ref Range Status   SARS Coronavirus 2 by RT PCR NEGATIVE NEGATIVE Final    Comment: (NOTE) SARS-CoV-2 target nucleic acids are NOT DETECTED.  The SARS-CoV-2 RNA is generally detectable  in upper respiratory specimens during the acute phase of infection. The lowest concentration of SARS-CoV-2 viral copies this assay can detect is 138 copies/mL. A negative result does not preclude SARS-Cov-2 infection and should not be used as the sole basis for treatment or other patient management decisions. A negative result may occur with  improper specimen collection/handling, submission of specimen other than nasopharyngeal swab, presence of viral mutation(s) within the areas targeted by this assay, and inadequate number of viral copies(<138 copies/mL). A negative result must be combined with clinical observations, patient history, and epidemiological information. The expected result is Negative.  Fact Sheet for Patients:  EntrepreneurPulse.com.au  Fact Sheet for Healthcare Providers:  IncredibleEmployment.be  This test is no t yet approved or cleared by the [place] FDA and  has been authorized for detection and/or diagnosis of SARS-CoV-2 by FDA under an Emergency Use Authorization (EUA). This EUA will remain  in effect (meaning this test can be used) for the duration of the COVID-19 declaration under Section 564(b)(1) of the Act, 21 U.S.C.section 360bbb-3(b)(1), unless the authorization is terminated  or revoked sooner.       Influenza A by PCR NEGATIVE NEGATIVE Final   Influenza B by PCR NEGATIVE NEGATIVE Final    Comment: (NOTE) The Xpert Xpress SARS-CoV-2/FLU/RSV plus assay is intended as an aid in the diagnosis of influenza from Nasopharyngeal swab specimens and should not be used as a sole basis for treatment. Nasal washings and aspirates are unacceptable for Xpert Xpress SARS-CoV-2/FLU/RSV testing.  Fact Sheet for Patients: EntrepreneurPulse.com.au  Fact Sheet for Healthcare Providers: IncredibleEmployment.be  This test is not yet approved or cleared by the [place] FDA and has  been authorized for detection and/or diagnosis of SARS-CoV-2 by FDA under an Emergency Use Authorization (EUA). This EUA will remain in effect (meaning this test can be used) for the duration of the COVID-19 declaration under Section 564(b)(1) of the Act, 21 U.S.C. section 360bbb-3(b)(1), unless the authorization is terminated or revoked.  Performed at [facility] [place] [address] [place], [place] [address]   Blood Culture (routine x 2)     Status: None (Preliminary result)   Collection Time: 01/09/22  1:44 PM   Specimen: BLOOD  Result Value Ref Range Status   Specimen Description   Final    BLOOD RIGHT ANTECUBITAL Performed at [facility] [address], [place], [place] [address]    Special Requests   Final    BOTTLES DRAWN AEROBIC AND ANAEROBIC Blood Culture adequate volume Performed at [facility] [address], [place],  [address]    Culture   Final    NO GROWTH < 24 HOURS Performed at [facility] [address] [place], [place]  [address]    Report Status PENDING  Incomplete  Blood Culture (routine x 2)     Status: None (Preliminary result)   Collection Time: 01/09/22  6:00 PM   Specimen: BLOOD  Result Value Ref Range Status   Specimen Description   Final    BLOOD LEFT ANTECUBITAL Performed at [facility] [address] [place], [place] [address]    Special Requests   Final    BOTTLES DRAWN AEROBIC AND ANAEROBIC Blood Culture adequate volume Performed at [facility] [address] [place], [place] [address]    Culture   Final    NO GROWTH < 24 HOURS Performed at [facility] [address] [place], [place] [address]    Report Status PENDING  Incomplete  Urine Culture     Status: Abnormal (Preliminary result)   Collection Time: 01/09/22  6:00 PM   Specimen: In/Out Cath Urine  Result Value Ref Range Status   Specimen Description   Final     IN/OUT CATH URINE Performed at [facility] [address], [place], [place] [address]    Special Requests   Final    NONE Performed at [facility], [place] [address], [place], [place] [address]    Culture (A)  Final    5,000 COLONIES/mL STAPHYLOCOCCUS EPIDERMIDIS SUSCEPTIBILITIES TO FOLLOW Performed at [facility], [place] [address], [place], [place] [address]    Report Status PENDING  Incomplete  Aerobic/Anaerobic Culture w Gram Stain (surgical/deep wound)     Status: None (Preliminary result)   Collection Time: 01/09/22  9:11 PM   Specimen: PATH Bone biopsy; Tissue  Result Value Ref Range Status   Specimen Description ABSCESS  Final   Special Requests PERIMANDIBULAR  Final   Gram Stain   Final    FEW WBC PRESENT,BOTH PMN AND MONONUCLEAR FEW GRAM POSITIVE COCCI IN CHAINS Performed at [facility], [address] [place], [place] [address]    Culture PENDING  Incomplete   Report Status PENDING  Incomplete         Radiology Studies: CT Soft Tissue Neck W Contrast  Result Date: 01/09/2022 CLINICAL DATA:  Soft tissue swelling, infection suspected, neck xray done. Generalized weakness. Left jaw infection with redness and swelling extending into the neck. EXAM: CT NECK WITH CONTRAST TECHNIQUE: Multidetector CT imaging of the neck was performed using the standard protocol following the bolus administration of intravenous contrast. RADIATION DOSE REDUCTION: This exam was performed according to the departmental dose-optimization program which includes automated exposure control, adjustment of the mA and/or kV according to patient size and/or use of iterative reconstruction technique. CONTRAST:  57mL OMNIPAQUE IOHEXOL 300 MG/ML  SOLN COMPARISON:  None. FINDINGS: Pharynx and larynx: No mass. Patent airway. No retropharyngeal fluid collection. Salivary glands: Inflammatory changes are present in the left masticator, buccal, and submandibular  spaces as well as within the overlying subcutaneous soft tissues of the face and upper neck. A low-density collection at the angle of the mandible on the left measures approximately 3.6 x 3.0 x 4.0 cm, extends inferiorly into the posterior aspect of the submandibular space, and extends superiorly in the masticator space with involvement of the masticator and pterygoid musculature. The mandible appears intact. The collection and regional inflammatory changes result in inferior displacement of the left submandibular gland which is mildly enlarged compared to the right but is more likely to be secondarily involved rather than reflecting the primary source  of this inflammation. The right submandibular gland and both parotid glands are unremarkable. Thyroid: Diffusely small and otherwise grossly unremarkable within limitations of motion artifact. Lymph nodes: Borderline enlarged left level IIa lymph node measuring 1 cm in short axis, likely reactive. Vascular: Major vascular structures of the neck are grossly patent. Mild carotid atherosclerosis. Limited intracranial: Unremarkable. Visualized orbits: Unremarkable. Mastoids and visualized paranasal sinuses: Clear. Skeleton: Edentulous aside from an unerupted/possibly supernumerary tooth in the left maxillary incisor region. Upper thoracic levoscoliosis and cervical dextroscoliosis. Upper chest: Biapical pleuroparenchymal lung scarring. Other: None. IMPRESSION: 4 cm collection at the angle of the mandible on the left consistent with abscess with extensive surrounding inflammation. Electronically Signed   By: Logan Bores M.D.   On: 01/09/2022 15:51   DG Chest Port 1 View  Result Date: 01/09/2022 CLINICAL DATA:  Sepsis. EXAM: PORTABLE CHEST 1 VIEW COMPARISON:  May 24, 2016. FINDINGS: Stable cardiomegaly. Both lungs are clear. The visualized skeletal structures are unremarkable. IMPRESSION: No active disease. Electronically Signed   By: Marijo Conception M.D.   On:  01/09/2022 15:43        Scheduled Meds:  chlorhexidine  15 mL Mouth/Throat Q4H   dexamethasone (DECADRON) injection  8 mg Intravenous Q8H   metoprolol tartrate  2.5 mg Intravenous Q6H   Continuous Infusions:  sodium chloride 75 mL/hr at 01/10/22 0101   ampicillin-sulbactam (UNASYN) IV 3 g (01/10/22 1214)    Assessment & Plan:   Principal Problem:   Severe sepsis ([place]) Active Problems:   Hyperlipidemia   Chronic anemia   HTN (hypertension)   Depression with anxiety   SVT (supraventricular tachycardia) (HCC)   Sinus tachycardia   Abscess of jaw, left   Cellulitis of left jaw   Hypothyroidism   AKI (acute kidney injury) (HCC)   Nausea and vomiting   Hypokalemia   * Severe sepsis ([place])- (present on admission) Patient with leukocytosis, tachycardia and tachypnea. Urinalysis unremarkable. Blood and urine cultures obtained on admission. 3/2 due to Lt face infection below Continue IV abx wbc trending down   Cellulitis/abscess of left perimandibular ENT following, recommended transfer to [facility] for I&D On Unasyn Postop day 1 status post I&D IV Decadron 8 mg 3 times daily for 3 total doses Follow-up Intra-Op culture/biopsy Started on clears advance as tolerated Recommend Peridex swish and spit every 4 hours Plan on removal of Penrose drain tomorrow Apply warm compresses to the mandible with gentle massage     AKI (acute kidney injury) ([place]) Improved with IV fluids, appears to be at baseline     Hypothyroidism Will resume Synthroid home dose    Sinus tachycardia(present on admission) PSVT-  Patient with a history of sinus tachycardia in addition to SVT. Seen by cardiology as an outpatient. On metoprolol and diltiazem. Complicated by sepsis. -Metoprolol 2.5 mg IV q6 hours  3/2 switch to po cardiac med once able to swallow better      Depression with anxiety- (present on admission) Patient is on Xanax prn as an outpatient. Held on admission.   HTN  (hypertension)- (present on admission) Patient is on diltiazem and metoprolol as an outpatient which are held while NPO -Metoprolol 2.5 mg IV q6 hours -Hydralazine 10 mg IV PRN   Chronic anemia- (present on admission) Baseline hemoglobin appears to be around 11. Currently stable.   Hyperlipidemia- (present on admission) Patient is on simvastatin as an outpatient. Simvastatin held on admission.     DVT prophylaxis: SCD Code Status: Full Family Communication: Daughter  at bedside Disposition Plan:  Status is: Inpatient Remains inpatient appropriate because: IV treatment still unable to take oral intake                LOS: 1 day   Time spent: 35 minutes with 50% on [place], MD [facility] Pager [phone]  If 7PM-7AM, please contact night-coverage 01/10/2022, 2:43 PM

## 2022-01-11 DIAGNOSIS — A419 Sepsis, unspecified organism: Secondary | ICD-10-CM | POA: Diagnosis not present

## 2022-01-11 DIAGNOSIS — R6520 Severe sepsis without septic shock: Secondary | ICD-10-CM | POA: Diagnosis not present

## 2022-01-11 LAB — CBC
HCT: 30.3 % — ABNORMAL LOW (ref 36.0–46.0)
Hemoglobin: 9.7 g/dL — ABNORMAL LOW (ref 12.0–15.0)
MCH: 28.4 pg (ref 26.0–34.0)
MCHC: 32 g/dL (ref 30.0–36.0)
MCV: 88.6 fL (ref 80.0–100.0)
Platelets: 223 10*3/uL (ref 150–400)
RBC: 3.42 MIL/uL — ABNORMAL LOW (ref 3.87–5.11)
RDW: 13.4 % (ref 11.5–15.5)
WBC: 12.5 10*3/uL — ABNORMAL HIGH (ref 4.0–10.5)
nRBC: 0 % (ref 0.0–0.2)

## 2022-01-11 LAB — URINE CULTURE: Culture: 5000 — AB

## 2022-01-11 LAB — SURGICAL PATHOLOGY

## 2022-01-11 NOTE — Progress Notes (Signed)
ENT PROGRESS NOTE ? ? ?Subjective: ?Patient seen and examined at bedside. No issues overnight.  Patient is tolerating liquid diet without difficulty.  She reports pain and swelling have continued to improve. ? ?Objective: ?Vital signs in last 24 hours: ?Temp:  [97.3 ?F (36.3 ?C)-98.6 ?F (37 ?C)] 97.3 ?F (36.3 ?C) (03/03 1601) ?Pulse Rate:  [71-86] 80 (03/03 1601) ?Resp:  [17-20] 19 (03/03 1601) ?BP: (157-181)/(72-88) 172/88 (03/03 1601) ?SpO2:  [91 %-96 %] 96 % (03/03 1601) ? ?CONSTITUTIONAL: well developed, nourished, no distress and alert and oriented x 3 ?PULMONARY/CHEST WALL: effort normal and no stridor, no stertor, no dysphonia ?HENT: Head : normocephalic and atraumatic ?Mouth/Throat:  Mouth: Penrose drain in place, secured with suture, scant purulent drainage noted with massage. Penrose drain removed. Approximately 1.5cm mucosal defect noted.  ?EYES: conjunctiva normal, EOM normal and PERRL ?NECK: Persistent left perimandibular induration noted without palpable fluctuance, reduced in size from previous exam.  No erythema of overlying skin. ? ?Recent Labs  ?  01/09/22 ?1333 01/09/22 ?1441 01/10/22 ?0129  ?NA 138 142 138  ?K 3.0* 3.2* 3.9  ?CL 103 105 106  ?CO2 23  --  22  ?GLUCOSE 190* 135* 183*  ?BUN 34* 30* 16  ?CREATININE 1.30* 1.10* 1.06*  ?CALCIUM 9.3  --  8.4*  ? ? ?Lab Results  ?Component Value Date  ? WBC 12.5 (H) 01/11/2022  ? HGB 9.7 (L) 01/11/2022  ? HCT 30.3 (L) 01/11/2022  ? MCV 88.6 01/11/2022  ? PLT 223 01/11/2022  ? ? ? ?Medications: I have reviewed the patient's current medications. ? ?New Imaging: None ? ?Pathology:  ? ?FINAL MICROSCOPIC DIAGNOSIS:  ? ?A. SOFT TISSUE, LEFT PERIMANDIBULAR, BIOPSY:  ?- Skin and underlying soft tissue with acute and chronic inflammation, consistent with abscess  ?- Scant fragments of bone with granulation tissue, consistent with osteomyelitis  ? ?Specimen Description ABSCESS   ?Special Requests PERIMANDIBULAR   ?Gram Stain FEW WBC PRESENT,BOTH PMN AND  MONONUCLEAR  ?FEW GRAM POSITIVE COCCI IN CHAINS  ?Performed at [facility], [place] [address] [place], [place] [address]   ?Culture PENDING   ?Report Status PENDING   ? ? ?Assessment/Plan: ?Karina [name] is a 88 y/o F with left perimandibular abscess, now POD #2 status post incision and drainage. Patient clinically improved on exam.  ?-Surgical pathology c/w osteomyelitis. Indeterminate source; edentulous patient.  ?-Continue antibiotics as per primary. Recommend infectious disease consultation d/t osteomyelitis.  ?-IV Decadron 8mg  Q 8H for 3 total doses, completed ?-Can advance diet as tolerated ?-Recommend Peridex swish and spit every 4 hours, continue this until follow up ?-Penrose drain removed at bedside.  ?-Recommend application of warm compresses to the left neck and mandible, with gentle massage ?-Medical management per primary team. Patient will follow up with me in 2-3 weeks to ensure adequate healing of surgical incision. ? ? LOS: 2 days  ? ? ?[place], DO ?[facility] ?01/11/2022, 7:16 PM ? ? ? ? ? ?

## 2022-01-11 NOTE — Progress Notes (Signed)
PROGRESS NOTE    Karina [name]  IWO:[id number] DOB: 02-Oct-1933 DOA: 01/09/2022 PCP: Burnard Bunting, MD    Brief Narrative:  Karina [name] is a 88 y.o. female with a history of anemia, hypothyroidism, SVT/sinus tachycardia, hypertension, anxiety. Patient presented secondary to falling this morning. She reports failing this morning after feeling lightheaded. No associated dyspnea, chest pain or palpitations. Patient unable to provide much more history and further history provided by niece. This morning, patient's sister called and paitent told her sister than she fell on the floor. Patient sister called the patient's son and called 65. Patient does report some left jaw/neck swelling that started three days prior to admission. Patient does report taking Tylenol for some pain. She reports some nausea without emesis.   3/2 feels swelling on face Lt side going down. Started on clears 3/3 tolerated clears.  Feels less pain and swelling of the face  Consultants:  ENT  Procedures:   Antimicrobials:  Unasyn   Subjective: No shortness of breath or chest pain  Objective: Vitals:   01/10/22 2011 01/10/22 2316 01/11/22 0528 01/11/22 0733  BP: (!) 157/81 (!) 158/72 (!) 169/81 (!) 175/82  Pulse: 86 80 75 77  Resp: 20 17 20 19   Temp: 98.6 F (37 C) 98.4 F (36.9 C) 98 F (36.7 C) 98.1 F (36.7 C)  TempSrc: Oral Oral Oral Oral  SpO2: 95% 92% 91% 94%  Weight:      Height:        Intake/Output Summary (Last 24 hours) at 01/11/2022 0840 Last data filed at 01/11/2022 0530 Gross per 24 hour  Intake 720 ml  Output 1350 ml  Net -630 ml   Filed Weights   01/09/22 1326  Weight: 54.9 kg    Examination:  Calm, NAD Heent: decrease Lt mandibular swelling, erythema. Less hardness Cta no w/r Reg s1/s2 no gallop Soft benign +bs No edema Aaoxox3  Mood and affect appropriate in current setting      Data Reviewed: I have personally reviewed following labs and imaging  studies  CBC: Recent Labs  Lab 01/09/22 1333 01/09/22 1441 01/10/22 0129 01/11/22 0200  WBC 17.6*  --  15.1* 12.5*  NEUTROABS 13.5*  --   --   --   HGB 11.1* 10.9* 9.9* 9.7*  HCT 34.8* 32.0* 30.4* 30.3*  MCV 89.9  --  87.9 88.6  PLT 266  --  206 500   Basic Metabolic Panel: Recent Labs  Lab 01/09/22 1333 01/09/22 1441 01/10/22 0129  NA 138 142 138  K 3.0* 3.2* 3.9  CL 103 105 106  CO2 23  --  22  GLUCOSE 190* 135* 183*  BUN 34* 30* 16  CREATININE 1.30* 1.10* 1.06*  CALCIUM 9.3  --  8.4*   GFR: Estimated Creatinine Clearance: 31.5 mL/min (A) (by C-G formula based on SCr of 1.06 mg/dL (H)). Liver Function Tests: Recent Labs  Lab 01/09/22 1333  AST 26  ALT 19  ALKPHOS 91  BILITOT 0.4  PROT 8.4*  ALBUMIN 3.6   No results for input(s): LIPASE, AMYLASE in the last 168 hours. No results for input(s): AMMONIA in the last 168 hours. Coagulation Profile: Recent Labs  Lab 01/09/22 1333  INR 1.1   Cardiac Enzymes: No results for input(s): CKTOTAL, CKMB, CKMBINDEX, TROPONINI in the last 168 hours. BNP (last 3 results) No results for input(s): PROBNP in the last 8760 hours. HbA1C: No results for input(s): HGBA1C in the last 72 hours. CBG: Recent Labs  Lab  01/09/22 1328  GLUCAP 200*   Lipid Profile: No results for input(s): CHOL, HDL, LDLCALC, TRIG, CHOLHDL, LDLDIRECT in the last 72 hours. Thyroid Function Tests: No results for input(s): TSH, T4TOTAL, FREET4, T3FREE, THYROIDAB in the last 72 hours. Anemia Panel: No results for input(s): VITAMINB12, FOLATE, FERRITIN, TIBC, IRON, RETICCTPCT in the last 72 hours. Sepsis Labs: Recent Labs  Lab 01/09/22 1333 01/09/22 1600  LATICACIDVEN 2.8* 1.7    Recent Results (from the past 240 hour(s))  Resp Panel by RT-PCR (Flu A&B, Covid)     Status: None   Collection Time: 01/09/22  1:33 PM   Specimen: Nasopharyngeal(NP) swabs in vial transport medium  Result Value Ref Range Status   SARS Coronavirus 2 by RT PCR  NEGATIVE NEGATIVE Final    Comment: (NOTE) SARS-CoV-2 target nucleic acids are NOT DETECTED.  The SARS-CoV-2 RNA is generally detectable in upper respiratory specimens during the acute phase of infection. The lowest concentration of SARS-CoV-2 viral copies this assay can detect is 138 copies/mL. A negative result does not preclude SARS-Cov-2 infection and should not be used as the sole basis for treatment or other patient management decisions. A negative result may occur with  improper specimen collection/handling, submission of specimen other than nasopharyngeal swab, presence of viral mutation(s) within the areas targeted by this assay, and inadequate number of viral copies(<138 copies/mL). A negative result must be combined with clinical observations, patient history, and epidemiological information. The expected result is Negative.  Fact Sheet for Patients:  EntrepreneurPulse.com.au  Fact Sheet for Healthcare Providers:  IncredibleEmployment.be  This test is no t yet approved or cleared by the [place] FDA and  has been authorized for detection and/or diagnosis of SARS-CoV-2 by FDA under an Emergency Use Authorization (EUA). This EUA will remain  in effect (meaning this test can be used) for the duration of the COVID-19 declaration under Section 564(b)(1) of the Act, 21 U.S.C.section 360bbb-3(b)(1), unless the authorization is terminated  or revoked sooner.       Influenza A by PCR NEGATIVE NEGATIVE Final   Influenza B by PCR NEGATIVE NEGATIVE Final    Comment: (NOTE) The Xpert Xpress SARS-CoV-2/FLU/RSV plus assay is intended as an aid in the diagnosis of influenza from Nasopharyngeal swab specimens and should not be used as a sole basis for treatment. Nasal washings and aspirates are unacceptable for Xpert Xpress SARS-CoV-2/FLU/RSV testing.  Fact Sheet for Patients: EntrepreneurPulse.com.au  Fact Sheet for  Healthcare Providers: IncredibleEmployment.be  This test is not yet approved or cleared by the [place] FDA and has been authorized for detection and/or diagnosis of SARS-CoV-2 by FDA under an Emergency Use Authorization (EUA). This EUA will remain in effect (meaning this test can be used) for the duration of the COVID-19 declaration under Section 564(b)(1) of the Act, 21 U.S.C. section 360bbb-3(b)(1), unless the authorization is terminated or revoked.  Performed at [facility], [place] [address], [place], [place] [address]   Blood Culture (routine x 2)     Status: None (Preliminary result)   Collection Time: 01/09/22  1:44 PM   Specimen: BLOOD  Result Value Ref Range Status   Specimen Description   Final    BLOOD RIGHT ANTECUBITAL Performed at [facility] [address], [place], [place] [address]    Special Requests   Final    BOTTLES DRAWN AEROBIC AND ANAEROBIC Blood Culture adequate volume Performed at [facility] [address] [place], [place] [address]    Culture  Final    NO GROWTH 2 DAYS Performed at [facility], [place] [address], [place], [place] [address]    Report Status PENDING  Incomplete  Blood Culture (routine x 2)     Status: None (Preliminary result)   Collection Time: 01/09/22  6:00 PM   Specimen: BLOOD  Result Value Ref Range Status   Specimen Description   Final    BLOOD LEFT ANTECUBITAL Performed at [facility] [address], [place], [place] [address]    Special Requests   Final    BOTTLES DRAWN AEROBIC AND ANAEROBIC Blood Culture adequate volume Performed at [facility] [address], [place], [place] [address]    Culture   Final    NO GROWTH 2 DAYS Performed at [facility] [address], [place], [place] [address]    Report Status PENDING  Incomplete  Urine Culture     Status: Abnormal   Collection Time:  01/09/22  6:00 PM   Specimen: In/Out Cath Urine  Result Value Ref Range Status   Specimen Description   Final    IN/OUT CATH URINE Performed at [facility] [address], [place], [place] [address]    Special Requests   Final    NONE Performed at [facility], [place] [address], [place], [place] [address]    Culture 5,000 COLONIES/mL STAPHYLOCOCCUS EPIDERMIDIS (A)  Final   Report Status 01/11/2022 FINAL  Final   Organism ID, Bacteria STAPHYLOCOCCUS EPIDERMIDIS (A)  Final      Susceptibility   Staphylococcus epidermidis - MIC*    CIPROFLOXACIN <=0.5 SENSITIVE Sensitive     GENTAMICIN <=0.5 SENSITIVE Sensitive     NITROFURANTOIN <=16 SENSITIVE Sensitive     OXACILLIN >=4 RESISTANT Resistant     TETRACYCLINE >=16 RESISTANT Resistant     VANCOMYCIN 1 SENSITIVE Sensitive     TRIMETH/SULFA <=10 SENSITIVE Sensitive     CLINDAMYCIN 2 INTERMEDIATE Intermediate     RIFAMPIN 2 INTERMEDIATE Intermediate     Inducible Clindamycin NEGATIVE Sensitive     * 5,000 COLONIES/mL STAPHYLOCOCCUS EPIDERMIDIS  Aerobic/Anaerobic Culture w Gram Stain (surgical/deep wound)     Status: None (Preliminary result)   Collection Time: 01/09/22  9:11 PM   Specimen: PATH Bone biopsy; Tissue  Result Value Ref Range Status   Specimen Description ABSCESS  Final   Special Requests PERIMANDIBULAR  Final   Gram Stain   Final    FEW WBC PRESENT,BOTH PMN AND MONONUCLEAR FEW GRAM POSITIVE COCCI IN CHAINS    Culture   Final    TOO YOUNG TO READ Performed at [facility], [address] [place],  [address]    Report Status PENDING  Incomplete         Radiology Studies: CT Soft Tissue Neck W Contrast  Result Date: 01/09/2022 CLINICAL DATA:  Soft tissue swelling, infection suspected, neck xray done. Generalized weakness. Left jaw infection with redness and swelling extending into the neck. EXAM: CT NECK WITH CONTRAST TECHNIQUE: Multidetector CT imaging of the  neck was performed using the standard protocol following the bolus administration of intravenous contrast. RADIATION DOSE REDUCTION: This exam was performed according to the departmental dose-optimization program which includes automated exposure control, adjustment of the mA and/or kV according to patient size and/or use of iterative reconstruction technique. CONTRAST:  51mL OMNIPAQUE IOHEXOL 300 MG/ML  SOLN COMPARISON:  None. FINDINGS: Pharynx and larynx: No mass. Patent airway. No retropharyngeal fluid  collection. Salivary glands: Inflammatory changes are present in the left masticator, buccal, and submandibular spaces as well as within the overlying subcutaneous soft tissues of the face and upper neck. A low-density collection at the angle of the mandible on the left measures approximately 3.6 x 3.0 x 4.0 cm, extends inferiorly into the posterior aspect of the submandibular space, and extends superiorly in the masticator space with involvement of the masticator and pterygoid musculature. The mandible appears intact. The collection and regional inflammatory changes result in inferior displacement of the left submandibular gland which is mildly enlarged compared to the right but is more likely to be secondarily involved rather than reflecting the primary source of this inflammation. The right submandibular gland and both parotid glands are unremarkable. Thyroid: Diffusely small and otherwise grossly unremarkable within limitations of motion artifact. Lymph nodes: Borderline enlarged left level IIa lymph node measuring 1 cm in short axis, likely reactive. Vascular: Major vascular structures of the neck are grossly patent. Mild carotid atherosclerosis. Limited intracranial: Unremarkable. Visualized orbits: Unremarkable. Mastoids and visualized paranasal sinuses: Clear. Skeleton: Edentulous aside from an unerupted/possibly supernumerary tooth in the left maxillary incisor region. Upper thoracic levoscoliosis and  cervical dextroscoliosis. Upper chest: Biapical pleuroparenchymal lung scarring. Other: None. IMPRESSION: 4 cm collection at the angle of the mandible on the left consistent with abscess with extensive surrounding inflammation. Electronically Signed   By: Logan Bores M.D.   On: 01/09/2022 15:51   DG Chest Port 1 View  Result Date: 01/09/2022 CLINICAL DATA:  Sepsis. EXAM: PORTABLE CHEST 1 VIEW COMPARISON:  May 24, 2016. FINDINGS: Stable cardiomegaly. Both lungs are clear. The visualized skeletal structures are unremarkable. IMPRESSION: No active disease. Electronically Signed   By: Marijo Conception M.D.   On: 01/09/2022 15:43        Scheduled Meds:  chlorhexidine  15 mL Mouth/Throat Q4H   levothyroxine  75 mcg Oral Q0600   metoprolol tartrate  2.5 mg Intravenous Q6H   Continuous Infusions:  sodium chloride 50 mL/hr at 01/10/22 1456   ampicillin-sulbactam (UNASYN) IV 3 g (01/11/22 9622)    Assessment & Plan:   Principal Problem:   Severe sepsis ([place]) Active Problems:   Hyperlipidemia   Chronic anemia   HTN (hypertension)   Depression with anxiety   SVT (supraventricular tachycardia) (HCC)   Sinus tachycardia   Abscess of jaw, left   Cellulitis of left jaw   Hypothyroidism   AKI (acute kidney injury) (HCC)   Nausea and vomiting   Hypokalemia   * Severe sepsis ([employer])- (present on admission) Patient with leukocytosis, tachycardia and tachypnea. Urinalysis unremarkable. Blood and urine cultures obtained on admission. 3/3 due to left facial infection below  Continue IV antibiotics  3/2 due to Lt face infection below Continue IV abx WBC trended down   Cellulitis/abscess of left perimandibular ENT following, recommended transfer to [facility] for [facility]&D On Unasyn Postop day 1 status post I&D IV Decadron 8 mg 3 times daily for 3 total doses Follow-up Intra-Op culture/biopsy Recommend Peridex swish and spit every 4 hours 3/3 advancing diet to full liquid diet and advance as  tolerated Apply warm compresses to the mandible with gentle massage Wound culture with few WBCs gram-positive cocci in chains       AKI (acute kidney injury) ([place]) Improved with IV fluids     Hypothyroidism Continue Synthroid    Sinus tachycardia(present on admission) PSVT-  Patient with a history of sinus tachycardia in addition to SVT. Seen by cardiology as  an outpatient. On metoprolol and diltiazem. Complicated by sepsis. -Metoprolol 2.5 mg IV q6 hours  3/2 switch to po cardiac med once able to swallow better      Depression with anxiety- (present on admission) Patient is on Xanax prn as an outpatient. Held on admission.   HTN (hypertension)- (present on admission) Patient is on diltiazem and metoprolol as an outpatient which are held while NPO -Metoprolol 2.5 mg IV q6 hours -Hydralazine 10 mg IV PRN   Chronic anemia- (present on admission) Baseline hemoglobin appears to be around 11. Currently stable.   Hyperlipidemia- (present on admission) Patient is on simvastatin as an outpatient. Simvastatin held on admission.     DVT prophylaxis: SCD Code Status: Full Family Communication: Daughter at bedside Disposition Plan:  Status is: Inpatient Remains inpatient appropriate because: IV treatment still unable to take oral intake fully                LOS: 2 days   Time spent: 35 minutes with 50% on [place], MD [facility] Pager [phone]  If 7PM-7AM, please contact night-coverage 01/11/2022, 8:40 AM

## 2022-01-11 NOTE — Progress Notes (Signed)
Mobility Specialist Progress Note ? ? 01/11/22 1614  ?Mobility  ?Activity Ambulated with assistance in hallway  ?Level of Assistance Contact guard assist, steadying assist  ?Assistive Device Front wheel walker  ?Distance Ambulated (ft) 116 ft  ?Activity Response Tolerated well  ?$Mobility charge 1 Mobility  ? ?Received pt in chair w/ no complaints and agreeable. Pt requiring mod cues d/t hearing deficit and impulsiveness but requiring no physical assist. While ambulating pt desat. to 86-87% SpO2 and HR jumped 114 -118, x1 standing rest break to practiced pursed lip breathing for ~23mns. Returned back to bed w/ pt c/o slight fatigue, left w/ needs met, call bell in reach and NT in room.      ? ?Pre Mobility: 80 HR, 94% SpO2 ?During Mobility: 114 HR, 87% SpO2 ?Post Mobility: 75 HR, BP, 90% SpO2 ? ?JHolland Falling?Mobility Specialist ?Phone Number [phone]? ?

## 2022-01-11 NOTE — Progress Notes (Signed)
?  Transition of Care (TOC) Screening Note ? ? ?Patient Details  ?Name: Karina [name] ?Date of Birth: [birth year] ? ? ?Transition of Care (TOC) CM/SW Contact:    ?Dahlia Client, Romeo Rabon, RN ?Phone Number: ?01/11/2022, 1:18 PM ? ? ? ?Transition of Care Department [facility]) has reviewed patient and no TOC needs have been identified at this time. We will continue to monitor patient advancement through interdisciplinary progression rounds. If new patient transition needs arise, please place a TOC consult. ?  ?

## 2022-01-11 NOTE — Care Management Important Message (Signed)
Important Message ? ?Patient Details  ?Name: Karina [name] ?MRN: [id number] ?Date of Birth: Oct 17, 1933 ? ? ?Medicare Important Message Given:  Yes ? ? ? ? ?Shelda Altes ?01/11/2022, 10:45 AM ?

## 2022-01-11 NOTE — Evaluation (Signed)
Physical Therapy Evaluation ?Patient Details ?Name: Karina [name] ?MRN: [id number] ?DOB: 09-Jul-1933 ?Today's Date: 01/11/2022 ? ?History of Present Illness ? The pt is an 88 yo female presenting 3/1 with generalized weakness and infection in L jaw (red, swollen). S/p draining of L perimandibular abscess on 3/1, undergoing continued work up for sepsis. Family also reports a fall morning of 3/1. PMH includes: anemia, hypothyroidism, SVT, HTN, anxiety. ?  ?Clinical Impression ? Pt in bed upon arrival of PT, agreeable to evaluation at this time. Prior to admission the pt was mobilizing without AD, living alone in a single story home with level entry, driving and completing ADLs and IADLs independently. The pt has had x3 falls in last 6 months. The pt now presents with limitations in functional mobility, strength, activity tolerance, endurance, and dynamic stability due to above dx, and will continue to benefit from skilled PT to address these deficits. The pt was able to trial short distance ambulation with minG and no UE support, but had single LOB needing minA to steady and was limited to 45 ft. Will continue to benefit from skilled PT acutely and following d/c to improve endurance, stability, and to facilitate return to greater independence with community ambulation. Pt and her famiyl members in agreement with plan.  ?   ?   ? ?Recommendations for follow up therapy are one component of a multi-disciplinary discharge planning process, led by the attending physician.  Recommendations may be updated based on patient status, additional functional criteria and insurance authorization. ? ?Follow Up Recommendations Home health PT ? ?  ?Assistance Recommended at Discharge Frequent or constant Supervision/Assistance  ?Patient can return home with the following ? A little help with walking and/or transfers;Assistance with cooking/housework;Assist for transportation;Help with stairs or ramp for entrance ? ?  ?Equipment  Recommendations Rolling walker (2 wheels)  ?Recommendations for Other Services ?    ?  ?Functional Status Assessment Patient has had a recent decline in their functional status and demonstrates the ability to make significant improvements in function in a reasonable and predictable amount of time.  ? ?  ?Precautions / Restrictions Precautions ?Precautions: Fall ?Precaution Comments: pt with hx of 3 recent falls ?Restrictions ?Weight Bearing Restrictions: No  ? ?  ? ?Mobility ? Bed Mobility ?Overal bed mobility: Needs Assistance ?Bed Mobility: Supine to Sit ?  ?  ?Supine to sit: Supervision ?  ?  ?General bed mobility comments: increased time, no assist ?  ? ?Transfers ?Overall transfer level: Needs assistance ?Equipment used: None ?Transfers: Sit to/from Stand ?Sit to Stand: Min guard ?  ?  ?  ?  ?  ?General transfer comment: pt not needing UE support to stand, able to steady on her own ?  ? ?Ambulation/Gait ?Ambulation/Gait assistance: Min guard, Min assist ?Gait Distance (Feet): 45 Feet ?Assistive device: None ?Gait Pattern/deviations: Step-through pattern, Narrow base of support, Staggering right, Decreased stride length ?Gait velocity: decreased ?Gait velocity interpretation: <1.31 ft/sec, indicative of household ambulator ?  ?General Gait Details: slow guarded gait with single LOB needing minA. ? ? ?  ? ?Balance Overall balance assessment: Needs assistance, History of Falls ?Sitting-balance support: No upper extremity supported, Feet supported ?Sitting balance-Leahy Scale: Good ?  ?  ?Standing balance support: No upper extremity supported, During functional activity ?Standing balance-Leahy Scale: Fair ?Standing balance comment: able to ambulate without UE support but unable to tolerate challenge ?  ?  ?  ?  ?  ?  ?  ?  ?  ?  ?  ?   ? ? ? ?  Pertinent Vitals/Pain Pain Assessment ?Pain Assessment: No/denies pain  ? ? ?Home Living Family/patient expects to be discharged to:: Private residence ?Living Arrangements:  Alone ?Available Help at Discharge: Family;Neighbor;Friend(s);Available PRN/intermittently ?Type of Home: House ?Home Access: Level entry ?  ?  ?  ?Home Layout: One level ?Home Equipment: Conservation officer, nature (2 wheels);Cane - single point;Grab bars - tub/shower ?Additional Comments: pt receiving RW from friend, unsure what kind  ?  ?Prior Function Prior Level of Function : Independent/Modified Independent;History of Falls (last six months);Driving ?  ?  ?  ?  ?  ?  ?Mobility Comments: pt with hx of 3 falls in 6 months, reports no use of DME ?ADLs Comments: pt reports independence ?  ? ? ?Hand Dominance  ? Dominant Hand: Right ? ?  ?Extremity/Trunk Assessment  ? Upper Extremity Assessment ?Upper Extremity Assessment: Generalized weakness ?  ? ?Lower Extremity Assessment ?Lower Extremity Assessment: Generalized weakness ?  ? ?Cervical / Trunk Assessment ?Cervical / Trunk Assessment: Kyphotic  ?Communication  ? Communication: HOH  ?Cognition Arousal/Alertness: Awake/alert ?Behavior During Therapy: [facility] for tasks assessed/performed ?Overall Cognitive Status: Impaired/Different from baseline ?Area of Impairment: Safety/judgement ?  ?  ?  ?  ?  ?  ?  ?  ?  ?  ?  ?  ?Safety/Judgement: Decreased awareness of safety ?  ?  ?General Comments: pt with slightly reduced insight to safety and need for assistance ?  ?  ? ?  ?General Comments General comments (skin integrity, edema, etc.): VSS oN RA ? ?  ?   ? ?Assessment/Plan  ?  ?PT Assessment Patient needs continued PT services  ?PT Problem List Decreased strength;Decreased balance;Decreased mobility;Decreased coordination;Decreased safety awareness ? ?   ?  ?PT Treatment Interventions Gait training;Stair training;Functional mobility training;Therapeutic activities;Therapeutic exercise;Balance training   ? ?PT Goals (Current goals can be found in the Care Plan section)  ?Acute Rehab PT Goals ?Patient Stated Goal: return home ?PT Goal Formulation: With patient ?Time For Goal  Achievement: 01/25/22 ?Potential to Achieve Goals: Good ? ?  ?Frequency   ?  ? ? ?   ?AM-PAC PT "6 Clicks" Mobility  ?Outcome Measure Help needed turning from your back to your side while in a flat bed without using bedrails?: None ?Help needed moving from lying on your back to sitting on the side of a flat bed without using bedrails?: None ?Help needed moving to and from a bed to a chair (including a wheelchair)?: A Little ?Help needed standing up from a chair using your arms (e.g., wheelchair or bedside chair)?: A Little ?Help needed to walk in hospital room?: A Little ?Help needed climbing 3-5 steps with a railing? : A Little ?6 Click Score: 20 ? ?  ?End of Session Equipment Utilized During Treatment: Gait belt ?Activity Tolerance: Patient tolerated treatment well ?Patient left: in chair;with call bell/phone within reach;with family/visitor present ?Nurse Communication: Mobility status ?PT Visit Diagnosis: Other abnormalities of gait and mobility (R26.89);Repeated falls (R29.6) ?  ? ?Time: 4401-0272 ?PT Time Calculation (min) (ACUTE ONLY): 30 min ? ? ?Charges:   PT Evaluation ?$PT Eval Low Complexity: 1 Low ?PT Treatments ?$Therapeutic Exercise: 8-22 mins ?  ?   ? ? ?West Carbo, PT, DPT  ? ?Acute Rehabilitation Department ?Pager #: [phone] - [phone] ? ?Sandra Cockayne ?01/11/2022, 6:00 PM ? ?

## 2022-01-12 ENCOUNTER — Inpatient Hospital Stay: Payer: Self-pay

## 2022-01-12 DIAGNOSIS — E039 Hypothyroidism, unspecified: Secondary | ICD-10-CM | POA: Diagnosis not present

## 2022-01-12 DIAGNOSIS — A419 Sepsis, unspecified organism: Secondary | ICD-10-CM | POA: Diagnosis not present

## 2022-01-12 DIAGNOSIS — M272 Inflammatory conditions of jaws: Secondary | ICD-10-CM | POA: Diagnosis not present

## 2022-01-12 DIAGNOSIS — I471 Supraventricular tachycardia: Secondary | ICD-10-CM

## 2022-01-12 DIAGNOSIS — R6520 Severe sepsis without septic shock: Secondary | ICD-10-CM | POA: Diagnosis not present

## 2022-01-12 DIAGNOSIS — N179 Acute kidney failure, unspecified: Secondary | ICD-10-CM | POA: Diagnosis not present

## 2022-01-12 MED ORDER — PANTOPRAZOLE SODIUM 40 MG PO TBEC
40.0000 mg | DELAYED_RELEASE_TABLET | Freq: Every day | ORAL | Status: DC
  Administered 2022-01-12 – 2022-01-16 (×5): 40 mg via ORAL
  Filled 2022-01-12 (×4): qty 1

## 2022-01-12 MED ORDER — SODIUM CHLORIDE 0.9% FLUSH
10.0000 mL | Freq: Two times a day (BID) | INTRAVENOUS | Status: DC
  Administered 2022-01-13 – 2022-01-16 (×5): 10 mL

## 2022-01-12 MED ORDER — METOPROLOL SUCCINATE ER 50 MG PO TB24
50.0000 mg | ORAL_TABLET | Freq: Two times a day (BID) | ORAL | Status: DC
  Administered 2022-01-12 – 2022-01-14 (×5): 50 mg via ORAL
  Filled 2022-01-12 (×5): qty 1

## 2022-01-12 MED ORDER — SODIUM CHLORIDE 0.9% FLUSH: 10.0000 mL | INTRAVENOUS | Status: DC | PRN

## 2022-01-12 MED ORDER — SODIUM CHLORIDE 0.9 % IV SOLN
1.0000 g | INTRAVENOUS | Status: DC
  Administered 2022-01-12 – 2022-01-16 (×5): 1000 mg via INTRAVENOUS
  Filled 2022-01-12 (×5): qty 1

## 2022-01-12 MED ORDER — SIMVASTATIN 20 MG PO TABS
20.0000 mg | ORAL_TABLET | Freq: Every day | ORAL | Status: DC
  Administered 2022-01-12 – 2022-01-15 (×4): 20 mg via ORAL
  Filled 2022-01-12 (×4): qty 1

## 2022-01-12 MED ORDER — CHLORHEXIDINE GLUCONATE CLOTH 2 % EX PADS
6.0000 | MEDICATED_PAD | Freq: Every day | CUTANEOUS | Status: DC
  Administered 2022-01-12 – 2022-01-16 (×5): 6 via TOPICAL

## 2022-01-12 NOTE — Progress Notes (Signed)
Peripherally Inserted Central Catheter Placement ? ?The IV Nurse has discussed with the patient and/or persons authorized to consent for the patient, the purpose of this procedure and the potential benefits and risks involved with this procedure.  The benefits include less needle sticks, lab draws from the catheter, and the patient may be discharged home with the catheter. Risks include, but not limited to, infection, bleeding, blood clot (thrombus formation), and puncture of an artery; nerve damage and irregular heartbeat and possibility to perform a PICC exchange if needed/ordered by physician.  Alternatives to this procedure were also discussed.  Bard Power PICC patient education guide, fact sheet on infection prevention and patient information card has been provided to patient /or left at bedside.   ? ?PICC Placement Documentation  ?PICC Single Lumen 01/12/22 Right Brachial 35 cm 0 cm (Active)  ?Indication for Insertion or Continuance of Line Home intravenous therapies (PICC only) 01/12/22 1533  ?Exposed Catheter (cm) 0 cm 01/12/22 1533  ?Site Assessment Clean, Dry, Intact;Clean;Dry 01/12/22 1533  ?Line Status Flushed;Saline locked;Blood return noted 01/12/22 1533  ?Dressing Type Securing device;Transparent 01/12/22 1533  ?Dressing Status Antimicrobial disc in place 01/12/22 1533  ?Safety Lock Not Applicable 44/03/47 4259  ?Line Care Connections checked and tightened 01/12/22 1533  ?Line Adjustment (NICU/IV Team Only) No 01/12/22 1533  ?Dressing Intervention New dressing 01/12/22 1533  ?Dressing Change Due 01/19/22 01/12/22 1533  ? ? ? ? ? ?Mickel Baas  Mihaela Fajardo ?01/12/2022, 3:35 PM ? ?

## 2022-01-12 NOTE — Progress Notes (Addendum)
PROGRESS NOTE    Karina [name]  DYJ:[id number] DOB: 29-Apr-1933 DOA: 01/09/2022 PCP: Burnard Bunting, MD    Brief Narrative:  Karina [name] is a 88 y.o. female with a history of anemia, hypothyroidism, SVT/sinus tachycardia, hypertension, anxiety. Patient presented secondary to falling this morning. She reports failing this morning after feeling lightheaded. No associated dyspnea, chest pain or palpitations. Patient unable to provide much more history and further history provided by niece. This morning, patient's sister called and paitent told her sister than she fell on the floor. Patient sister called the patient's son and called 68. Patient does report some left jaw/neck swelling that started three days prior to admission. Patient does report taking Tylenol for some pain. She reports some nausea without emesis.   3/2 feels swelling on face Lt side going down. Started on clears 3/3 tolerated clears.  Feels less pain and swelling of the face 3/4 doing better, tolerated full liquid diet  Consultants:  ENT  Procedures:   Antimicrobials:  Unasyn   Subjective: No chest pain, no shortness of breath.c/o lt face soreness  Objective: Vitals:   01/11/22 1601 01/11/22 2005 01/11/22 2305 01/12/22 0319  BP: (!) 172/88 (!) 170/75 (!) 185/74 (!) 164/80  Pulse: 80     Resp: 19 20 19 20   Temp: (!) 97.3 F (36.3 C) 98.2 F (36.8 C) 97.9 F (36.6 C) 97.9 F (36.6 C)  TempSrc: Axillary Oral Oral Oral  SpO2: 96% 95% 92% 93%  Weight:      Height:        Intake/Output Summary (Last 24 hours) at 01/12/2022 0855 Last data filed at 01/12/2022 0325 Gross per 24 hour  Intake 800 ml  Output 775 ml  Net 25 ml   Filed Weights   01/09/22 1326  Weight: 54.9 kg    Examination: Calm, NAD HEENT: much less swelling, hardness. Minimal erythema Cta no w/r Reg s1/s2 no gallop Soft benign +bs No edema Aaoxox3  Mood and affect appropriate in current setting      Data Reviewed: I have  personally reviewed following labs and imaging studies  CBC: Recent Labs  Lab 01/09/22 1333 01/09/22 1441 01/10/22 0129 01/11/22 0200  WBC 17.6*  --  15.1* 12.5*  NEUTROABS 13.5*  --   --   --   HGB 11.1* 10.9* 9.9* 9.7*  HCT 34.8* 32.0* 30.4* 30.3*  MCV 89.9  --  87.9 88.6  PLT 266  --  206 403   Basic Metabolic Panel: Recent Labs  Lab 01/09/22 1333 01/09/22 1441 01/10/22 0129  NA 138 142 138  K 3.0* 3.2* 3.9  CL 103 105 106  CO2 23  --  22  GLUCOSE 190* 135* 183*  BUN 34* 30* 16  CREATININE 1.30* 1.10* 1.06*  CALCIUM 9.3  --  8.4*   GFR: Estimated Creatinine Clearance: 31.5 mL/min (A) (by C-G formula based on SCr of 1.06 mg/dL (H)). Liver Function Tests: Recent Labs  Lab 01/09/22 1333  AST 26  ALT 19  ALKPHOS 91  BILITOT 0.4  PROT 8.4*  ALBUMIN 3.6   No results for input(s): LIPASE, AMYLASE in the last 168 hours. No results for input(s): AMMONIA in the last 168 hours. Coagulation Profile: Recent Labs  Lab 01/09/22 1333  INR 1.1   Cardiac Enzymes: No results for input(s): CKTOTAL, CKMB, CKMBINDEX, TROPONINI in the last 168 hours. BNP (last 3 results) No results for input(s): PROBNP in the last 8760 hours. HbA1C: No results for input(s): HGBA1C in  the last 72 hours. CBG: Recent Labs  Lab 01/09/22 1328  GLUCAP 200*   Lipid Profile: No results for input(s): CHOL, HDL, LDLCALC, TRIG, CHOLHDL, LDLDIRECT in the last 72 hours. Thyroid Function Tests: No results for input(s): TSH, T4TOTAL, FREET4, T3FREE, THYROIDAB in the last 72 hours. Anemia Panel: No results for input(s): VITAMINB12, FOLATE, FERRITIN, TIBC, IRON, RETICCTPCT in the last 72 hours. Sepsis Labs: Recent Labs  Lab 01/09/22 1333 01/09/22 1600  LATICACIDVEN 2.8* 1.7    Recent Results (from the past 240 hour(s))  Resp Panel by RT-PCR (Flu A&B, Covid)     Status: None   Collection Time: 01/09/22  1:33 PM   Specimen: Nasopharyngeal(NP) swabs in vial transport medium  Result Value Ref  Range Status   SARS Coronavirus 2 by RT PCR NEGATIVE NEGATIVE Final    Comment: (NOTE) SARS-CoV-2 target nucleic acids are NOT DETECTED.  The SARS-CoV-2 RNA is generally detectable in upper respiratory specimens during the acute phase of infection. The lowest concentration of SARS-CoV-2 viral copies this assay can detect is 138 copies/mL. A negative result does not preclude SARS-Cov-2 infection and should not be used as the sole basis for treatment or other patient management decisions. A negative result may occur with  improper specimen collection/handling, submission of specimen other than nasopharyngeal swab, presence of viral mutation(s) within the areas targeted by this assay, and inadequate number of viral copies(<138 copies/mL). A negative result must be combined with clinical observations, patient history, and epidemiological information. The expected result is Negative.  Fact Sheet for Patients:  EntrepreneurPulse.com.au  Fact Sheet for Healthcare Providers:  IncredibleEmployment.be  This test is no t yet approved or cleared by the [place] FDA and  has been authorized for detection and/or diagnosis of SARS-CoV-2 by FDA under an Emergency Use Authorization (EUA). This EUA will remain  in effect (meaning this test can be used) for the duration of the COVID-19 declaration under Section 564(b)(1) of the Act, 21 U.S.C.section 360bbb-3(b)(1), unless the authorization is terminated  or revoked sooner.       Influenza A by PCR NEGATIVE NEGATIVE Final   Influenza B by PCR NEGATIVE NEGATIVE Final    Comment: (NOTE) The Xpert Xpress SARS-CoV-2/FLU/RSV plus assay is intended as an aid in the diagnosis of influenza from Nasopharyngeal swab specimens and should not be used as a sole basis for treatment. Nasal washings and aspirates are unacceptable for Xpert Xpress SARS-CoV-2/FLU/RSV testing.  Fact Sheet for  Patients: EntrepreneurPulse.com.au  Fact Sheet for Healthcare Providers: IncredibleEmployment.be  This test is not yet approved or cleared by the [place] FDA and has been authorized for detection and/or diagnosis of SARS-CoV-2 by FDA under an Emergency Use Authorization (EUA). This EUA will remain in effect (meaning this test can be used) for the duration of the COVID-19 declaration under Section 564(b)(1) of the Act, 21 U.S.C. section 360bbb-3(b)(1), unless the authorization is terminated or revoked.  Performed at [facility], [place] [address] [place], [place] [address]   Blood Culture (routine x 2)     Status: None (Preliminary result)   Collection Time: 01/09/22  1:44 PM   Specimen: BLOOD  Result Value Ref Range Status   Specimen Description   Final    BLOOD RIGHT ANTECUBITAL Performed at [facility] [address], [place], [place] [address]    Special Requests   Final    BOTTLES DRAWN AEROBIC AND ANAEROBIC Blood Culture adequate volume Performed at [facility]  [address] [place], [place] [address]    Culture   Final    NO GROWTH 2 DAYS Performed at [facility] [address] [place], [place] [address]    Report Status PENDING  Incomplete  Blood Culture (routine x 2)     Status: None (Preliminary result)   Collection Time: 01/09/22  6:00 PM   Specimen: BLOOD  Result Value Ref Range Status   Specimen Description   Final    BLOOD LEFT ANTECUBITAL Performed at [facility] [address] [place], [place] [address]    Special Requests   Final    BOTTLES DRAWN AEROBIC AND ANAEROBIC Blood Culture adequate volume Performed at [facility] [address] [place], [place] [address]    Culture   Final    NO GROWTH 2 DAYS Performed at [facility] [address] [place], [place] [address]    Report Status  PENDING  Incomplete  Urine Culture     Status: Abnormal   Collection Time: 01/09/22  6:00 PM   Specimen: In/Out Cath Urine  Result Value Ref Range Status   Specimen Description   Final    IN/OUT CATH URINE Performed at [facility] [address] [place], [place] [address]    Special Requests   Final    NONE Performed at [facility], [place] [address] [place], [place] [address]    Culture 5,000 COLONIES/mL STAPHYLOCOCCUS EPIDERMIDIS (A)  Final   Report Status 01/11/2022 FINAL  Final   Organism ID, Bacteria STAPHYLOCOCCUS EPIDERMIDIS (A)  Final      Susceptibility   Staphylococcus epidermidis - MIC*    CIPROFLOXACIN <=0.5 SENSITIVE Sensitive     GENTAMICIN <=0.5 SENSITIVE Sensitive     NITROFURANTOIN <=16 SENSITIVE Sensitive     OXACILLIN >=4 RESISTANT Resistant     TETRACYCLINE >=16 RESISTANT Resistant     VANCOMYCIN 1 SENSITIVE Sensitive     TRIMETH/SULFA <=10 SENSITIVE Sensitive     CLINDAMYCIN 2 INTERMEDIATE Intermediate     RIFAMPIN 2 INTERMEDIATE Intermediate     Inducible Clindamycin NEGATIVE Sensitive     * 5,000 COLONIES/mL STAPHYLOCOCCUS EPIDERMIDIS  Aerobic/Anaerobic Culture w Gram Stain (surgical/deep wound)     Status: None (Preliminary result)   Collection Time: 01/09/22  9:11 PM   Specimen: PATH Bone biopsy; Tissue  Result Value Ref Range Status   Specimen Description ABSCESS  Final   Special Requests PERIMANDIBULAR  Final   Gram Stain   Final    FEW WBC PRESENT,BOTH PMN AND MONONUCLEAR FEW GRAM POSITIVE COCCI IN CHAINS    Culture   Final    CULTURE REINCUBATED FOR BETTER GROWTH Performed at [facility], [address], [place], [place] [address]    Report Status PENDING  Incomplete         Radiology Studies: No results found.      Scheduled Meds:  chlorhexidine  15 mL Mouth/Throat Q4H   levothyroxine  75 mcg Oral Q0600   metoprolol tartrate  2.5 mg Intravenous Q6H   Continuous Infusions:   ampicillin-sulbactam (UNASYN) IV 3 g (01/12/22 0521)    Assessment & Plan:   Principal Problem:   Severe sepsis ([place]) Active Problems:   Hyperlipidemia   Chronic anemia   HTN (hypertension)   Depression with anxiety   SVT (supraventricular tachycardia) ([facility])   Sinus tachycardia   Abscess of jaw, left   Cellulitis of left jaw   Hypothyroidism  AKI (acute kidney injury) ([place])   Nausea and vomiting   Hypokalemia   * Severe sepsis ([place])- (present on admission) Patient with leukocytosis, tachycardia and tachypnea. Urinalysis unremarkable. Blood and urine cultures obtained on admission. 3/3 due to left facial infection below  Continue IV antibiotics  3/4 due to left face infection below  Continue IV antibiotics  WBC trended down      Cellulitis/abscess of left perimandibular ENT following, recommended transfer to [facility] for [facility]&D On Unasyn Postop day 1 status post I&D IV Decadron 8 mg 3 times daily for 3 total doses-completed Follow-up Intra-Op culture/biopsy Recommend Peridex swish and spit every 4 hours Apply warm compresses to the mandible with gentle massage Wound culture with few WBCs gram-positive cocci in chains 3/4 advance diet to soft diet advance as tolerated Surgical pathology reveals osteomyelitis   Lt mandibular osteomyelitis Surgical path + ID was consulted, input was appreciated-recommend switching IV antibiotics to IV ertapenem 1 g daily and plan on 6 weeks of IV therapy at home via home health Check baseline ESR, CRP Place PICC line F/u ID appointment on 01/29/2022 at 11am with Dr. Tommy Medal       AKI (acute kidney injury) [facility]) Improved with IV fluids    Hypothyroidism Continue Synthroid    Sinus tachycardia(present on admission) PSVT-  Patient with a history of sinus tachycardia in addition to SVT. Seen by cardiology as an outpatient. On metoprolol and diltiazem. Complicated by sepsis. 3/4 DC IV metoprolol Resume home meds metoprolol       Depression with anxiety- (present on admission) Patient is on Xanax prn as an outpatient. Held on admission.   HTN (hypertension)- (present on admission) Patient is on diltiazem and metoprolol as an outpatient which are held while NPO -Metoprolol 2.5 mg IV q6 hours -Hydralazine 10 mg IV PRN   Chronic anemia- (present on admission) Baseline hemoglobin appears to be around 11. Currently stable.   Hyperlipidemia- (present on admission) Patient is on simvastatin as an outpatient. Simvastatin held on admission.     DVT prophylaxis: SCD Code Status: Full Family Communication: Family at bedside Disposition Plan:  Status is: Inpatient Remains inpatient appropriate because: IV treatment, getting PICC line,                  LOS: 3 days   Time spent: 45 minutes with 50% on [place], MD [facility] Pager [phone]  If 7PM-7AM, please contact night-coverage 01/12/2022, 8:55 AM

## 2022-01-12 NOTE — Progress Notes (Signed)
PHARMACY CONSULT NOTE FOR: ? ?OUTPATIENT  PARENTERAL ANTIBIOTIC THERAPY (OPAT) ? ?Indication: mandibular osteomyelitis ?Regimen: ertapenem 1000 mg IV q24h ?End date: 02/22/2022 ? ?IV antibiotic discharge orders are pended. ?To discharging provider:  please sign these orders via discharge navigator,  ?Select New Orders & click on the button choice - Manage This Unsigned Work.  ?  ? ?Thank you for allowing pharmacy to be a part of this patient's care. ? ?Zenaida Deed, PharmD ?PGY1 Acute Care Pharmacy Resident  ?Phone: [phone] ?01/12/2022  2:17 PM ? ?Please check [email or web address] for unit-specific pharmacy phone numbers. ? ? ?

## 2022-01-12 NOTE — Consult Note (Signed)
Date of Admission:  01/09/2022          Reason for Consult: Neck abscess and mandibular osteomyelitis with Enterobacter cloaca a isolated   Referring Provider: Nolberto Hanlon, MD   Assessment:  Neck abscess with mandibular osteomyelitis and Enterobacter cloacae isolated History of SVT Hypothyroidism Yet another UNNECESSARY urine culture done on a patient with a clear cut problem on admission  Plan:  Would switch her to IV ertapenem 1 g daily and plan on 6 weeks of IV therapy at home via home health Check baseline ESR, CRP Place PICC line   Diagnosis:  Neck abscess and mandibular osteomyelitis  Culture Result: Enterobacter cloacae  No Known Allergies  OPAT Orders Discharge antibiotics to be given via PICC line Discharge antibiotics: Ertapenem 1 g IV daily Aim for Vancomycin trough 15-20 or AUC 400-550 (unless otherwise indicated) Duration: 6 weeks End Date:  02/22/2022 [facility] Care Per Protocol:  Home health RN for IV administration and teaching; PICC line care and labs.    Labs weekly while on IV antibiotics: _x_ CBC with differential __x BMP  _x_ CRP x__ ESR   _x_ Please pull PIC at completion of IV antibiotics __ Please leave PIC in place until doctor has seen patient or been notified  Fax weekly labs to [phone]  Clinic Follow Up Appt:   Karina [name] has an appointment on 01/29/2022 at 11am with Dr. Tommy Medal  The [facility] for [facility] is located in the [facility]  [place] in [place].  [address], which is located to the left of the elevators.  Phone: [phone]  Fax: [phone]  [email or web address]  Sh should should arrive 30 minutes prior to her appointment  Principal Problem:   Severe sepsis ([place]) Active Problems:   Hyperlipidemia   Chronic anemia   HTN (hypertension)   Depression with anxiety   SVT (supraventricular tachycardia) (HCC)   Sinus  tachycardia   Abscess of jaw, left   Cellulitis of left jaw   Hypothyroidism   AKI (acute kidney injury) (HCC)   Nausea and vomiting   Hypokalemia   Scheduled Meds:  chlorhexidine  15 mL Mouth/Throat Q4H   levothyroxine  75 mcg Oral Q0600   metoprolol tartrate  2.5 mg Intravenous Q6H   Continuous Infusions:  ertapenem     PRN Meds:.acetaminophen **OR** acetaminophen, hydrALAZINE, morphine injection, ondansetron **OR** ondansetron (ZOFRAN) IV  HPI: Karina [name] is a 88 y.o. female with history of hypothyroidism SVT hypertensionHas had worsening left-sided jaw and neck pain 3 days prior to admission.She then developed increasing confusion lightheadedness and ultimately fell.  Patient's sister called the son and they called 24 and the patient was found by EMS to have left-sided jaw swelling brought to the ER.  CT of the Neck showed a 3.6 x 3 x 4 point 0 cm abscess along the posterior aspect of the submandibular space into the masticator space with involve masticator and pterygoid musculature with inferior displacement the left submandibular gland adjacent to the angle of the mandible.  Patient had blood cultures taken which were appropriate and a urine culture taken which was inappropriate.  She was started on Vancomycin and ceftriaxone then narrowed to ampicillin sulbactam.  She was seen by ear nose and throat who took her to the operating room And performed I&D of left perimandibular abscess.  The mandible appeared to have healthy cortex on a visible exam in the operating room. Cultures were  taken and pathology specimen sent.  Cultures have yielded Enterobacter cloacae which will not be sensitive to amp sulbactam.  Pathology showed inflammation with granulation in bone consistent with osteomyelitis.  I will change her to IV ertapenem 1 g daily and plan on giving her 6 weeks of parenteral therapy.  I spent 85 minutes with the patient including than 50% of the time in face to face  counseling of the patient as well as her daughter and her son via telephone regarding the nature of neck infections mandibular osteomyelitis personally reviewing CT of the neck performed on admission along with review of medical records in preparation for the visit and during the visit and in coordination of her care.     Review of Systems: Review of Systems  Constitutional:  Positive for fever and malaise/fatigue. Negative for chills, diaphoresis and weight loss.  HENT:  Negative for congestion, hearing loss, sore throat and tinnitus.   Eyes:  Negative for blurred vision and double vision.  Respiratory:  Negative for cough, sputum production, shortness of breath and wheezing.   Cardiovascular:  Negative for chest pain, palpitations and leg swelling.  Gastrointestinal:  Negative for abdominal pain, blood in stool, constipation, diarrhea, heartburn, melena, nausea and vomiting.  Genitourinary:  Negative for dysuria, flank pain and hematuria.  Musculoskeletal:  Negative for back pain, falls, joint pain and myalgias.  Skin:  Negative for itching and rash.  Neurological:  Negative for dizziness, sensory change, focal weakness, loss of consciousness, weakness and headaches.  Endo/Heme/Allergies:  Does not bruise/bleed easily.  Psychiatric/Behavioral:  Negative for depression, memory loss and suicidal ideas. The patient is not nervous/anxious.    Past Medical History:  Diagnosis Date   ANEMIA, CHRONIC 08/13/2006   Arthritis    left knee   Atrophic vaginitis 10/03/2013   CONSTIPATION 01/28/2011   Depression with anxiety 03/16/2012   Husband, [name] died in 08-13-11 after 58 years of marriage  Lost a 65 year old daughter to cancer    DIVERTICULOSIS, COLON 01/28/2011   Endometriosis    GERD (gastroesophageal reflux disease)    HTN (hypertension) 03/06/2011   Hypercholesteremia 16-Mar-2012   Lesion of external ear 03/06/2011   OAB (overactive bladder) 12/17/2015   Osteoporosis     Post menopausal problems 03/06/2011   Sinus tachycardia    mild-- retesting   SVT (supraventricular tachycardia) ([place]) 2007   3-4 beats.. Holter    Social History   Tobacco Use   Smoking status: Never   Smokeless tobacco: Never  Substance Use Topics   Alcohol use: No    Alcohol/week: 0.0 standard drinks   Drug use: No    Family History  Problem Relation Age of Onset   Stroke Mother    Hypertension Mother    Other Mother        CHF   Transient ischemic attack Mother    Cancer Mother 17       cervical   Cancer Father        stomach, smoker   Cancer Sister        brain   Lymphoma Brother    Cancer Brother    Stroke Daughter        ? stroke   Mitral valve prolapse Daughter    Irritable bowel syndrome Daughter    Migraines Daughter    Hypertension Son    Other Maternal Grandmother        CHF   Alcohol abuse Brother    Heart attack Brother  smoker   Cancer Paternal Aunt        cancer   Cancer Paternal Uncle        X 2 CA   Heart attack Paternal Uncle    No Known Allergies  OBJECTIVE: Blood pressure (!) 157/78, pulse 78, temperature 97.8 F (36.6 C), temperature source Oral, resp. rate 18, height 5' 3"  (1.6 m), weight 54.9 kg, SpO2 95 %.  Physical Exam Constitutional:      General: She is not in acute distress.    Appearance: Normal appearance. She is well-developed. She is not ill-appearing or diaphoretic.  HENT:     Head: Normocephalic and atraumatic.     Comments: He still has some residual neck swelling    Right Ear: Hearing and external ear normal.     Left Ear: Hearing and external ear normal.     Nose: No nasal deformity or rhinorrhea.     Mouth/Throat:     Comments: Surgical site clean Eyes:     General: No scleral icterus.    Extraocular Movements: Extraocular movements intact.     Conjunctiva/sclera: Conjunctivae normal.     Right eye: Right conjunctiva is not injected.     Left eye: Left conjunctiva is not injected.     Pupils:  Pupils are equal, round, and reactive to light.  Neck:     Vascular: No JVD.  Cardiovascular:     Rate and Rhythm: Normal rate and regular rhythm.     Heart sounds: Normal heart sounds, S1 normal and S2 normal. No murmur heard.   No friction rub.  Abdominal:     General: Bowel sounds are normal. There is no distension.     Palpations: Abdomen is soft.     Tenderness: There is no abdominal tenderness.  Musculoskeletal:        General: Normal range of motion.     Right shoulder: Normal.     Left shoulder: Normal.     Cervical back: Normal range of motion and neck supple.     Right hip: Normal.     Left hip: Normal.     Right knee: Normal.     Left knee: Normal.  Lymphadenopathy:     Head:     Right side of head: No submandibular, preauricular or posterior auricular adenopathy.     Left side of head: No submandibular, preauricular or posterior auricular adenopathy.     Cervical: No cervical adenopathy.     Right cervical: No superficial or deep cervical adenopathy.    Left cervical: No superficial or deep cervical adenopathy.  Skin:    General: Skin is warm and dry.     Coloration: Skin is not pale.     Findings: No abrasion, bruising, ecchymosis, erythema, lesion or rash.     Nails: There is no clubbing.  Neurological:     Mental Status: She is alert and oriented to person, place, and time.     Sensory: No sensory deficit.     Coordination: Coordination normal.     Gait: Gait normal.  Psychiatric:        Attention and Perception: She is attentive.        Mood and Affect: Mood normal.        Speech: Speech normal.        Behavior: Behavior normal. Behavior is cooperative.        Thought Content: Thought content normal.        Judgment: Judgment normal.  Lab Results Lab Results  Component Value Date   WBC 12.5 (H) 01/11/2022   HGB 9.7 (L) 01/11/2022   HCT 30.3 (L) 01/11/2022   MCV 88.6 01/11/2022   PLT 223 01/11/2022    Lab Results  Component Value Date    CREATININE 1.06 (H) 01/10/2022   BUN 16 01/10/2022   NA 138 01/10/2022   K 3.9 01/10/2022   CL 106 01/10/2022   CO2 22 01/10/2022    Lab Results  Component Value Date   ALT 19 01/09/2022   AST 26 01/09/2022   ALKPHOS 91 01/09/2022   BILITOT 0.4 01/09/2022     Microbiology: Recent Results (from the past 240 hour(s))  Resp Panel by RT-PCR (Flu A&B, Covid)     Status: None   Collection Time: 01/09/22  1:33 PM   Specimen: Nasopharyngeal(NP) swabs in vial transport medium  Result Value Ref Range Status   SARS Coronavirus 2 by RT PCR NEGATIVE NEGATIVE Final    Comment: (NOTE) SARS-CoV-2 target nucleic acids are NOT DETECTED.  The SARS-CoV-2 RNA is generally detectable in upper respiratory specimens during the acute phase of infection. The lowest concentration of SARS-CoV-2 viral copies this assay can detect is 138 copies/mL. A negative result does not preclude SARS-Cov-2 infection and should not be used as the sole basis for treatment or other patient management decisions. A negative result may occur with  improper specimen collection/handling, submission of specimen other than nasopharyngeal swab, presence of viral mutation(s) within the areas targeted by this assay, and inadequate number of viral copies(<138 copies/mL). A negative result must be combined with clinical observations, patient history, and epidemiological information. The expected result is Negative.  Fact Sheet for Patients:  EntrepreneurPulse.com.au  Fact Sheet for Healthcare Providers:  IncredibleEmployment.be  This test is no t yet approved or cleared by the [place] FDA and  has been authorized for detection and/or diagnosis of SARS-CoV-2 by FDA under an Emergency Use Authorization (EUA). This EUA will remain  in effect (meaning this test can be used) for the duration of the COVID-19 declaration under Section 564(b)(1) of the Act, 21 U.S.C.section 360bbb-3(b)(1),  unless the authorization is terminated  or revoked sooner.       Influenza A by PCR NEGATIVE NEGATIVE Final   Influenza B by PCR NEGATIVE NEGATIVE Final    Comment: (NOTE) The Xpert Xpress SARS-CoV-2/FLU/RSV plus assay is intended as an aid in the diagnosis of influenza from Nasopharyngeal swab specimens and should not be used as a sole basis for treatment. Nasal washings and aspirates are unacceptable for Xpert Xpress SARS-CoV-2/FLU/RSV testing.  Fact Sheet for Patients: EntrepreneurPulse.com.au  Fact Sheet for Healthcare Providers: IncredibleEmployment.be  This test is not yet approved or cleared by the [place] FDA and has been authorized for detection and/or diagnosis of SARS-CoV-2 by FDA under an Emergency Use Authorization (EUA). This EUA will remain in effect (meaning this test can be used) for the duration of the COVID-19 declaration under Section 564(b)(1) of the Act, 21 U.S.C. section 360bbb-3(b)(1), unless the authorization is terminated or revoked.  Performed at [facility], [place] [address], [place], [place] [address]   Blood Culture (routine x 2)     Status: None (Preliminary result)   Collection Time: 01/09/22  1:44 PM   Specimen: BLOOD  Result Value Ref Range Status   Specimen Description   Final    BLOOD RIGHT ANTECUBITAL Performed at [facility] [address] [place], [place] [address]  Special Requests   Final    BOTTLES DRAWN AEROBIC AND ANAEROBIC Blood Culture adequate volume Performed at [facility] [address] [place], [place] [address]    Culture   Final    NO GROWTH 3 DAYS Performed at [facility], [place] [address], [place], [place] [address]    Report Status PENDING  Incomplete  Blood Culture (routine x 2)     Status: None (Preliminary result)   Collection Time: 01/09/22  6:00 PM   Specimen: BLOOD  Result Value Ref Range Status    Specimen Description   Final    BLOOD LEFT ANTECUBITAL Performed at [facility] [address], [place], [place] [address]    Special Requests   Final    BOTTLES DRAWN AEROBIC AND ANAEROBIC Blood Culture adequate volume Performed at [facility] [address], [place], [place] [address]    Culture   Final    NO GROWTH 3 DAYS Performed at [facility], [place] [address], [place], [place] [address]    Report Status PENDING  Incomplete  Urine Culture     Status: Abnormal   Collection Time: 01/09/22  6:00 PM   Specimen: In/Out Cath Urine  Result Value Ref Range Status   Specimen Description   Final    IN/OUT CATH URINE Performed at [facility] [address], [place], [place] [address]    Special Requests   Final    NONE Performed at [facility], [place] [address], [place], [place] [address]    Culture 5,000 COLONIES/mL STAPHYLOCOCCUS EPIDERMIDIS (A)  Final   Report Status 01/11/2022 FINAL  Final   Organism ID, Bacteria STAPHYLOCOCCUS EPIDERMIDIS (A)  Final      Susceptibility   Staphylococcus epidermidis - MIC*    CIPROFLOXACIN <=0.5 SENSITIVE Sensitive     GENTAMICIN <=0.5 SENSITIVE Sensitive     NITROFURANTOIN <=16 SENSITIVE Sensitive     OXACILLIN >=4 RESISTANT Resistant     TETRACYCLINE >=16 RESISTANT Resistant     VANCOMYCIN 1 SENSITIVE Sensitive     TRIMETH/SULFA <=10 SENSITIVE Sensitive     CLINDAMYCIN 2 INTERMEDIATE Intermediate     RIFAMPIN 2 INTERMEDIATE Intermediate     Inducible Clindamycin NEGATIVE Sensitive     * 5,000 COLONIES/mL STAPHYLOCOCCUS EPIDERMIDIS  Aerobic/Anaerobic Culture w Gram Stain (surgical/deep wound)     Status: None (Preliminary result)   Collection Time: 01/09/22  9:11 PM   Specimen: PATH Bone biopsy; Tissue  Result Value Ref Range Status   Specimen Description ABSCESS  Final   Special Requests PERIMANDIBULAR  Final   Gram Stain   Final    FEW WBC  PRESENT,BOTH PMN AND MONONUCLEAR FEW GRAM POSITIVE COCCI IN CHAINS    Culture   Final    FEW ENTEROBACTER CLOACAE CULTURE REINCUBATED FOR BETTER GROWTH Performed at [facility], [place] [address] [place], [place] [address]    Report Status PENDING  Incomplete   Organism ID, Bacteria ENTEROBACTER CLOACAE  Final      Susceptibility   Enterobacter cloacae - MIC*    CEFAZOLIN >=64 RESISTANT Resistant     CEFEPIME <=0.12 SENSITIVE Sensitive     CEFTAZIDIME <=1 SENSITIVE Sensitive     CIPROFLOXACIN <=0.25 SENSITIVE Sensitive     GENTAMICIN <=1 SENSITIVE Sensitive     IMIPENEM 0.5 SENSITIVE Sensitive     TRIMETH/SULFA <=20 SENSITIVE Sensitive     PIP/TAZO <=4 SENSITIVE Sensitive     *  FEW ENTEROBACTER [place], [facility] [place] [facility] [phone] pager  01/12/2022, 1:30 PM

## 2022-01-12 NOTE — Progress Notes (Signed)
Physical Therapy Treatment ?Patient Details ?Name: Karina [name] ?MRN: [id number] ?DOB: July 30, 1933 ?Today's Date: 01/12/2022 ? ? ?History of Present Illness The pt is an 88 yo female presenting 3/1 with generalized weakness and infection in L jaw (red, swollen). S/p draining of L perimandibular abscess on 3/1, undergoing continued work up for sepsis. Family also reports a fall morning of 3/1. PMH includes: anemia, hypothyroidism, SVT, HTN, anxiety. ? ?  ?PT Comments  ? ? Focused session on improving pt's dynamic gait balance through having pt challenge her vestibular system and negotiate around and over obstacles. Pt with x3 LOB initially, primarily when challenging vestibular system or trying to stand in SLS to step over an obstacle, needing minA to recover. With increased repetitions of these challenges, pt's balance did improve where she no longer had LOB by end of session, but still displayed a slow and guarded gait pattern. Pt's DGI start of session was 11 and by end of session it was 14, further supporting her improved balance with increased exposure to challenges. Pt is still at risk for falls though. Educated pt that in mornings to sit up EOB for a little bit and perform LAQs (10x each) and then some mini push ups/squats from EOB before coming to full stand to wake up muscles and then stand at EOB to ensure no dizziness/lightheadedness before ambulating as pt reported her x2 falls recently were when her legs gave out getting up first thing in the morning. Will continue to follow acutely. Current recommendations remain appropriate. ? ?   ?Recommendations for follow up therapy are one component of a multi-disciplinary discharge planning process, led by the attending physician.  Recommendations may be updated based on patient status, additional functional criteria and insurance authorization. ? ?Follow Up Recommendations ? Home health PT ?  ?  ?Assistance Recommended at Discharge Frequent or constant  Supervision/Assistance  ?Patient can return home with the following A little help with walking and/or transfers;Assistance with cooking/housework;Assist for transportation;Help with stairs or ramp for entrance ?  ?Equipment Recommendations ? Rolling walker (2 wheels)  ?  ?Recommendations for Other Services   ? ? ?  ?Precautions / Restrictions Precautions ?Precautions: Fall ?Precaution Comments: pt with hx of 3 recent falls ?Restrictions ?Weight Bearing Restrictions: No  ?  ? ?Mobility ? Bed Mobility ?Overal bed mobility: Needs Assistance ?Bed Mobility: Supine to Sit ?  ?  ?Supine to sit: Supervision, HOB elevated ?  ?  ?General bed mobility comments: increased time, no assist ?  ? ?Transfers ?Overall transfer level: Needs assistance ?Equipment used: None ?Transfers: Sit to/from Stand ?Sit to Stand: Supervision ?  ?  ?  ?  ?  ?General transfer comment: Able to come to stand without LOB, supervision for safety ?  ? ?Ambulation/Gait ?Ambulation/Gait assistance: Min guard, Min assist ?Gait Distance (Feet): 350 Feet ?Assistive device: None ?Gait Pattern/deviations: Step-through pattern, Narrow base of support, Decreased stride length ?Gait velocity: decreased ?Gait velocity interpretation: 1.31 - 2.62 ft/sec, indicative of limited community ambulator ?  ?General Gait Details: Pt with slow guarded gait and legs slightly tremulous initially. As distance progressed, pt was able to increase speed and displayed more stable legs (no longer tremulous). Pt still with slowed gait though and some mild instability. x3 minor LOB bouts in which pt staggered laterally or leaned posteriorly, primarily when turning head or stepping over obstacle. MinA to recover. Had pt change speeds, directions, turn and stop, turn and nod head, and step over obstacles and even perform an obstacle  course consisting of weaving between obstacles, around obstacles and over obstacles. Had pt change head positions playing "I Spy" while ambulating. Improved  stability with stepping over and changing head position with increased reps, no further LOB but still guarded. ? ? ?Stairs ?  ?  ?  ?  ?  ? ? ?Wheelchair Mobility ?  ? ?Modified Rankin (Stroke Patients Only) ?  ? ? ?  ?Balance Overall balance assessment: Needs assistance, History of Falls ?Sitting-balance support: No upper extremity supported, Feet supported ?Sitting balance-Leahy Scale: Good ?  ?  ?Standing balance support: No upper extremity supported, During functional activity ?Standing balance-Leahy Scale: Fair ?Standing balance comment: able to ambulate without UE support but mild instability and x3 LOB with challenges needing minA to recover ?  ?  ?  ?  ?  ?  ?  ?  ?Standardized Balance Assessment ?Standardized Balance Assessment : Dynamic Gait Index ?  ?Dynamic Gait Index ?Level Surface: Mild Impairment ?Change in Gait Speed: Normal ?Gait with Horizontal Head Turns: Severe Impairment (1 by end of session) ?Gait with Vertical Head Turns: Moderate Impairment ?Gait and Pivot Turn: Normal ?Step Over Obstacle: Severe Impairment (2 by end of session) ?Step Around Obstacles: Moderate Impairment ?Steps: Moderate Impairment (assumed) ?Total Score: 11 ?  ? ?  ?Cognition Arousal/Alertness: Awake/alert ?Behavior During Therapy: [facility] for tasks assessed/performed ?Overall Cognitive Status: Within Functional Limits for tasks assessed ?  ?  ?  ?  ?  ?  ?  ?  ?  ?  ?  ?  ?  ?  ?  ?  ?  ?  ?  ? ?  ?Exercises Other Exercises ?Other Exercises: Dynamic gait balance training through having pt negotiate obstacle consisting of weaving between obstacles, stepping over obstacles, and negotiating around obstacles ?Other Exercises: Dynamic gait balance training having pt play "I Spy" to find objects of various colors, changing head position while ambulating ? ?  ?General Comments General comments (skin integrity, edema, etc.): educated pt in mornings to sit up EOB for a little bit and perform LAQs (10x each) and then some mini push  ups/squats from EOB before coming to full stand to wake up muscles and then stand at EOB to ensure no dizziness/lightheadedness before ambulating as pt reported her x2 falls were when legs gave out getting up first thing in morning ?  ?  ? ?Pertinent Vitals/Pain Pain Assessment ?Pain Assessment: No/denies pain  ? ? ?Home Living   ?  ?  ?  ?  ?  ?  ?  ?  ?  ?   ?  ?Prior Function    ?  ?  ?   ? ?PT Goals (current goals can now be found in the care plan section) Acute Rehab PT Goals ?Patient Stated Goal: return home without RW ?PT Goal Formulation: With patient/family ?Time For Goal Achievement: 01/25/22 ?Potential to Achieve Goals: Good ?Progress towards PT goals: Progressing toward goals ? ?  ?Frequency ? ? ? Min 3X/week ? ? ? ?  ?PT Plan Current plan remains appropriate  ? ? ?Co-evaluation   ?  ?  ?  ?  ? ?  ?AM-PAC PT "6 Clicks" Mobility   ?Outcome Measure ? Help needed turning from your back to your side while in a flat bed without using bedrails?: None ?Help needed moving from lying on your back to sitting on the side of a flat bed without using bedrails?: None ?Help needed moving to and from a bed  to a chair (including a wheelchair)?: A Little ?Help needed standing up from a chair using your arms (e.g., wheelchair or bedside chair)?: A Little ?Help needed to walk in hospital room?: A Little ?Help needed climbing 3-5 steps with a railing? : A Little ?6 Click Score: 20 ? ?  ?End of Session   ?Activity Tolerance: Patient tolerated treatment well ?Patient left: in chair;with call bell/phone within reach;with family/visitor present ?Nurse Communication: Mobility status ?PT Visit Diagnosis: Other abnormalities of gait and mobility (R26.89);Repeated falls (R29.6);Unsteadiness on feet (R26.81) ?  ? ? ?Time: 5670-1410 ?PT Time Calculation (min) (ACUTE ONLY): 19 min ? ?Charges:  $Gait Training: 8-22 mins          ?          ? ?Moishe Spice, PT, DPT ?Acute Rehabilitation Services  ?Pager: [phone] ?Office:  [phone] ? ? ? ?Maretta Bees Pettis ?01/12/2022, 1:57 PM ? ?

## 2022-01-12 NOTE — TOC Initial Note (Addendum)
Transition of Care (TOC) - Initial/Assessment Note  ? ? ?Patient Details  ?Name: Karina [name] ?MRN: [id number] ?Date of Birth: Jan 29, 1933 ? ?Transition of Care (TOC) CM/SW Contact:    ?Graves-Bigelow, Ocie Cornfield, RN ?Phone Number: ?01/12/2022, 3:01 PM ? ?Clinical Narrative: Case Manager received a secure chat that the patient will need home IV antibiotics. Patient is independent from home and has support of a [place]; [place]. Case Manager spoke with the son via telephone and he felt that insurance should provide a RN daily to perform the antibiotic task. Case Manager educated the family that insurance only allows RN to come out at least 2-3 times per week and this entails family support. Patient has a sister Lelan Pons that is willing to assist with the IV antibiotic therapy. Lelan Pons is aware that the infusion will be a mini bag that infuses over 30 minutes. Lelan Pons is agreeable to the education from [facility]. Dana will contact Lelan Pons at [phone] via text regarding education (videos) and when to meet at the hospital. Medicare.gov list provided to the family and family is agreeable to [facility]. Referral submitted to [facility] and the agency can accept the patient. Start of care to begin within 24-48 hours post transition home. MD and Staff RN aware and agreeable that we can safely get the patient home on Monday with [facility] RN and PT services. MD aware to place [facility] Orders and F2F with the OPAT order signed. Weekend Case Manager to further assist with additional needs.       ? ? ?1533 01-12-22 [employer] has to submit authorization to [facility] for [employer] for pharmacy- infusion- DME needs. [facility] may not get authorization until Monday. Weekend Case Manager to assist with additional needs.  ? ? ?Expected Discharge Plan: [place] ?Barriers to Discharge: Continued Medical Work up ? ? ?Patient Goals and CMS Choice ?Patient states their goals for  this hospitalization and ongoing recovery are:: to return home with [place]. ?CMS Medicare.gov Compare Post Acute Care list provided to:: Patient ?Choice offered to / list presented to : Patient, Adult Children (Spoke with son Richardson Landry via the telephone.) ? ?Expected Discharge Plan and Services ?Expected Discharge Plan: [place] ?  ?Discharge Planning Services: CM Consult ?Post Acute Care Choice: Durable Medical Equipment, Home Health ?Living arrangements for the past 2 months:  (Condo) ?   ?  ?HH Arranged: RN, Disease Management, PT ?[place] Agency: [place] ?Date HH Agency Contacted: 01/12/22 ?Time [place]: 1500 ?Representative spoke with at [place]: Tommi Rumps ? ?Prior Living Arrangements/Services ?Living arrangements for the past 2 months:  (Condo) ?Lives with:: Self ?Patient language and need for interpreter reviewed:: Yes ?Do you feel safe going back to the place where you live?: Yes      ?Need for Family Participation in Patient Care: Yes (Comment) ?Care giver support system in place?: Yes (comment) ?  ?Criminal Activity/Legal Involvement Pertinent to Current Situation/Hospitalization: No - Comment as needed ? ?Activities of Daily Living ?Home Assistive Devices/Equipment: None ?ADL Screening (condition at time of admission) ?Patient's cognitive ability adequate to safely complete daily activities?: Yes ?Is the patient deaf or have difficulty hearing?: No ?Does the patient have difficulty seeing, even when wearing glasses/contacts?: No ?Does the patient have difficulty concentrating, remembering, or making decisions?: No ?Patient able to express need for assistance with ADLs?: Yes ?Does the patient have difficulty dressing or bathing?:  No ?Independently performs ADLs?: Yes (appropriate for developmental age) ?Does the patient have difficulty walking or climbing stairs?: No ?Weakness of Legs: None ?Weakness of Arms/Hands: None ? ?Permission  Sought/Granted ?Permission sought to share information with : Family Supports, Customer service manager, Case Manager ?Permission granted to share information with : Yes, Verbal Permission Granted ?   ? Permission granted to share info w AGENCY: [place], [place] ?   ?   ? ?Emotional Assessment ?Appearance:: Appears stated age ?Attitude/Demeanor/Rapport: Engaged ?Affect (typically observed): Accepting, Appropriate ?Orientation: : Oriented to Place, Oriented to  Time, Oriented to Situation ?Alcohol / Substance Use: Not Applicable ?Psych Involvement: No (comment) ? ?Admission diagnosis:  Sepsis ([place]) [A41.9] ?Patient Active Problem List  ? Diagnosis Date Noted  ? Severe sepsis ([place]) 01/09/2022  ? Abscess of jaw, left 01/09/2022  ? Cellulitis of left jaw 01/09/2022  ? Hypothyroidism 01/09/2022  ? AKI (acute kidney injury) ([place]) 01/09/2022  ? Nausea and vomiting 01/09/2022  ? Hypokalemia 01/09/2022  ? Carotid artery disease ([place]) 07/10/2021  ? Toe pain, left 06/19/2016  ? OAB (overactive bladder) 12/17/2015  ? Hearing loss 05/09/2014  ? Neck pain, chronic 05/09/2014  ? Headache(784.0) 11/02/2013  ? Lichen sclerosus et atrophicus of the vulva 08/10/2013  ? Knee pain, bilateral 02/02/2013  ? Pes anserine bursitis 01/01/2013  ? SVT (supraventricular tachycardia) ([place])   ? Sinus tachycardia   ? Depression with anxiety 03/10/2012  ? HTN (hypertension) 03/06/2011  ? Postmenopausal status 03/06/2011  ? DIVERTICULOSIS, COLON 01/28/2011  ? Constipation 01/28/2011  ? GERD 12/28/2007  ? Hyperlipidemia 12/17/2007  ? Chronic anemia 08/13/2006  ? Osteoporosis 08/13/2006  ? ?PCP:  Burnard Bunting, MD ?Pharmacy:   ?[facility] [place], [place] ?[place] ?[place] [address] ?Phone: [phone] Fax: [phone] ? ?[place], [place] ?[place] ?[place] [address] ?Phone: [phone]  Fax: [phone] ? ?[place], [place] ?[place] ?[place] [address] ?Phone: [phone] Fax: [phone] ? ?[id number] [place] [place]?Phone: [phone]: [phone]? ?Readmission Risk Interventions ?No flowsheet data found. ? ? ?

## 2022-01-13 DIAGNOSIS — D649 Anemia, unspecified: Secondary | ICD-10-CM | POA: Diagnosis not present

## 2022-01-13 DIAGNOSIS — M272 Inflammatory conditions of jaws: Secondary | ICD-10-CM | POA: Diagnosis present

## 2022-01-13 DIAGNOSIS — L03211 Cellulitis of face: Secondary | ICD-10-CM | POA: Diagnosis not present

## 2022-01-13 DIAGNOSIS — N179 Acute kidney failure, unspecified: Secondary | ICD-10-CM | POA: Diagnosis not present

## 2022-01-13 LAB — POTASSIUM: Potassium: 3.3 mmol/L — ABNORMAL LOW (ref 3.5–5.1)

## 2022-01-13 MED ORDER — BACID PO TABS: 2.0000 | ORAL_TABLET | Freq: Three times a day (TID) | ORAL | Status: DC

## 2022-01-13 MED ORDER — RISAQUAD PO CAPS
2.0000 | ORAL_CAPSULE | Freq: Three times a day (TID) | ORAL | Status: DC
  Administered 2022-01-13 – 2022-01-16 (×10): 2 via ORAL
  Filled 2022-01-13 (×12): qty 2

## 2022-01-13 MED ORDER — TRAZODONE HCL 50 MG PO TABS
50.0000 mg | ORAL_TABLET | Freq: Every evening | ORAL | Status: DC | PRN
  Administered 2022-01-13 – 2022-01-15 (×2): 50 mg via ORAL
  Filled 2022-01-13 (×3): qty 1

## 2022-01-13 NOTE — Progress Notes (Signed)
PROGRESS NOTE    Karina [name]  DQQ:[id number] DOB: 05-16-1933 DOA: 01/09/2022 PCP: Burnard Bunting, MD    Brief Narrative:  Karina [name] is a 88 y.o. female with a history of anemia, hypothyroidism, SVT/sinus tachycardia, hypertension, anxiety. Patient presented secondary to falling this morning. She reports failing this morning after feeling lightheaded. No associated dyspnea, chest pain or palpitations. Patient unable to provide much more history and further history provided by niece. This morning, patient's sister called and paitent told her sister than she fell on the floor. Patient sister called the patient's son and called 67. Patient does report some left jaw/neck swelling that started three days prior to admission. Patient does report taking Tylenol for some pain. She reports some nausea without emesis.   3/2 feels swelling on face Lt side going down. Started on clears 3/3 tolerated clears.  Feels less pain and swelling of the face 3/4 doing better, tolerated full liquid diet 3/5 of pain of left mandibule.   Consultants:  ENT  Procedures:   Antimicrobials:  Unasyn   Subjective: Having diarrhea, 2-3 x this am per nsg. No abd pain/v  Objective: Vitals:   01/12/22 2337 01/13/22 0323 01/13/22 0832 01/13/22 1216  BP: (!) 144/79 (!) 157/62 (!) 162/72 (!) 158/73  Pulse: 72 65 78 84  Resp: _0 Temp: 97.9 F (36.6 C) 98 F (36.7 C) 98 F (36.7 C) 97.9 F (36.6 C)  TempSrc: Oral Oral Oral Oral  SpO2: 94% 97% 96% 96%  Weight:      Height:        Intake/Output Summary (Last 24 hours) at 01/13/2022 1439 Last data filed at 01/13/2022 0845 Gross per 24 hour  Intake 580 ml  Output --  Net 580 ml   Filed Weights   01/09/22 1326  Weight: 54.9 kg    Examination: Calm, NAD HEENT: mildly increase swelling of Lt mandibule, no fluctuance Cta no w/r Reg s1/s2 no gallop Soft benign +bs No edema Aaoxox3  Mood and affect appropriate in current setting       Data Reviewed: I have personally reviewed following labs and imaging studies  CBC: Recent Labs  Lab 01/09/22 1333 01/09/22 1441 01/10/22 0129 01/11/22 0200  WBC 17.6*  --  15.1* 12.5*  NEUTROABS 13.5*  --   --   --   HGB 11.1* 10.9* 9.9* 9.7*  HCT 34.8* 32.0* 30.4* 30.3*  MCV 89.9  --  87.9 88.6  PLT 266  --  206 194   Basic Metabolic Panel: Recent Labs  Lab 01/09/22 1333 01/09/22 1441 01/10/22 0129  NA 138 142 138  K 3.0* 3.2* 3.9  CL 103 105 106  CO2 23  --  22  GLUCOSE 190* 135* 183*  BUN 34* 30* 16  CREATININE 1.30* 1.10* 1.06*  CALCIUM 9.3  --  8.4*   GFR: Estimated Creatinine Clearance: 31.5 mL/min (A) (by C-G formula based on SCr of 1.06 mg/dL (H)). Liver Function Tests: Recent Labs  Lab 01/09/22 1333  AST 26  ALT 19  ALKPHOS 91  BILITOT 0.4  PROT 8.4*  ALBUMIN 3.6   No results for input(s): LIPASE, AMYLASE in the last 168 hours. No results for input(s): AMMONIA in the last 168 hours. Coagulation Profile: Recent Labs  Lab 01/09/22 1333  INR 1.1   Cardiac Enzymes: No results for input(s): CKTOTAL, CKMB, CKMBINDEX, TROPONINI in the last 168 hours. BNP (last 3 results) No results for input(s): PROBNP in the last  8760 hours. HbA1C: No results for input(s): HGBA1C in the last 72 hours. CBG: Recent Labs  Lab 01/09/22 1328  GLUCAP 200*   Lipid Profile: No results for input(s): CHOL, HDL, LDLCALC, TRIG, CHOLHDL, LDLDIRECT in the last 72 hours. Thyroid Function Tests: No results for input(s): TSH, T4TOTAL, FREET4, T3FREE, THYROIDAB in the last 72 hours. Anemia Panel: No results for input(s): VITAMINB12, FOLATE, FERRITIN, TIBC, IRON, RETICCTPCT in the last 72 hours. Sepsis Labs: Recent Labs  Lab 01/09/22 1333 01/09/22 1600  LATICACIDVEN 2.8* 1.7    Recent Results (from the past 240 hour(s))  Resp Panel by RT-PCR (Flu A&B, Covid)     Status: None   Collection Time: 01/09/22  1:33 PM   Specimen: Nasopharyngeal(NP) swabs in vial  transport medium  Result Value Ref Range Status   SARS Coronavirus 2 by RT PCR NEGATIVE NEGATIVE Final    Comment: (NOTE) SARS-CoV-2 target nucleic acids are NOT DETECTED.  The SARS-CoV-2 RNA is generally detectable in upper respiratory specimens during the acute phase of infection. The lowest concentration of SARS-CoV-2 viral copies this assay can detect is 138 copies/mL. A negative result does not preclude SARS-Cov-2 infection and should not be used as the sole basis for treatment or other patient management decisions. A negative result may occur with  improper specimen collection/handling, submission of specimen other than nasopharyngeal swab, presence of viral mutation(s) within the areas targeted by this assay, and inadequate number of viral copies(<138 copies/mL). A negative result must be combined with clinical observations, patient history, and epidemiological information. The expected result is Negative.  Fact Sheet for Patients:  EntrepreneurPulse.com.au  Fact Sheet for Healthcare Providers:  IncredibleEmployment.be  This test is no t yet approved or cleared by the [place] FDA and  has been authorized for detection and/or diagnosis of SARS-CoV-2 by FDA under an Emergency Use Authorization (EUA). This EUA will remain  in effect (meaning this test can be used) for the duration of the COVID-19 declaration under Section 564(b)(1) of the Act, 21 U.S.C.section 360bbb-3(b)(1), unless the authorization is terminated  or revoked sooner.       Influenza A by PCR NEGATIVE NEGATIVE Final   Influenza B by PCR NEGATIVE NEGATIVE Final    Comment: (NOTE) The Xpert Xpress SARS-CoV-2/FLU/RSV plus assay is intended as an aid in the diagnosis of influenza from Nasopharyngeal swab specimens and should not be used as a sole basis for treatment. Nasal washings and aspirates are unacceptable for Xpert Xpress SARS-CoV-2/FLU/RSV testing.  Fact  Sheet for Patients: EntrepreneurPulse.com.au  Fact Sheet for Healthcare Providers: IncredibleEmployment.be  This test is not yet approved or cleared by the [place] FDA and has been authorized for detection and/or diagnosis of SARS-CoV-2 by FDA under an Emergency Use Authorization (EUA). This EUA will remain in effect (meaning this test can be used) for the duration of the COVID-19 declaration under Section 564(b)(1) of the Act, 21 U.S.C. section 360bbb-3(b)(1), unless the authorization is terminated or revoked.  Performed at [facility], [place] [address], [place], [place] [address]   Blood Culture (routine x 2)     Status: None (Preliminary result)   Collection Time: 01/09/22  1:44 PM   Specimen: BLOOD  Result Value Ref Range Status   Specimen Description   Final    BLOOD RIGHT ANTECUBITAL Performed at [facility] [address], [place], [place] [address]    Special Requests   Final    BOTTLES DRAWN AEROBIC AND ANAEROBIC Blood Culture adequate  volume Performed at [facility], [place] [address] [place], [place] [address]    Culture   Final    NO GROWTH 4 DAYS Performed at [facility], [place] [address] [place], [place] [address]    Report Status PENDING  Incomplete  Blood Culture (routine x 2)     Status: None (Preliminary result)   Collection Time: 01/09/22  6:00 PM   Specimen: BLOOD  Result Value Ref Range Status   Specimen Description   Final    BLOOD LEFT ANTECUBITAL Performed at [facility] [address] [place], [place] [address]    Special Requests   Final    BOTTLES DRAWN AEROBIC AND ANAEROBIC Blood Culture adequate volume Performed at [facility] [address], [place], [place] [address]    Culture   Final    NO GROWTH 4 DAYS Performed at [facility], [place] [address] [place], [place] [address]    Report  Status PENDING  Incomplete  Urine Culture     Status: Abnormal   Collection Time: 01/09/22  6:00 PM   Specimen: In/Out Cath Urine  Result Value Ref Range Status   Specimen Description   Final    IN/OUT CATH URINE Performed at [facility] [address], [place], [place] [address]    Special Requests   Final    NONE Performed at [facility], [place] [address], [place], [place] [address]    Culture 5,000 COLONIES/mL STAPHYLOCOCCUS EPIDERMIDIS (A)  Final   Report Status 01/11/2022 FINAL  Final   Organism ID, Bacteria STAPHYLOCOCCUS EPIDERMIDIS (A)  Final      Susceptibility   Staphylococcus epidermidis - MIC*    CIPROFLOXACIN <=0.5 SENSITIVE Sensitive     GENTAMICIN <=0.5 SENSITIVE Sensitive     NITROFURANTOIN <=16 SENSITIVE Sensitive     OXACILLIN >=4 RESISTANT Resistant     TETRACYCLINE >=16 RESISTANT Resistant     VANCOMYCIN 1 SENSITIVE Sensitive     TRIMETH/SULFA <=10 SENSITIVE Sensitive     CLINDAMYCIN 2 INTERMEDIATE Intermediate     RIFAMPIN 2 INTERMEDIATE Intermediate     Inducible Clindamycin NEGATIVE Sensitive     * 5,000 COLONIES/mL STAPHYLOCOCCUS EPIDERMIDIS  Aerobic/Anaerobic Culture w Gram Stain (surgical/deep wound)     Status: None (Preliminary result)   Collection Time: 01/09/22  9:11 PM   Specimen: PATH Bone biopsy; Tissue  Result Value Ref Range Status   Specimen Description ABSCESS  Final   Special Requests PERIMANDIBULAR  Final   Gram Stain   Final    FEW WBC PRESENT,BOTH PMN AND MONONUCLEAR FEW GRAM POSITIVE COCCI IN CHAINS    Culture   Final    FEW ENTEROBACTER CLOACAE CULTURE REINCUBATED FOR BETTER GROWTH Performed at [facility], [place] [address], [place], [place] [address]    Report Status PENDING  Incomplete   Organism ID, Bacteria ENTEROBACTER CLOACAE  Final      Susceptibility   Enterobacter cloacae - MIC*    CEFAZOLIN >=64 RESISTANT Resistant     CEFEPIME <=0.12 SENSITIVE Sensitive     CEFTAZIDIME  <=1 SENSITIVE Sensitive     CIPROFLOXACIN <=0.25 SENSITIVE Sensitive     GENTAMICIN <=1 SENSITIVE Sensitive     IMIPENEM 0.5 SENSITIVE Sensitive     TRIMETH/SULFA <=20 SENSITIVE Sensitive     PIP/TAZO <=4 SENSITIVE Sensitive     * FEW ENTEROBACTER CLOACAE         Radiology Studies:  [place] EKG SITE RITE  Result Date: 01/12/2022 If Site Rite image not attached, placement could not be confirmed due to current cardiac rhythm.       Scheduled Meds:  acidophilus  2 capsule Oral TID   chlorhexidine  15 mL Mouth/Throat Q4H   Chlorhexidine Gluconate Cloth  6 each Topical Daily   levothyroxine  75 mcg Oral Q0600   metoprolol succinate  50 mg Oral BID   pantoprazole  40 mg Oral Daily   simvastatin  20 mg Oral QHS   sodium chloride flush  10-40 mL Intracatheter Q12H   Continuous Infusions:  ertapenem 1,000 mg (01/13/22 1224)    Assessment & Plan:   Principal Problem:   Osteomyelitis of mandible Active Problems:   Hyperlipidemia   Chronic anemia   HTN (hypertension)   Depression with anxiety   SVT (supraventricular tachycardia) (HCC)   Sinus tachycardia   Severe sepsis (HCC)   Abscess of jaw, left   Cellulitis of left jaw   Hypothyroidism   AKI (acute kidney injury) (HCC)   Nausea and vomiting   Hypokalemia   * Severe sepsis ([place])- (present on admission) Patient with leukocytosis, tachycardia and tachypnea. Urinalysis unremarkable. Blood and urine cultures obtained on admission. 3/5 due to left mandibular infection below   continue IV antibiotics below  WBC trended down      Cellulitis/abscess of left perimandibular ENT following, recommended transfer to [facility] for [facility]&D On Unasyn Postop day 1 status post I&D IV Decadron 8 mg 3 times daily for 3 total doses-completed Follow-up Intra-Op culture/biopsy Recommend Peridex swish and spit every 4 hours Apply warm compresses to the mandible with gentle massage Wound culture with few WBCs gram-positive cocci in  chains 3/5 tolerated diet Surgical pathology reveals osteomyelitis Spoke to ENT today recommends outpatient follow-up with oral surgeon ID following input was appreciated, see below Lt side more swollen today, will monitor , if continues to increase, will give dose of iv steroids, d/w ENT .    Lt mandibular osteomyelitis Surgical path + ID was consulted, input was appreciated-recommend switching IV antibiotics to IV ertapenem 1 g daily and plan on 6 weeks of IV therapy at home via home health Check baseline ESR, CRP Place PICC line 3/5 Will need to follow-up with oral surgeon as outpatient  Follow-up with ID on 01/29/2022 at 31 AM with Dr. Marius Ditch  Add probiotics since having diarrhea    AKI (acute kidney injury) ([place]) Improved with IV fluids    Hypothyroidism Continue Synthroid    Sinus tachycardia(present on admission) PSVT-  Patient with a history of sinus tachycardia in addition to SVT. Seen by cardiology as an outpatient. On metoprolol and diltiazem. Complicated by sepsis. 3/4 DC IV metoprolol Resume home meds metoprolol   Hypokalemia Was replaced Will check and monitor since having diarrhea   Depression with anxiety- (present on admission) Patient is on Xanax prn as an outpatient. Held on admission.   HTN (hypertension)- (present on admission) Patient is on diltiazem and metoprolol as an outpatient which are held while NPO -Metoprolol 2.5 mg IV q6 hours -Hydralazine 10 mg IV PRN   Chronic anemia- (present on admission) Baseline hemoglobin appears to be around 11. Currently stable.   Hyperlipidemia- (present on admission) Patient is on simvastatin as an outpatient. Simvastatin held on admission.     DVT prophylaxis: SCD Code Status: Full Family Communication: none at bedisde Disposition Plan:  Status is: Inpatient Remains inpatient appropriate because: IV treatment, needs teaching on picc/iv abx.  Having diarrhea, need to monitor. Face more swollen today  need to monitor                 LOS: 4 days   Time spent: 35 minutes with 50% on COC    Nolberto Hanlon, MD [facility] Pager [phone]  If 7PM-7AM, please contact night-coverage 01/13/2022, 2:39 PM

## 2022-01-13 NOTE — Progress Notes (Signed)
Subjective: Worsening left-sided jaw pain today   Antibiotics:  Anti-infectives (From admission, onward)    Start     Dose/Rate Route Frequency Ordered Stop   01/12/22 1415  ertapenem (INVANZ) 1,000 mg in sodium chloride 0.9 % 100 mL IVPB        1 g 200 mL/hr over 30 Minutes Intravenous Every 24 hours 01/12/22 1329     01/09/22 1845  Ampicillin-Sulbactam (UNASYN) 3 g in sodium chloride 0.9 % 100 mL IVPB  Status:  Discontinued        3 g 200 mL/hr over 30 Minutes Intravenous Every 6 hours 01/09/22 1835 01/12/22 1329   01/09/22 1700  vancomycin (VANCOCIN) IVPB 1000 mg/200 mL premix        1,000 mg 200 mL/hr over 60 Minutes Intravenous  Once 01/09/22 1654 01/09/22 1835   01/09/22 1345  cefTRIAXone (ROCEPHIN) 2 g in sodium chloride 0.9 % 100 mL IVPB        2 g 200 mL/hr over 30 Minutes Intravenous  Once 01/09/22 1334 01/09/22 1556       Medications: Scheduled Meds:  acidophilus  2 capsule Oral TID   chlorhexidine  15 mL Mouth/Throat Q4H   Chlorhexidine Gluconate Cloth  6 each Topical Daily   levothyroxine  75 mcg Oral Q0600   metoprolol succinate  50 mg Oral BID   pantoprazole  40 mg Oral Daily   simvastatin  20 mg Oral QHS   sodium chloride flush  10-40 mL Intracatheter Q12H   Continuous Infusions:  ertapenem 1,000 mg (01/13/22 1224)   PRN Meds:.acetaminophen **OR** acetaminophen, hydrALAZINE, morphine injection, ondansetron **OR** ondansetron (ZOFRAN) IV, sodium chloride flush    Objective: Weight change:   Intake/Output Summary (Last 24 hours) at 01/13/2022 1530 Last data filed at 01/13/2022 0845 Gross per 24 hour  Intake 580 ml  Output --  Net 580 ml   Blood pressure (!) 158/73, pulse 84, temperature 97.9 F (36.6 C), temperature source Oral, resp. rate 15, height '5\' 3"'$  (1.6 m), weight 54.9 kg, SpO2 96 %. Temp:  [97.6 F (36.4 C)-98 F (36.7 C)] 97.9 F (36.6 C) (03/05 1216) Pulse Rate:  [65-84] 84 (03/05 1216) Resp:  [15-20] 15 (03/05 1216) BP:  (135-174)/(62-103) 158/73 (03/05 1216) SpO2:  [92 %-97 %] 96 % (03/05 1216)  Physical Exam: Physical Exam Constitutional:      General: She is not in acute distress.    Appearance: She is well-developed. She is not diaphoretic.  HENT:     Head: Normocephalic and atraumatic.     Jaw: Tenderness present.     Right Ear: External ear normal.     Left Ear: External ear normal.     Mouth/Throat:     Pharynx: No oropharyngeal exudate.  Eyes:     General: No scleral icterus.    Conjunctiva/sclera: Conjunctivae normal.     Pupils: Pupils are equal, round, and reactive to light.  Cardiovascular:     Rate and Rhythm: Normal rate and regular rhythm.     Heart sounds: Normal heart sounds. No murmur heard.   No friction rub. No gallop.  Pulmonary:     Effort: Pulmonary effort is normal. No respiratory distress.     Breath sounds: Normal breath sounds. No wheezing or rales.  Abdominal:     General: Bowel sounds are normal. There is no distension.     Palpations: Abdomen is soft.     Tenderness: There is no abdominal tenderness. There  is no rebound.  Musculoskeletal:        General: No tenderness. Normal range of motion.  Lymphadenopathy:     Cervical: No cervical adenopathy.  Skin:    General: Skin is warm and dry.     Coloration: Skin is not pale.     Findings: No erythema or rash.  Neurological:     General: No focal deficit present.     Mental Status: She is alert and oriented to person, place, and time.     Motor: No abnormal muscle tone.     Coordination: Coordination normal.  Psychiatric:        Mood and Affect: Mood normal.        Behavior: Behavior normal.        Thought Content: Thought content normal.        Judgment: Judgment normal.     CBC:    BMET No results for input(s): NA, K, CL, CO2, GLUCOSE, BUN, CREATININE, CALCIUM in the last 72 hours.   Liver Panel  No results for input(s): PROT, ALBUMIN, AST, ALT, ALKPHOS, BILITOT, BILIDIR, IBILI in the last 72  hours.     Sedimentation Rate No results for input(s): ESRSEDRATE in the last 72 hours. C-Reactive Protein No results for input(s): CRP in the last 72 hours.  Micro Results: Recent Results (from the past 720 hour(s))  Resp Panel by RT-PCR (Flu A&B, Covid)     Status: None   Collection Time: 01/09/22  1:33 PM   Specimen: Nasopharyngeal(NP) swabs in vial transport medium  Result Value Ref Range Status   SARS Coronavirus 2 by RT PCR NEGATIVE NEGATIVE Final    Comment: (NOTE) SARS-CoV-2 target nucleic acids are NOT DETECTED.  The SARS-CoV-2 RNA is generally detectable in upper respiratory specimens during the acute phase of infection. The lowest concentration of SARS-CoV-2 viral copies this assay can detect is 138 copies/mL. A negative result does not preclude SARS-Cov-2 infection and should not be used as the sole basis for treatment or other patient management decisions. A negative result may occur with  improper specimen collection/handling, submission of specimen other than nasopharyngeal swab, presence of viral mutation(s) within the areas targeted by this assay, and inadequate number of viral copies(<138 copies/mL). A negative result must be combined with clinical observations, patient history, and epidemiological information. The expected result is Negative.  Fact Sheet for Patients:  EntrepreneurPulse.com.au  Fact Sheet for Healthcare Providers:  IncredibleEmployment.be  This test is no t yet approved or cleared by the [place] FDA and  has been authorized for detection and/or diagnosis of SARS-CoV-2 by FDA under an Emergency Use Authorization (EUA). This EUA will remain  in effect (meaning this test can be used) for the duration of the COVID-19 declaration under Section 564(b)(1) of the Act, 21 U.S.C.section 360bbb-3(b)(1), unless the authorization is terminated  or revoked sooner.       Influenza A by PCR NEGATIVE NEGATIVE  Final   Influenza B by PCR NEGATIVE NEGATIVE Final    Comment: (NOTE) The Xpert Xpress SARS-CoV-2/FLU/RSV plus assay is intended as an aid in the diagnosis of influenza from Nasopharyngeal swab specimens and should not be used as a sole basis for treatment. Nasal washings and aspirates are unacceptable for Xpert Xpress SARS-CoV-2/FLU/RSV testing.  Fact Sheet for Patients: EntrepreneurPulse.com.au  Fact Sheet for Healthcare Providers: IncredibleEmployment.be  This test is not yet approved or cleared by the [place] FDA and has been authorized for detection and/or diagnosis of SARS-CoV-2 by FDA under  an Emergency Use Authorization (EUA). This EUA will remain in effect (meaning this test can be used) for the duration of the COVID-19 declaration under Section 564(b)(1) of the Act, 21 U.S.C. section 360bbb-3(b)(1), unless the authorization is terminated or revoked.  Performed at [facility], [place] [address], [place], [place] [address]   Blood Culture (routine x 2)     Status: None (Preliminary result)   Collection Time: 01/09/22  1:44 PM   Specimen: BLOOD  Result Value Ref Range Status   Specimen Description   Final    BLOOD RIGHT ANTECUBITAL Performed at [facility] [address] [place], [place] [address]    Special Requests   Final    BOTTLES DRAWN AEROBIC AND ANAEROBIC Blood Culture adequate volume Performed at [facility] [address], [place], [place] [address]    Culture   Final    NO GROWTH 4 DAYS Performed at [facility], [place] [address], [place], [place] [address]    Report Status PENDING  Incomplete  Blood Culture (routine x 2)     Status: None (Preliminary result)   Collection Time: 01/09/22  6:00 PM   Specimen: BLOOD  Result Value Ref Range Status   Specimen Description   Final    BLOOD LEFT ANTECUBITAL Performed at [facility] [address], [place], [place] [address]    Special Requests   Final    BOTTLES DRAWN AEROBIC AND ANAEROBIC Blood Culture adequate volume Performed at [place] [address], [place], [place] [address]    Culture   Final    NO GROWTH 4 DAYS Performed at [facility], [place] [address], [place], [place] [address]    Report Status PENDING  Incomplete  Urine Culture     Status: Abnormal   Collection Time: 01/09/22  6:00 PM   Specimen: In/Out Cath Urine  Result Value Ref Range Status   Specimen Description   Final    IN/OUT CATH URINE Performed at [facility] [address] [place] [address], [place], [place] [address]    Special Requests   Final    NONE Performed at [facility], [place] [address] [place], [place] [address]    Culture 5,000 COLONIES/mL STAPHYLOCOCCUS EPIDERMIDIS (A)  Final   Report Status 01/11/2022 FINAL  Final   Organism ID, Bacteria STAPHYLOCOCCUS EPIDERMIDIS (A)  Final      Susceptibility   Staphylococcus epidermidis - MIC*    CIPROFLOXACIN <=0.5 SENSITIVE Sensitive     GENTAMICIN <=0.5 SENSITIVE Sensitive     NITROFURANTOIN <=16 SENSITIVE Sensitive     OXACILLIN >=4 RESISTANT Resistant     TETRACYCLINE >=16 RESISTANT Resistant     VANCOMYCIN 1 SENSITIVE Sensitive     TRIMETH/SULFA <=10 SENSITIVE Sensitive     CLINDAMYCIN 2 INTERMEDIATE Intermediate     RIFAMPIN 2 INTERMEDIATE Intermediate     Inducible Clindamycin NEGATIVE Sensitive     * 5,000 COLONIES/mL STAPHYLOCOCCUS EPIDERMIDIS  Aerobic/Anaerobic Culture w Gram Stain (surgical/deep wound)     Status: None (Preliminary result)   Collection Time: 01/09/22  9:11 PM   Specimen: PATH Bone biopsy; Tissue  Result Value Ref Range Status   Specimen Description ABSCESS  Final   Special Requests PERIMANDIBULAR  Final   Gram Stain   Final    FEW WBC PRESENT,BOTH PMN AND MONONUCLEAR FEW GRAM POSITIVE COCCI IN CHAINS    Culture   Final  FEW ENTEROBACTER  CLOACAE CULTURE REINCUBATED FOR BETTER GROWTH Performed at [facility], [place] [address], [place], [place] [address]    Report Status PENDING  Incomplete   Organism ID, Bacteria ENTEROBACTER CLOACAE  Final      Susceptibility   Enterobacter cloacae - MIC*    CEFAZOLIN >=64 RESISTANT Resistant     CEFEPIME <=0.12 SENSITIVE Sensitive     CEFTAZIDIME <=1 SENSITIVE Sensitive     CIPROFLOXACIN <=0.25 SENSITIVE Sensitive     GENTAMICIN <=1 SENSITIVE Sensitive     IMIPENEM 0.5 SENSITIVE Sensitive     TRIMETH/SULFA <=20 SENSITIVE Sensitive     PIP/TAZO <=4 SENSITIVE Sensitive     * FEW ENTEROBACTER CLOACAE    Studies/Results: [place] EKG SITE RITE  Result Date: 01/12/2022 If Site Rite image not attached, placement could not be confirmed due to current cardiac rhythm.     Assessment/Plan:  INTERVAL HISTORY: Patient with worsening jaw pain today.     Principal Problem:   Osteomyelitis of mandible Active Problems:   Hyperlipidemia   Chronic anemia   HTN (hypertension)   Depression with anxiety   SVT (supraventricular tachycardia) (HCC)   Sinus tachycardia   Severe sepsis (HCC)   Abscess of jaw, left   Cellulitis of left jaw   Hypothyroidism   AKI (acute kidney injury) (HCC)   Nausea and vomiting   Hypokalemia    Karina [name] is a 88 y.o. female with neck abscess and mandibular osteomyelitis status post I&D of abscess by ear nose and throat surgery.  Enterobacter cloacae has been isolated from culture though cultures are not yet finalized.  She seems to be improving but today she has worsening left-sided jaw pain.  #1 Neck abscess with mandibular osteomyelitis  We will plan on continuing Invanz to complete 6 weeks of therapy as mentioned in my note yesterday  I agree that her jaw pain could be due to tapering of her steroids but I also think 1 needs to consider repeat imaging of the jaw if this continues to worsen.  She has follow-up visit with me scheduled and I  have shown her where our eye clinic is.  We will follow-up on culture data but otherwise we will sign off for now please call with further questions and certainly if her jaw pain worsens despite continued antibiotics and if needed steroids per ENT      LOS: 4 days   Karina [name] 01/13/2022, 3:30 PM

## 2022-01-14 DIAGNOSIS — M272 Inflammatory conditions of jaws: Secondary | ICD-10-CM | POA: Diagnosis not present

## 2022-01-14 LAB — AEROBIC/ANAEROBIC CULTURE W GRAM STAIN (SURGICAL/DEEP WOUND)

## 2022-01-14 LAB — CBC
HCT: 31.1 % — ABNORMAL LOW (ref 36.0–46.0)
Hemoglobin: 10.1 g/dL — ABNORMAL LOW (ref 12.0–15.0)
MCH: 29.1 pg (ref 26.0–34.0)
MCHC: 32.5 g/dL (ref 30.0–36.0)
MCV: 89.6 fL (ref 80.0–100.0)
Platelets: 270 10*3/uL (ref 150–400)
RBC: 3.47 MIL/uL — ABNORMAL LOW (ref 3.87–5.11)
RDW: 13.2 % (ref 11.5–15.5)
WBC: 16.8 10*3/uL — ABNORMAL HIGH (ref 4.0–10.5)
nRBC: 0.2 % (ref 0.0–0.2)

## 2022-01-14 LAB — CULTURE, BLOOD (ROUTINE X 2)
Culture: NO GROWTH
Culture: NO GROWTH
Special Requests: ADEQUATE
Special Requests: ADEQUATE

## 2022-01-14 LAB — BASIC METABOLIC PANEL
Anion gap: 7 (ref 5–15)
BUN: 20 mg/dL (ref 8–23)
CO2: 30 mmol/L (ref 22–32)
Calcium: 8.8 mg/dL — ABNORMAL LOW (ref 8.9–10.3)
Chloride: 101 mmol/L (ref 98–111)
Creatinine, Ser: 1.11 mg/dL — ABNORMAL HIGH (ref 0.44–1.00)
GFR, Estimated: 48 mL/min — ABNORMAL LOW (ref >60)
Glucose, Bld: 106 mg/dL — ABNORMAL HIGH (ref 70–99)
Potassium: 3.9 mmol/L (ref 3.5–5.1)
Sodium: 138 mmol/L (ref 135–145)

## 2022-01-14 MED ORDER — METOPROLOL SUCCINATE ER 25 MG PO TB24
25.0000 mg | ORAL_TABLET | Freq: Two times a day (BID) | ORAL | Status: DC
Start: 2022-01-14 — End: 2022-01-16
  Administered 2022-01-14 – 2022-01-16 (×4): 25 mg via ORAL
  Filled 2022-01-14 (×4): qty 1

## 2022-01-14 MED ORDER — LACTATED RINGERS IV SOLN: INTRAVENOUS | Status: DC

## 2022-01-14 MED ORDER — ENOXAPARIN SODIUM 30 MG/0.3ML IJ SOSY
30.0000 mg | PREFILLED_SYRINGE | Freq: Every evening | INTRAMUSCULAR | Status: DC
  Administered 2022-01-14 – 2022-01-15 (×2): 30 mg via SUBCUTANEOUS
  Filled 2022-01-14 (×2): qty 0.3

## 2022-01-14 NOTE — Progress Notes (Signed)
[facility] Infectious Diseases Follow Up Note  Patient Identification: Patient Name: Karina [name] MRN: [id number] [place] Date: 01/09/2022  1:09 PM Age: 88 y.o.Today's Date: 01/14/2022  Reason for Visit: Mandibular osteomyelitis   Principal Problem:   Osteomyelitis of mandible Active Problems:   Hyperlipidemia   Chronic anemia   HTN (hypertension)   Depression with anxiety   SVT (supraventricular tachycardia) (HCC)   Sinus tachycardia   Severe sepsis (HCC)   Abscess of jaw, left   Cellulitis of left jaw   Hypothyroidism   AKI (acute kidney injury) (HCC)   Nausea and vomiting   Hypokalemia   Antibiotics:  Vancomycin 3/1 Ceftriaxone 3/1 Ampsulbactam 3/1- c   Lines/Hardwares: RT arm PICC  Interval Events: continues to be afebrile, WBC up to 16.8   Assessment Left perimandibular abscess and osteomyelitis  - s/p I and D on 3/1. Cultures growing Enterobacter cloacae and actinomyces naeslundii. Path  - Has PICC  - On IV ertapenem   2. Urine cultures + for 5000 colonies MRSE - Likely colonization, no symptoms and UA unremarkable   3. Leukocytosis - in the setting of steroid use, no new concerns   Recommendations Continue IV ertapenem as is to cover Enterobacter cloacae as well as actinomyces naeslundii Plan for 6 weeks Monitor CBC and BMP OPAT orders placed below Fu OR cultures to completion. Otherwise ID will sign off  Diagnosis: Mandibular osteomyelitis   Culture Result: Enterobacter cloacae and Actinomyces naeslundii  No Known Allergies  OPAT Orders Discharge antibiotics to be given via PICC line Discharge antibiotics: Ertapenem 1g iv daily Per pharmacy protocol Aim for Vancomycin trough 15-20 or AUC 400-550 (unless otherwise indicated) Duration: 6 weeks   End Date: 02/20/22  [facility] Care Per Protocol:  Home health RN for IV administration and teaching; PICC line care and labs.    Labs weekly while on IV  antibiotics: X__ CBC with differential X__ BMP __ CMP X__ CRP and ESR every 2 weeks  __ Vancomycin trough __ CK  X__ Please pull PIC at completion of IV antibiotics __ Please leave PIC in place until doctor has seen patient or been notified  Fax weekly labs to [phone]  Clinic Follow Up Appt: 3/21 at 11 pm   Rest of the management as per the primary team. Thank you for the consult. Please page with pertinent questions or concerns.  ______________________________________________________________________ Subjective patient seen and examined at the bedside. Sister at bedside Some soreness in the left jaw, stable   Vitals BP (!) 139/55    Pulse 98    Temp 97.6 F (36.4 C) (Oral)    Resp 19    Ht 5' 3"  (1.6 m)    Wt 54.9 kg    SpO2 100%    BMI 21.43 kg/m     Physical Exam Constitutional:  lying in the bed and sleeping     Comments: mild tenderness at the left angle of jaw, no fluctuance   Cardiovascular:     Rate and Rhythm: Normal rate and regular rhythm.     Heart sounds:   Pulmonary:     Effort: Pulmonary effort is normal.     Comments: clear lung sounds   Abdominal:     Palpations: Abdomen is soft.     Tenderness: non tender and non distended   Musculoskeletal:        General: No swelling or tenderness.   Skin:    Comments:   Neurological:     General: Grossly non  focal, awake, alert and oriented   Psychiatric:        Mood and Affect: Mood normal.     Pertinent Microbiology Results for orders placed or performed during the hospital encounter of 01/09/22  Resp Panel by RT-PCR (Flu A&B, Covid)     Status: None   Collection Time: 01/09/22  1:33 PM   Specimen: Nasopharyngeal(NP) swabs in vial transport medium  Result Value Ref Range Status   SARS Coronavirus 2 by RT PCR NEGATIVE NEGATIVE Final    Comment: (NOTE) SARS-CoV-2 target nucleic acids are NOT DETECTED.  The SARS-CoV-2 RNA is generally detectable in upper respiratory specimens during the  acute phase of infection. The lowest concentration of SARS-CoV-2 viral copies this assay can detect is 138 copies/mL. A negative result does not preclude SARS-Cov-2 infection and should not be used as the sole basis for treatment or other patient management decisions. A negative result may occur with  improper specimen collection/handling, submission of specimen other than nasopharyngeal swab, presence of viral mutation(s) within the areas targeted by this assay, and inadequate number of viral copies(<138 copies/mL). A negative result must be combined with clinical observations, patient history, and epidemiological information. The expected result is Negative.  Fact Sheet for Patients:  EntrepreneurPulse.com.au  Fact Sheet for Healthcare Providers:  IncredibleEmployment.be  This test is no t yet approved or cleared by the [place] FDA and  has been authorized for detection and/or diagnosis of SARS-CoV-2 by FDA under an Emergency Use Authorization (EUA). This EUA will remain  in effect (meaning this test can be used) for the duration of the COVID-19 declaration under Section 564(b)(1) of the Act, 21 U.S.C.section 360bbb-3(b)(1), unless the authorization is terminated  or revoked sooner.       Influenza A by PCR NEGATIVE NEGATIVE Final   Influenza B by PCR NEGATIVE NEGATIVE Final    Comment: (NOTE) The Xpert Xpress SARS-CoV-2/FLU/RSV plus assay is intended as an aid in the diagnosis of influenza from Nasopharyngeal swab specimens and should not be used as a sole basis for treatment. Nasal washings and aspirates are unacceptable for Xpert Xpress SARS-CoV-2/FLU/RSV testing.  Fact Sheet for Patients: EntrepreneurPulse.com.au  Fact Sheet for Healthcare Providers: IncredibleEmployment.be  This test is not yet approved or cleared by the [place] FDA and has been authorized for detection and/or  diagnosis of SARS-CoV-2 by FDA under an Emergency Use Authorization (EUA). This EUA will remain in effect (meaning this test can be used) for the duration of the COVID-19 declaration under Section 564(b)(1) of the Act, 21 U.S.C. section 360bbb-3(b)(1), unless the authorization is terminated or revoked.  Performed at [facility], [place] [address] [place], [place] [address]   Blood Culture (routine x 2)     Status: None   Collection Time: 01/09/22  1:44 PM   Specimen: BLOOD  Result Value Ref Range Status   Specimen Description   Final    BLOOD RIGHT ANTECUBITAL Performed at [place] [address] [place], [place] [address]    Special Requests   Final    BOTTLES DRAWN AEROBIC AND ANAEROBIC Blood Culture adequate volume Performed at [place] [address] [place], [place] [address]    Culture   Final    NO GROWTH 5 DAYS Performed at [facility], [place] [address], [place], [place] [address]    Report Status 01/14/2022 FINAL  Final  Blood Culture (routine x 2)     Status: None  Collection Time: 01/09/22  6:00 PM   Specimen: BLOOD  Result Value Ref Range Status   Specimen Description   Final    BLOOD LEFT ANTECUBITAL Performed at [place] [address], [place], [place] [address]    Special Requests   Final    BOTTLES DRAWN AEROBIC AND ANAEROBIC Blood Culture adequate volume Performed at [place] [address], [place], [place] [address]    Culture   Final    NO GROWTH 5 DAYS Performed at [facility], [place] [address], [place], [place] [address]    Report Status 01/14/2022 FINAL  Final  Urine Culture     Status: Abnormal   Collection Time: 01/09/22  6:00 PM   Specimen: In/Out Cath Urine  Result Value Ref Range Status   Specimen Description   Final    IN/OUT CATH URINE Performed at [place] [address]  [place], [place] [address]    Special Requests   Final    NONE Performed at [facility], [place] [address], [place], [place] [address]    Culture 5,000 COLONIES/mL STAPHYLOCOCCUS EPIDERMIDIS (A)  Final   Report Status 01/11/2022 FINAL  Final   Organism ID, Bacteria STAPHYLOCOCCUS EPIDERMIDIS (A)  Final      Susceptibility   Staphylococcus epidermidis - MIC*    CIPROFLOXACIN <=0.5 SENSITIVE Sensitive     GENTAMICIN <=0.5 SENSITIVE Sensitive     NITROFURANTOIN <=16 SENSITIVE Sensitive     OXACILLIN >=4 RESISTANT Resistant     TETRACYCLINE >=16 RESISTANT Resistant     VANCOMYCIN 1 SENSITIVE Sensitive     TRIMETH/SULFA <=10 SENSITIVE Sensitive     CLINDAMYCIN 2 INTERMEDIATE Intermediate     RIFAMPIN 2 INTERMEDIATE Intermediate     Inducible Clindamycin NEGATIVE Sensitive     * 5,000 COLONIES/mL STAPHYLOCOCCUS EPIDERMIDIS  Aerobic/Anaerobic Culture w Gram Stain (surgical/deep wound)     Status: None (Preliminary result)   Collection Time: 01/09/22  9:11 PM   Specimen: PATH Bone biopsy; Tissue  Result Value Ref Range Status   Specimen Description ABSCESS  Final   Special Requests PERIMANDIBULAR  Final   Gram Stain   Final    FEW WBC PRESENT,BOTH PMN AND MONONUCLEAR FEW GRAM POSITIVE COCCI IN CHAINS    Culture   Final    FEW ENTEROBACTER CLOACAE FEW ACTINOMYCES NAESLUNDII Standardized susceptibility testing for this organism is not available. Performed at [facility], [place] [address] [place], [place] [address]    Report Status PENDING  Incomplete   Organism ID, Bacteria ENTEROBACTER CLOACAE  Final      Susceptibility   Enterobacter cloacae - MIC*    CEFAZOLIN >=64 RESISTANT Resistant     CEFEPIME <=0.12 SENSITIVE Sensitive     CEFTAZIDIME <=1 SENSITIVE Sensitive     CIPROFLOXACIN <=0.25 SENSITIVE Sensitive     GENTAMICIN <=1 SENSITIVE Sensitive     IMIPENEM 0.5 SENSITIVE Sensitive     TRIMETH/SULFA <=20 SENSITIVE Sensitive     PIP/TAZO <=4 SENSITIVE  Sensitive     * FEW ENTEROBACTER CLOACAE    Pertinent Pathology  FINAL MICROSCOPIC DIAGNOSIS:   A. SOFT TISSUE, LEFT PERIMANDIBULAR, BIOPSY:  - Skin and underlying soft tissue with acute and chronic inflammation,  consistent with abscess  - Scant fragments of bone with granulation tissue, consistent with  osteomyelitis   Pertinent Lab. CBC Latest Ref Rng & Units 01/14/2022 01/11/2022 01/10/2022  WBC 4.0 - 10.5 K/uL 16.8(H) 12.5(H)  15.1(H)  Hemoglobin 12.0 - 15.0 g/dL 10.1(L) 9.7(L) 9.9(L)  Hematocrit 36.0 - 46.0 % 31.1(L) 30.3(L) 30.4(L)  Platelets 150 - 400 K/uL 270 223 206   CMP Latest Ref Rng & Units 01/14/2022 01/13/2022 01/10/2022  Glucose 70 - 99 mg/dL 106(H) - 183(H)  BUN 8 - 23 mg/dL 20 - 16  Creatinine 0.44 - 1.00 mg/dL 1.11(H) - 1.06(H)  Sodium 135 - 145 mmol/L 138 - 138  Potassium 3.5 - 5.1 mmol/L 3.9 3.3(L) 3.9  Chloride 98 - 111 mmol/L 101 - 106  CO2 22 - 32 mmol/L 30 - 22  Calcium 8.9 - 10.3 mg/dL 8.8(L) - 8.4(L)  Total Protein 6.5 - 8.1 g/dL - - -  Total Bilirubin 0.3 - 1.2 mg/dL - - -  Alkaline Phos 38 - 126 U/L - - -  AST 15 - 41 U/L - - -  ALT 0 - 44 U/L - - -     Pertinent Imaging today Plain films and CT images have been personally visualized and interpreted; radiology reports have been reviewed. Decision making incorporated into the Impression / Recommendations.  CT Soft Tissue Neck W Contrast  Result Date: 01/09/2022 CLINICAL DATA:  Soft tissue swelling, infection suspected, neck xray done. Generalized weakness. Left jaw infection with redness and swelling extending into the neck. EXAM: CT NECK WITH CONTRAST TECHNIQUE: Multidetector CT imaging of the neck was performed using the standard protocol following the bolus administration of intravenous contrast. RADIATION DOSE REDUCTION: This exam was performed according to the departmental dose-optimization program which includes automated exposure control, adjustment of the mA and/or kV according to patient size and/or  use of iterative reconstruction technique. CONTRAST:  26m OMNIPAQUE IOHEXOL 300 MG/ML  SOLN COMPARISON:  None. FINDINGS: Pharynx and larynx: No mass. Patent airway. No retropharyngeal fluid collection. Salivary glands: Inflammatory changes are present in the left masticator, buccal, and submandibular spaces as well as within the overlying subcutaneous soft tissues of the face and upper neck. A low-density collection at the angle of the mandible on the left measures approximately 3.6 x 3.0 x 4.0 cm, extends inferiorly into the posterior aspect of the submandibular space, and extends superiorly in the masticator space with involvement of the masticator and pterygoid musculature. The mandible appears intact. The collection and regional inflammatory changes result in inferior displacement of the left submandibular gland which is mildly enlarged compared to the right but is more likely to be secondarily involved rather than reflecting the primary source of this inflammation. The right submandibular gland and both parotid glands are unremarkable. Thyroid: Diffusely small and otherwise grossly unremarkable within limitations of motion artifact. Lymph nodes: Borderline enlarged left level IIa lymph node measuring 1 cm in short axis, likely reactive. Vascular: Major vascular structures of the neck are grossly patent. Mild carotid atherosclerosis. Limited intracranial: Unremarkable. Visualized orbits: Unremarkable. Mastoids and visualized paranasal sinuses: Clear. Skeleton: Edentulous aside from an unerupted/possibly supernumerary tooth in the left maxillary incisor region. Upper thoracic levoscoliosis and cervical dextroscoliosis. Upper chest: Biapical pleuroparenchymal lung scarring. Other: None. IMPRESSION: 4 cm collection at the angle of the mandible on the left consistent with abscess with extensive surrounding inflammation. Electronically Signed   By: ALogan BoresM.D.   On: 01/09/2022 15:51   DG Chest Port 1  View  Result Date: 01/09/2022 CLINICAL DATA:  Sepsis. EXAM: PORTABLE CHEST 1 VIEW COMPARISON:  May 24, 2016. FINDINGS: Stable cardiomegaly. Both lungs are clear. The visualized skeletal structures are unremarkable. IMPRESSION: No active disease. Electronically Signed  By: Marijo Conception M.D.   On: 01/09/2022 15:43   [place] EKG SITE RITE  Result Date: 01/12/2022 If Site Rite image not attached, placement could not be confirmed due to current cardiac rhythm.    I spent more than 35 minutes for this patient encounter including review of prior medical records, coordination of care with primary/other specialist with greater than 50% of time being face to face/counseling and discussing diagnostics/treatment plan with the patient/family.  Electronically signed by:   Rosiland Oz, MD Infectious Disease Physician [facility] for Infectious Disease Pager: [phone]

## 2022-01-14 NOTE — Progress Notes (Signed)
Physical Therapy Treatment ?Patient Details ?Name: Karina [name] ?MRN: [id number] ?DOB: 1933/09/02 ?Today's Date: 01/14/2022 ? ? ?History of Present Illness The pt is an 88 yo female presenting 3/1 with generalized weakness and infection in L jaw (red, swollen). S/p draining of L perimandibular abscess on 3/1, undergoing continued work up for sepsis. Family also reports a fall morning of 3/1. PMH includes: anemia, hypothyroidism, SVT, HTN, anxiety. ? ?  ?PT Comments  ? ? Pt received in supine, agreeable to therapy session and with good participation and tolerance for gait, stair and transfer training. Pt steadier using RW for mobility this session, RW delivered to her room during session and adjusted by this therapist for proper height/fit, pt moderately reliant on RW for balance with gait. Pt performed gait in hallway and stair negotiation with up to min guard for safety, no LOB. BP and SpO2 WFL with positional changes/exertion, see comments below. Pt given gait belt for home for safety with mobility, she reports family will stay with her for safety initially. Pt continues to benefit from PT services to progress toward functional mobility goals.   ?Recommendations for follow up therapy are one component of a multi-disciplinary discharge planning process, led by the attending physician.  Recommendations may be updated based on patient status, additional functional criteria and insurance authorization. ? ?Follow Up Recommendations ? Home health PT ?  ?  ?Assistance Recommended at Discharge Frequent or constant Supervision/Assistance  ?Patient can return home with the following A little help with walking and/or transfers;Assistance with cooking/housework;Assist for transportation;Help with stairs or ramp for entrance ?  ?Equipment Recommendations ? Rolling walker (2 wheels)  ?  ?Recommendations for Other Services   ? ? ?  ?Precautions / Restrictions Precautions ?Precautions: Fall ?Precaution Comments: pt with hx of 3  recent falls ?Restrictions ?Weight Bearing Restrictions: No  ?  ? ?Mobility ? Bed Mobility ?Overal bed mobility: Needs Assistance ?Bed Mobility: Supine to Sit, Sit to Supine ?  ?  ?Supine to sit: Supervision, HOB elevated ?Sit to supine: Supervision ?  ?General bed mobility comments: cues for positioning higher in bed prior to return to supine, no physical assist needed; increased time ?  ? ?Transfers ?Overall transfer level: Needs assistance ?Equipment used: None ?Transfers: Sit to/from Stand ?Sit to Stand: Supervision ?  ?  ?  ?  ?  ?General transfer comment: Able to come to stand without LOB, supervision for safety; using RW upon standing ?  ? ?Ambulation/Gait ?Ambulation/Gait assistance: Min guard, Supervision ?Gait Distance (Feet): 90 Feet ?Assistive device: Rolling walker (2 wheels) ?Gait Pattern/deviations: Step-through pattern, Narrow base of support, Decreased stride length ?Gait velocity: decreased ?  ?  ?General Gait Details: supervision for forward stepping, min guard for safety when turning; no overt LOB, pt noted to be relying moderately on UE support (RW making loud sound) ? ? ?Stairs ?Stairs: Yes ?Stairs assistance: Min guard ?Stair Management: One rail Left, Forwards, Step to pattern ?Number of Stairs: 2 ?General stair comments: cues for leading with stronger leg and down with weaker leg, pt able to use each leg to step up however and no buckling or LOB; discussed curb ascent with family to pick up her RW and place up on curb before she steps up. ? ? ?Wheelchair Mobility ?  ? ?Modified Rankin (Stroke Patients Only) ?  ? ? ?  ?Balance Overall balance assessment: Needs assistance, History of Falls ?Sitting-balance support: No upper extremity supported, Feet supported ?Sitting balance-Leahy Scale: Good ?  ?  ?Standing  balance support: No upper extremity supported, During functional activity ?Standing balance-Leahy Scale: Fair ?Standing balance comment: moderate reliance on RW support for gait  progression ?  ?  ?  ?  ?  ?  ?  ?  ?  ?  ?  ?  ? ?  ?Cognition Arousal/Alertness: Awake/alert ?Behavior During Therapy: [facility] for tasks assessed/performed ?Overall Cognitive Status: Within Functional Limits for tasks assessed ?Area of Impairment: Safety/judgement ?  ?  ?  ?  ?  ?  ?  ?  ?  ?  ?  ?  ?Safety/Judgement: Decreased awareness of safety ?  ?  ?General Comments: pt with slightly reduced insight to safety and need for assistance, cooperative, discussed guarding/safety with family and pt present ?  ?  ? ?  ?Exercises Other Exercises ?Other Exercises: seated BLE AROM: hip flexion, LAQ x10 reps ea ? ?  ?General Comments General comments (skin integrity, edema, etc.): HR 110-121 bpm with exertion; SpO2 94-95% on RA; BP 120/76 seated EOB, BP 106/66 (79) standing at bedside, BP 101/81 (89) standing after ~8 mins ambulation, no dizziness reported; 5/10 modified RPE (Fatigue) ?  ?  ? ?Pertinent Vitals/Pain Pain Assessment ?Pain Assessment: 0-10 ?Pain Score: 2  ?Pain Location: jaw ?Pain Intervention(s): Monitored during session  ? ? ? ?PT Goals (current goals can now be found in the care plan section) Acute Rehab PT Goals ?Patient Stated Goal: to go home ?PT Goal Formulation: With patient/family ?Time For Goal Achievement: 01/25/22 ?Progress towards PT goals: Progressing toward goals ? ?  ?Frequency ? ? ? Min 3X/week ? ? ? ?  ?PT Plan Current plan remains appropriate  ? ? ?   ?AM-PAC PT "6 Clicks" Mobility   ?Outcome Measure ? Help needed turning from your back to your side while in a flat bed without using bedrails?: None ?Help needed moving from lying on your back to sitting on the side of a flat bed without using bedrails?: A Little ?Help needed moving to and from a bed to a chair (including a wheelchair)?: A Little ?Help needed standing up from a chair using your arms (e.g., wheelchair or bedside chair)?: A Little ?Help needed to walk in hospital room?: A Little ?Help needed climbing 3-5 steps with a railing? : A  Little ?6 Click Score: 19 ? ?  ?End of Session Equipment Utilized During Treatment: Gait belt ?Activity Tolerance: Patient tolerated treatment well ?Patient left: with call bell/phone within reach;with family/visitor present;in bed (bed in chair posture; x3 family members present) ?Nurse Communication: Mobility status ?PT Visit Diagnosis: Other abnormalities of gait and mobility (R26.89);Repeated falls (R29.6);Unsteadiness on feet (R26.81) ?  ? ? ?Time: 6301-6010 ?PT Time Calculation (min) (ACUTE ONLY): 26 min ? ?Charges:  $Gait Training: 23-37 mins          ?          ? ?Jannie Doyle P., PTA ?Acute Rehabilitation Services ?Pager: [phone] ?Office: [phone]  ? ? ?Kara Pacer Areana Kosanke [place] ?01/14/2022, 11:07 AM ? ?

## 2022-01-14 NOTE — Progress Notes (Signed)
PROGRESS NOTE    Karina [name]  WBE:[id number] DOB: 01-14-1933 DOA: 01/09/2022 PCP: Burnard Bunting, MD    Brief Narrative:  Karina [name] is a 88 y.o. female with a history of anemia, hypothyroidism, SVT/sinus tachycardia, hypertension, anxiety. Patient presented secondary to falling this morning. She reports failing this morning after feeling lightheaded. No associated dyspnea, chest pain or palpitations. Patient unable to provide much more history and further history provided by niece. This morning, patient's sister called and paitent told her sister than she fell on the floor. Patient sister called the patient's son and called 29. Patient does report some left jaw/neck swelling that started three days prior to admission. Patient does report taking Tylenol for some pain. She reports some nausea without emesis.   3/2 feels swelling on face Lt side going down. Started on clears 3/3 tolerated clears.  Feels less pain and swelling of the face 3/4 doing better, tolerated full liquid diet 3/5 of pain of left mandibule.  3/6 overnight with lots of Lt face pain, had to get iv morphine. This am took tylenol but still with some pain. Trying to avoid iv pain meds. Bp on low side, mildly tachy. Diarrhea stopped per nsg  Consultants:  ENT, ID  Procedures:   Antimicrobials:  invanz   Subjective:  No new complaints. Denies cp or sob  Objective: Vitals:   01/13/22 2305 01/14/22 0610 01/14/22 0744 01/14/22 0953  BP: (!) 112/56 129/85 (!) 148/68 (!) 139/55  Pulse:   79 98  Resp: _0 Temp: 98.5 F (36.9 C) 98.8 F (37.1 C) 97.6 F (36.4 C)   TempSrc: Oral Oral Oral   SpO2: 98% 99% 100%   Weight:      Height:        Intake/Output Summary (Last 24 hours) at 01/14/2022 1448 Last data filed at 01/14/2022 1005 Gross per 24 hour  Intake 580 ml  Output 250 ml  Net 330 ml   Filed Weights   01/09/22 1326  Weight: 54.9 kg    Examination: Calm, NAD Lt mandible mildly  swollen Cta no w/r Reg s1/s2 no gallop Soft benign +bs No edema Aaoxox3  Mood and affect appropriate in current setting      Data Reviewed: I have personally reviewed following labs and imaging studies  CBC: Recent Labs  Lab 01/09/22 1333 01/09/22 1441 01/10/22 0129 01/11/22 0200 01/14/22 1138  WBC 17.6*  --  15.1* 12.5* 16.8*  NEUTROABS 13.5*  --   --   --   --   HGB 11.1* 10.9* 9.9* 9.7* 10.1*  HCT 34.8* 32.0* 30.4* 30.3* 31.1*  MCV 89.9  --  87.9 88.6 89.6  PLT 266  --  206 223 415   Basic Metabolic Panel: Recent Labs  Lab 01/09/22 1333 01/09/22 1441 01/10/22 0129 01/13/22 1543 01/14/22 0615  NA 138 142 138  --  138  K 3.0* 3.2* 3.9 3.3* 3.9  CL 103 105 106  --  101  CO2 23  --  22  --  30  GLUCOSE 190* 135* 183*  --  106*  BUN 34* 30* 16  --  20  CREATININE 1.30* 1.10* 1.06*  --  1.11*  CALCIUM 9.3  --  8.4*  --  8.8*   GFR: Estimated Creatinine Clearance: 30.1 mL/min (A) (by C-G formula based on SCr of 1.11 mg/dL (H)). Liver Function Tests: Recent Labs  Lab 01/09/22 1333  AST 26  ALT 19  ALKPHOS 91  BILITOT 0.4  PROT 8.4*  ALBUMIN 3.6   No results for input(s): LIPASE, AMYLASE in the last 168 hours. No results for input(s): AMMONIA in the last 168 hours. Coagulation Profile: Recent Labs  Lab 01/09/22 1333  INR 1.1   Cardiac Enzymes: No results for input(s): CKTOTAL, CKMB, CKMBINDEX, TROPONINI in the last 168 hours. BNP (last 3 results) No results for input(s): PROBNP in the last 8760 hours. HbA1C: No results for input(s): HGBA1C in the last 72 hours. CBG: Recent Labs  Lab 01/09/22 1328  GLUCAP 200*   Lipid Profile: No results for input(s): CHOL, HDL, LDLCALC, TRIG, CHOLHDL, LDLDIRECT in the last 72 hours. Thyroid Function Tests: No results for input(s): TSH, T4TOTAL, FREET4, T3FREE, THYROIDAB in the last 72 hours. Anemia Panel: No results for input(s): VITAMINB12, FOLATE, FERRITIN, TIBC, IRON, RETICCTPCT in the last 72  hours. Sepsis Labs: Recent Labs  Lab 01/09/22 1333 01/09/22 1600  LATICACIDVEN 2.8* 1.7    Recent Results (from the past 240 hour(s))  Resp Panel by RT-PCR (Flu A&B, Covid)     Status: None   Collection Time: 01/09/22  1:33 PM   Specimen: Nasopharyngeal(NP) swabs in vial transport medium  Result Value Ref Range Status   SARS Coronavirus 2 by RT PCR NEGATIVE NEGATIVE Final    Comment: (NOTE) SARS-CoV-2 target nucleic acids are NOT DETECTED.  The SARS-CoV-2 RNA is generally detectable in upper respiratory specimens during the acute phase of infection. The lowest concentration of SARS-CoV-2 viral copies this assay can detect is 138 copies/mL. A negative result does not preclude SARS-Cov-2 infection and should not be used as the sole basis for treatment or other patient management decisions. A negative result may occur with  improper specimen collection/handling, submission of specimen other than nasopharyngeal swab, presence of viral mutation(s) within the areas targeted by this assay, and inadequate number of viral copies(<138 copies/mL). A negative result must be combined with clinical observations, patient history, and epidemiological information. The expected result is Negative.  Fact Sheet for Patients:  EntrepreneurPulse.com.au  Fact Sheet for Healthcare Providers:  IncredibleEmployment.be  This test is no t yet approved or cleared by the [place] FDA and  has been authorized for detection and/or diagnosis of SARS-CoV-2 by FDA under an Emergency Use Authorization (EUA). This EUA will remain  in effect (meaning this test can be used) for the duration of the COVID-19 declaration under Section 564(b)(1) of the Act, 21 U.S.C.section 360bbb-3(b)(1), unless the authorization is terminated  or revoked sooner.       Influenza A by PCR NEGATIVE NEGATIVE Final   Influenza B by PCR NEGATIVE NEGATIVE Final    Comment: (NOTE) The Xpert  Xpress SARS-CoV-2/FLU/RSV plus assay is intended as an aid in the diagnosis of influenza from Nasopharyngeal swab specimens and should not be used as a sole basis for treatment. Nasal washings and aspirates are unacceptable for Xpert Xpress SARS-CoV-2/FLU/RSV testing.  Fact Sheet for Patients: EntrepreneurPulse.com.au  Fact Sheet for Healthcare Providers: IncredibleEmployment.be  This test is not yet approved or cleared by the [place] FDA and has been authorized for detection and/or diagnosis of SARS-CoV-2 by FDA under an Emergency Use Authorization (EUA). This EUA will remain in effect (meaning this test can be used) for the duration of the COVID-19 declaration under Section 564(b)(1) of the Act, 21 U.S.C. section 360bbb-3(b)(1), unless the authorization is terminated or revoked.  Performed at [facility], [place] [address] [place],  [address]   Blood Culture (routine x 2)  Status: None   Collection Time: 01/09/22  1:44 PM   Specimen: BLOOD  Result Value Ref Range Status   Specimen Description   Final    BLOOD RIGHT ANTECUBITAL Performed at [facility] [address], [place], [place] [address]    Special Requests   Final    BOTTLES DRAWN AEROBIC AND ANAEROBIC Blood Culture adequate volume Performed at [facility] [address], [place], [place] [address]    Culture   Final    NO GROWTH 5 DAYS Performed at [facility], [place] [address], [place], [place] [address]    Report Status 01/14/2022 FINAL  Final  Blood Culture (routine x 2)     Status: None   Collection Time: 01/09/22  6:00 PM   Specimen: BLOOD  Result Value Ref Range Status   Specimen Description   Final    BLOOD LEFT ANTECUBITAL Performed at [facility] [address], [place], [place] [address]    Special Requests   Final    BOTTLES DRAWN AEROBIC AND ANAEROBIC Blood  Culture adequate volume Performed at [facility] [address] [place], [place] [address]    Culture   Final    NO GROWTH 5 DAYS Performed at [facility], [place] [address], [place], [place] [address]    Report Status 01/14/2022 FINAL  Final  Urine Culture     Status: Abnormal   Collection Time: 01/09/22  6:00 PM   Specimen: In/Out Cath Urine  Result Value Ref Range Status   Specimen Description   Final    IN/OUT CATH URINE Performed at [facility] [address], [place], [place] [address]    Special Requests   Final    NONE Performed at [facility], [place] [address] [place], [place] [address]    Culture 5,000 COLONIES/mL STAPHYLOCOCCUS EPIDERMIDIS (A)  Final   Report Status 01/11/2022 FINAL  Final   Organism ID, Bacteria STAPHYLOCOCCUS EPIDERMIDIS (A)  Final      Susceptibility   Staphylococcus epidermidis - MIC*    CIPROFLOXACIN <=0.5 SENSITIVE Sensitive     GENTAMICIN <=0.5 SENSITIVE Sensitive     NITROFURANTOIN <=16 SENSITIVE Sensitive     OXACILLIN >=4 RESISTANT Resistant     TETRACYCLINE >=16 RESISTANT Resistant     VANCOMYCIN 1 SENSITIVE Sensitive     TRIMETH/SULFA <=10 SENSITIVE Sensitive     CLINDAMYCIN 2 INTERMEDIATE Intermediate     RIFAMPIN 2 INTERMEDIATE Intermediate     Inducible Clindamycin NEGATIVE Sensitive     * 5,000 COLONIES/mL STAPHYLOCOCCUS EPIDERMIDIS  Aerobic/Anaerobic Culture w Gram Stain (surgical/deep wound)     Status: None   Collection Time: 01/09/22  9:11 PM   Specimen: PATH Bone biopsy; Tissue  Result Value Ref Range Status   Specimen Description ABSCESS  Final   Special Requests PERIMANDIBULAR  Final   Gram Stain   Final    FEW WBC PRESENT,BOTH PMN AND MONONUCLEAR FEW GRAM POSITIVE COCCI IN CHAINS    Culture   Final    FEW ENTEROBACTER CLOACAE FEW ACTINOMYCES NAESLUNDII Standardized susceptibility testing for this organism is not available. NO ANAEROBES  ISOLATED Performed at [facility], [place] [address] [place], [place] [address]    Report Status 01/14/2022 FINAL  Final   Organism ID, Bacteria ENTEROBACTER CLOACAE  Final      Susceptibility   Enterobacter cloacae - MIC*    CEFAZOLIN >=64 RESISTANT Resistant  CEFEPIME <=0.12 SENSITIVE Sensitive     CEFTAZIDIME <=1 SENSITIVE Sensitive     CIPROFLOXACIN <=0.25 SENSITIVE Sensitive     GENTAMICIN <=1 SENSITIVE Sensitive     IMIPENEM 0.5 SENSITIVE Sensitive     TRIMETH/SULFA <=20 SENSITIVE Sensitive     PIP/TAZO <=4 SENSITIVE Sensitive     * FEW ENTEROBACTER CLOACAE         Radiology Studies: No results found.      Scheduled Meds:  acidophilus  2 capsule Oral TID   chlorhexidine  15 mL Mouth/Throat Q4H   Chlorhexidine Gluconate Cloth  6 each Topical Daily   enoxaparin (LOVENOX) injection  30 mg Subcutaneous QPM   levothyroxine  75 mcg Oral Q0600   metoprolol succinate  25 mg Oral BID   pantoprazole  40 mg Oral Daily   simvastatin  20 mg Oral QHS   sodium chloride flush  10-40 mL Intracatheter Q12H   Continuous Infusions:  ertapenem 1,000 mg (01/14/22 1410)   lactated ringers      Assessment & Plan:   Principal Problem:   Osteomyelitis of mandible Active Problems:   Hyperlipidemia   Chronic anemia   HTN (hypertension)   Depression with anxiety   SVT (supraventricular tachycardia) (HCC)   Sinus tachycardia   Severe sepsis (HCC)   Abscess of jaw, left   Cellulitis of left jaw   Hypothyroidism   AKI (acute kidney injury) (HCC)   Nausea and vomiting   Hypokalemia   * Severe sepsis ([place])- (present on admission) Patient with leukocytosis, tachycardia and tachypnea. Urinalysis unremarkable. Blood and urine cultures obtained on admission. 3/6 due to left mandibular infection below Continue iv abx as below due to left mandibular infection below        Cellulitis/abscess of left perimandibular ENT following, recommended transfer to [facility] for  I&D On Unasyn Postop day 1 status post I&D IV Decadron 8 mg 3 times daily for 3 total doses-completed Follow-up Intra-Op culture/biopsy Recommend Peridex swish and spit every 4 hours Apply warm compresses to the mandible with gentle massage Wound culture with few WBCs gram-positive cocci in chains Diet tolerated Surgical pathology reveals osteomyelitis Spoke to ENT today recommends outpatient follow-up with oral surgeon ID following input was appreciated, see below Lt side more swollen today, will monitor , if continues to increase, will give dose of iv steroids, d/w ENT . 3/6 wbc up Wbc up but ? Due to steroids , but stopped couple of days ago. Did have diarrhea which resolved.may be due to hemoconcentration Will start ivf for hydration and monitor labs afebrile    Lt mandibular osteomyelitis Surgical path + ID was consulted, input was appreciated-recommend switching IV antibiotics to IV ertapenem 1 g daily and plan on 6 weeks of IV therapy at home via home health Check baseline ESR, CRP Place PICC line Follow-up with ID on 01/29/2022 at 11 AM with Dr. Tommy Medal 3/6 follow-up oral surgery as outpatient    AKI (acute kidney injury) ([place]) Improved with IV fluids    Hypothyroidism Continue Synthroid    Sinus tachycardia(present on admission) PSVT-  Patient with a history of sinus tachycardia in addition to SVT. Seen by cardiology as an outpatient. On metoprolol and diltiazem. Complicated by sepsis. 3/6 with tachy and lower bp likely from slight dehydration from diarrhea, will start gentle hydration   Hypokalemia Replaced   Depression with anxiety- (present on admission) Patient is on Xanax prn as an outpatient. Held on admission.   HTN (hypertension)- (present on  admission) Bp low Decrease beta blk    Chronic anemia- (present on admission) Baseline hemoglobin appears to be around 11. Currently stable.   Hyperlipidemia- (present on admission) Patient is on  simvastatin as an outpatient. Simvastatin held on admission.     DVT prophylaxis: SCD Code Status: Full Family Communication: none at bedisde Disposition Plan:  Status is: Inpatient Remains inpatient appropriate because: IV treatment, needs teaching on picc/iv abx. Having diarrhea, need to monitor. Face more swollen today need to monitor Wbc up. Tachycardic and lower bp today from slight dehydration from diarrhea                LOS: 5 days   Time spent: 35 minutes with 50% on [place], MD [facility] Pager [phone]  If 7PM-7AM, please contact night-coverage 01/14/2022, 2:48 PM

## 2022-01-14 NOTE — Care Management Important Message (Signed)
Important Message ? ?Patient Details  ?Name: Karina [name] ?MRN: [id number] ?Date of Birth: 09/30/1933 ? ? ?Medicare Important Message Given:  Yes ? ? ? ? ?Shelda Altes ?01/14/2022, 9:18 AM ?

## 2022-01-15 ENCOUNTER — Inpatient Hospital Stay (HOSPITAL_COMMUNITY): Payer: Medicare HMO

## 2022-01-15 DIAGNOSIS — R011 Cardiac murmur, unspecified: Secondary | ICD-10-CM

## 2022-01-15 DIAGNOSIS — M272 Inflammatory conditions of jaws: Secondary | ICD-10-CM | POA: Diagnosis not present

## 2022-01-15 LAB — ECHOCARDIOGRAM COMPLETE
AR max vel: 1.74 cm2
AV Area VTI: 1.96 cm2
AV Area mean vel: 1.71 cm2
AV Mean grad: 4 mmHg
AV Peak grad: 7.3 mmHg
Ao pk vel: 1.35 m/s
Area-P 1/2: 3.91 cm2
Calc EF: 51 %
Height: 63 in
MV VTI: 1.75 cm2
S' Lateral: 2.7 cm
Single Plane A2C EF: 46.2 %
Single Plane A4C EF: 58.3 %
Weight: 1936 oz

## 2022-01-15 LAB — CBC
HCT: 30.6 % — ABNORMAL LOW (ref 36.0–46.0)
Hemoglobin: 9.9 g/dL — ABNORMAL LOW (ref 12.0–15.0)
MCH: 28.6 pg (ref 26.0–34.0)
MCHC: 32.4 g/dL (ref 30.0–36.0)
MCV: 88.4 fL (ref 80.0–100.0)
Platelets: 273 10*3/uL (ref 150–400)
RBC: 3.46 MIL/uL — ABNORMAL LOW (ref 3.87–5.11)
RDW: 13.3 % (ref 11.5–15.5)
WBC: 18.7 10*3/uL — ABNORMAL HIGH (ref 4.0–10.5)
nRBC: 0 % (ref 0.0–0.2)

## 2022-01-15 LAB — CREATININE, SERUM
Creatinine, Ser: 1.08 mg/dL — ABNORMAL HIGH (ref 0.44–1.00)
GFR, Estimated: 50 mL/min — ABNORMAL LOW (ref >60)

## 2022-01-15 MED ORDER — IOHEXOL 300 MG/ML  SOLN
75.0000 mL | Freq: Once | INTRAMUSCULAR | Status: AC | PRN
  Administered 2022-01-15: 75 mL via INTRAVENOUS

## 2022-01-15 NOTE — Progress Notes (Signed)
PROGRESS NOTE    Karina [name]  KPT:[id number] DOB: [birth year] DOA: 01/09/2022 PCP: Burnard Bunting, MD    Brief Narrative:  Karina [name] is a 88 y.o. female with a history of anemia, hypothyroidism, SVT/sinus tachycardia, hypertension, anxiety. Patient presented secondary to falling this morning. She reports failing this morning after feeling lightheaded. No associated dyspnea, chest pain or palpitations. Patient unable to provide much more history and further history provided by niece. This morning, patient's sister called and paitent told her sister than she fell on the floor. Patient sister called the patient's son and called 42. Patient does report some left jaw/neck swelling that started three days prior to admission. Patient does report taking Tylenol for some pain.  She was found with left perimandibular cellulitis/abscess.  ENT I&D .ID was consulted due to patient being found with left mandibular osteomyelitis.     Consultants:  ENT, ID  Procedures:   Antimicrobials:  invanz   Subjective: Diarrhea has slowed down.  No shortness of breath or chest pain. Lt mandible less pain , still swollen  Objective: Vitals:   01/14/22 2350 01/15/22 0529 01/15/22 0742 01/15/22 1142  BP: (!) 159/78 133/63 (!) 163/76 129/65  Pulse: 100 86 84 92  Resp: 18 20 17 20   Temp: 100.2 F (37.9 C) 98 F (36.7 C) 98 F (36.7 C) 97.8 F (36.6 C)  TempSrc: Oral Oral Oral Oral  SpO2: 90% 93% 97% 96%  Weight:      Height:        Intake/Output Summary (Last 24 hours) at 01/15/2022 1154 Last data filed at 01/15/2022 1700 Gross per 24 hour  Intake 1299.17 ml  Output 0 ml  Net 1299.17 ml   Filed Weights   01/09/22 1326  Weight: 54.9 kg    Examination: Calm, NAD Lt mandible less pain to touch, no erythema, mildly still swollen Cta no w/r Reg s1/s2 no gallop Soft benign +bs No edema Aaoxox3  Mood and affect appropriate in current setting      Data Reviewed: I have personally  reviewed following labs and imaging studies  CBC: Recent Labs  Lab 01/09/22 1333 01/09/22 1441 01/10/22 0129 01/11/22 0200 01/14/22 1138 01/15/22 0948  WBC 17.6*  --  15.1* 12.5* 16.8* 18.7*  NEUTROABS 13.5*  --   --   --   --   --   HGB 11.1* 10.9* 9.9* 9.7* 10.1* 9.9*  HCT 34.8* 32.0* 30.4* 30.3* 31.1* 30.6*  MCV 89.9  --  87.9 88.6 89.6 88.4  PLT 266  --  206 223 270 174   Basic Metabolic Panel: Recent Labs  Lab 01/09/22 1333 01/09/22 1441 01/10/22 0129 01/13/22 1543 01/14/22 0615 01/15/22 0826  NA 138 142 138  --  138  --   K 3.0* 3.2* 3.9 3.3* 3.9  --   CL 103 105 106  --  101  --   CO2 23  --  22  --  30  --   GLUCOSE 190* 135* 183*  --  106*  --   BUN 34* 30* 16  --  20  --   CREATININE 1.30* 1.10* 1.06*  --  1.11* 1.08*  CALCIUM 9.3  --  8.4*  --  8.8*  --    GFR: Estimated Creatinine Clearance: 30.9 mL/min (A) (by C-G formula based on SCr of 1.08 mg/dL (H)). Liver Function Tests: Recent Labs  Lab 01/09/22 1333  AST 26  ALT 19  ALKPHOS 91  BILITOT 0.4  PROT  8.4*  ALBUMIN 3.6   No results for input(s): LIPASE, AMYLASE in the last 168 hours. No results for input(s): AMMONIA in the last 168 hours. Coagulation Profile: Recent Labs  Lab 01/09/22 1333  INR 1.1   Cardiac Enzymes: No results for input(s): CKTOTAL, CKMB, CKMBINDEX, TROPONINI in the last 168 hours. BNP (last 3 results) No results for input(s): PROBNP in the last 8760 hours. HbA1C: No results for input(s): HGBA1C in the last 72 hours. CBG: Recent Labs  Lab 01/09/22 1328  GLUCAP 200*   Lipid Profile: No results for input(s): CHOL, HDL, LDLCALC, TRIG, CHOLHDL, LDLDIRECT in the last 72 hours. Thyroid Function Tests: No results for input(s): TSH, T4TOTAL, FREET4, T3FREE, THYROIDAB in the last 72 hours. Anemia Panel: No results for input(s): VITAMINB12, FOLATE, FERRITIN, TIBC, IRON, RETICCTPCT in the last 72 hours. Sepsis Labs: Recent Labs  Lab 01/09/22 1333 01/09/22 1600   LATICACIDVEN 2.8* 1.7    Recent Results (from the past 240 hour(s))  Resp Panel by RT-PCR (Flu A&B, Covid)     Status: None   Collection Time: 01/09/22  1:33 PM   Specimen: Nasopharyngeal(NP) swabs in vial transport medium  Result Value Ref Range Status   SARS Coronavirus 2 by RT PCR NEGATIVE NEGATIVE Final    Comment: (NOTE) SARS-CoV-2 target nucleic acids are NOT DETECTED.  The SARS-CoV-2 RNA is generally detectable in upper respiratory specimens during the acute phase of infection. The lowest concentration of SARS-CoV-2 viral copies this assay can detect is 138 copies/mL. A negative result does not preclude SARS-Cov-2 infection and should not be used as the sole basis for treatment or other patient management decisions. A negative result may occur with  improper specimen collection/handling, submission of specimen other than nasopharyngeal swab, presence of viral mutation(s) within the areas targeted by this assay, and inadequate number of viral copies(<138 copies/mL). A negative result must be combined with clinical observations, patient history, and epidemiological information. The expected result is Negative.  Fact Sheet for Patients:  EntrepreneurPulse.com.au  Fact Sheet for Healthcare Providers:  IncredibleEmployment.be  This test is no t yet approved or cleared by the [place] FDA and  has been authorized for detection and/or diagnosis of SARS-CoV-2 by FDA under an Emergency Use Authorization (EUA). This EUA will remain  in effect (meaning this test can be used) for the duration of the COVID-19 declaration under Section 564(b)(1) of the Act, 21 U.S.C.section 360bbb-3(b)(1), unless the authorization is terminated  or revoked sooner.       Influenza A by PCR NEGATIVE NEGATIVE Final   Influenza B by PCR NEGATIVE NEGATIVE Final    Comment: (NOTE) The Xpert Xpress SARS-CoV-2/FLU/RSV plus assay is intended as an aid in the  diagnosis of influenza from Nasopharyngeal swab specimens and should not be used as a sole basis for treatment. Nasal washings and aspirates are unacceptable for Xpert Xpress SARS-CoV-2/FLU/RSV testing.  Fact Sheet for Patients: EntrepreneurPulse.com.au  Fact Sheet for Healthcare Providers: IncredibleEmployment.be  This test is not yet approved or cleared by the [place] FDA and has been authorized for detection and/or diagnosis of SARS-CoV-2 by FDA under an Emergency Use Authorization (EUA). This EUA will remain in effect (meaning this test can be used) for the duration of the COVID-19 declaration under Section 564(b)(1) of the Act, 21 U.S.C. section 360bbb-3(b)(1), unless the authorization is terminated or revoked.  Performed at [facility], [place] [address], [place], [place] [address]   Blood Culture (routine x 2)  Status: None   Collection Time: 01/09/22  1:44 PM   Specimen: BLOOD  Result Value Ref Range Status   Specimen Description   Final    BLOOD RIGHT ANTECUBITAL Performed at [place] [address] [place], [place] [address]    Special Requests   Final    BOTTLES DRAWN AEROBIC AND ANAEROBIC Blood Culture adequate volume Performed at [facility] [address] [place], [place] [address]    Culture   Final    NO GROWTH 5 DAYS Performed at [facility], [place] [address], [place], [place] [address]    Report Status 01/14/2022 FINAL  Final  Blood Culture (routine x 2)     Status: None   Collection Time: 01/09/22  6:00 PM   Specimen: BLOOD  Result Value Ref Range Status   Specimen Description   Final    BLOOD LEFT ANTECUBITAL Performed at [place] [facility] [address] [place], [place] [address]    Special Requests   Final    BOTTLES DRAWN AEROBIC AND ANAEROBIC Blood Culture adequate volume Performed at [facility] [address], [place], [place] [address]    Culture   Final    NO GROWTH 5 DAYS Performed at [facility], [place] [address] [place], [place] [address]    Report Status 01/14/2022 FINAL  Final  Urine Culture     Status: Abnormal   Collection Time: 01/09/22  6:00 PM   Specimen: In/Out Cath Urine  Result Value Ref Range Status   Specimen Description   Final    IN/OUT CATH URINE Performed at [facility] [place] [address], [place], [place] [address]    Special Requests   Final    NONE Performed at [facility], [place] [address], [place], [place] [address]    Culture 5,000 COLONIES/mL STAPHYLOCOCCUS EPIDERMIDIS (A)  Final   Report Status 01/11/2022 FINAL  Final   Organism ID, Bacteria STAPHYLOCOCCUS EPIDERMIDIS (A)  Final      Susceptibility   Staphylococcus epidermidis - MIC*    CIPROFLOXACIN <=0.5 SENSITIVE Sensitive     GENTAMICIN <=0.5 SENSITIVE Sensitive     NITROFURANTOIN <=16 SENSITIVE Sensitive     OXACILLIN >=4 RESISTANT Resistant     TETRACYCLINE >=16 RESISTANT Resistant     VANCOMYCIN 1 SENSITIVE Sensitive     TRIMETH/SULFA <=10 SENSITIVE Sensitive     CLINDAMYCIN 2 INTERMEDIATE Intermediate     RIFAMPIN 2 INTERMEDIATE Intermediate     Inducible Clindamycin NEGATIVE Sensitive     * 5,000 COLONIES/mL STAPHYLOCOCCUS EPIDERMIDIS  Aerobic/Anaerobic Culture w Gram Stain (surgical/deep wound)     Status: None   Collection Time: 01/09/22  9:11 PM   Specimen: PATH Bone biopsy; Tissue  Result Value Ref Range Status   Specimen Description ABSCESS  Final   Special Requests PERIMANDIBULAR  Final   Gram Stain   Final    FEW WBC PRESENT,BOTH PMN AND MONONUCLEAR FEW GRAM POSITIVE COCCI IN CHAINS    Culture   Final    FEW ENTEROBACTER CLOACAE FEW ACTINOMYCES NAESLUNDII Standardized susceptibility testing for this organism is not available. NO ANAEROBES ISOLATED Performed at [facility], [place] [address], [place], [place]  [address]    Report Status 01/14/2022 FINAL  Final   Organism ID, Bacteria ENTEROBACTER CLOACAE  Final      Susceptibility   Enterobacter cloacae - MIC*    CEFAZOLIN >=64 RESISTANT Resistant  CEFEPIME <=0.12 SENSITIVE Sensitive     CEFTAZIDIME <=1 SENSITIVE Sensitive     CIPROFLOXACIN <=0.25 SENSITIVE Sensitive     GENTAMICIN <=1 SENSITIVE Sensitive     IMIPENEM 0.5 SENSITIVE Sensitive     TRIMETH/SULFA <=20 SENSITIVE Sensitive     PIP/TAZO <=4 SENSITIVE Sensitive     * FEW ENTEROBACTER CLOACAE         Radiology Studies: No results found.      Scheduled Meds:  acidophilus  2 capsule Oral TID   chlorhexidine  15 mL Mouth/Throat Q4H   Chlorhexidine Gluconate Cloth  6 each Topical Daily   enoxaparin (LOVENOX) injection  30 mg Subcutaneous QPM   levothyroxine  75 mcg Oral Q0600   metoprolol succinate  25 mg Oral BID   pantoprazole  40 mg Oral Daily   simvastatin  20 mg Oral QHS   sodium chloride flush  10-40 mL Intracatheter Q12H   Continuous Infusions:  ertapenem 1,000 mg (01/14/22 1410)    Assessment & Plan:   Principal Problem:   Osteomyelitis of mandible Active Problems:   Hyperlipidemia   Chronic anemia   HTN (hypertension)   Depression with anxiety   SVT (supraventricular tachycardia) (HCC)   Sinus tachycardia   Severe sepsis (HCC)   Abscess of jaw, left   Cellulitis of left jaw   Hypothyroidism   AKI (acute kidney injury) (HCC)   Nausea and vomiting   Hypokalemia   * Severe sepsis ([place])- (present on admission) Patient with leukocytosis, tachycardia and tachypnea. Urinalysis unremarkable. Blood and urine cultures obtained on admission. 3/7 due to left mandibular infection below  Continue IV antibiotics         Cellulitis/abscess of left perimandibular ENT following, recommended transfer to [facility] for I&D status post I&D IV Decadron 8 mg 3 times daily for 3 total doses-completed ENT Recommend Peridex swish and spit every 4 hours Apply  warm compresses to the mandible with gentle massage Wound culture with few WBCs gram-positive cocci in chains Diet tolerated Wound cx Culture Result: Enterobacter cloacae and Actinomyces naeslundii   Surgical pathology reveals osteomyelitis Spoke to ENT today recommends outpatient follow-up with oral surgeon ID following input was appreciated, see below 3/7 wbc continues to trend up We will repeat CT neck imaging for reevaluation She has been off of steroids for few days Continue with IV antibiotics     Lt mandibular osteomyelitis Surgical path + ID was consulted, input was appreciated-recommend switching IV unasyn to IV ertapenem 1 g daily and plan on 6 weeks of IV therapy at home via home health Checked baseline ESR, CRP Placed PICC line Follow-up with ID on 01/29/2022 at 11 AM with Dr. Tommy Medal 3/7 needs to f./u with oral surgery as outpatient on discharge Repeating ct neck today since wbc trending up and Lt face still more swollen last two days    AKI (acute kidney injury) ([place]) Improved with ivf    Hypothyroidism Continue Synthroid    Sinus tachycardia(present on admission) PSVT-  Patient with a history of sinus tachycardia in addition to SVT. Seen by cardiology as an outpatient. On metoprolol and diltiazem. Complicated by sepsis. 3/6 with tachy and lower bp likely from slight dehydration from diarrhea,, started on ivf  3/7 d/c ivf.     Hypokalemia Replaced   Depression with anxiety- (present on admission) Patient is on Xanax prn as an outpatient. Held on admission.   HTN (hypertension)- (present on admission) Bp low Decreased beta blk    Chronic  anemia- (present on admission) Baseline hemoglobin appears to be around 11. Currently stable.   Hyperlipidemia- (present on admission) Patient is on simvastatin as an outpatient.      DVT prophylaxis: SCD Code Status: Full Family Communication: none at bedisde Disposition Plan:  Status is:  Inpatient Remains inpatient appropriate because: IV treatment, face more swollen, wbc up , will obtain ct neck for reevaluation.                LOS: 6 days   Time spent: 35 minutes with 50% on [place], MD [facility] Pager [phone]  If 7PM-7AM, please contact night-coverage 01/15/2022, 11:54 AM

## 2022-01-15 NOTE — Progress Notes (Signed)
[facility] Infectious Diseases Follow Up Note  Patient Identification: Patient Name: Karina [name] MRN: [id number] [place] Date: 01/09/2022  1:09 PM Age: 88 y.o.Today's Date: 01/15/2022  Reason for Visit: Mandibular osteomyelitis   Principal Problem:   Osteomyelitis of mandible Active Problems:   Hyperlipidemia   Chronic anemia   HTN (hypertension)   Depression with anxiety   SVT (supraventricular tachycardia) (HCC)   Sinus tachycardia   Severe sepsis (HCC)   Abscess of jaw, left   Cellulitis of left jaw   Hypothyroidism   AKI (acute kidney injury) (HCC)   Nausea and vomiting   Hypokalemia   Antibiotics:  Vancomycin 3/1 Ceftriaxone 3/1 Ampsulbactam 3/1- 3/4 Ertapenem 3/4 - c   Lines/Hardwares: RT arm PICC  Interval Events: Low grade fevers yesterday, WBC trending up to 18 today, soft BP. has some loose stools,   Assessment Left perimandibular abscess and osteomyelitis  - s/p I and D on 3/1. Cultures growing Enterobacter cloacae and actinomyces naeslundii ( final ). Path s/o abscess and osteomyelitis  - Has PICC  - On IV ertapenem   2. Urine cultures + for 5000 colonies MRSE - Likely colonization, no symptoms and UA unremarkable   3. Leukocytosis/Low grade fevers/soft BP - she had one episode of left sided jaw pain that was worse yesterday which is better today. Left sided jaw with some fullness but with no signs of inflammation  4. Loose stools - no abdominal pain, likely cause of dehydration   Recommendations Would get a repeat CT soft tissue neck to make sure no worsening findings in the mandibular area given low grade fevers/pain and uptrending leukocytosis esp given she is planned for discharge today Continue IV ertapenem as planned Monitor Fevers, CBC and CT neck  Discussed with patient/sister and primary   Rest of the management as per the primary team. Thank you for the consult. Please page with pertinent  questions or concerns.  ______________________________________________________________________ Subjective patient seen and examined at the bedside. Sister at bedside One episode of left jaw pain yesterday  Sister feels she is more weak today Mild tachycardia and tachypnea with soft BP   Vitals BP (!) 163/76 (BP Location: Left Arm)    Pulse 84    Temp 98 F (36.7 C) (Oral)    Resp 17    Ht '5\' 3"'$  (1.6 m)    Wt 54.9 kg    SpO2 97%    BMI 21.43 kg/m     Physical Exam Constitutional:  lying in the bed and sleeping     Comments: mild tenderness at the left angle of jaw, no fluctuance   Cardiovascular:     Rate and Rhythm: Normal rate and regular rhythm.     Heart sounds:   Pulmonary:     Effort: Pulmonary effort is normal.     Comments: clear lung sounds   Abdominal:     Palpations: Abdomen is soft.     Tenderness: non tender and non distended   Musculoskeletal:        General: No swelling or tenderness.   Skin:    Comments:   Neurological:     General: Grossly non focal, awake, alert and oriented   Psychiatric:        Mood and Affect: Mood normal.     Pertinent Microbiology Results for orders placed or performed during the hospital encounter of 01/09/22  Resp Panel by RT-PCR (Flu A&B, Covid)     Status: None   Collection Time: 01/09/22  1:33 PM   Specimen: Nasopharyngeal(NP) swabs in vial transport medium  Result Value Ref Range Status   SARS Coronavirus 2 by RT PCR NEGATIVE NEGATIVE Final    Comment: (NOTE) SARS-CoV-2 target nucleic acids are NOT DETECTED.  The SARS-CoV-2 RNA is generally detectable in upper respiratory specimens during the acute phase of infection. The lowest concentration of SARS-CoV-2 viral copies this assay can detect is 138 copies/mL. A negative result does not preclude SARS-Cov-2 infection and should not be used as the sole basis for treatment or other patient management decisions. A negative result may occur with  improper specimen  collection/handling, submission of specimen other than nasopharyngeal swab, presence of viral mutation(s) within the areas targeted by this assay, and inadequate number of viral copies(<138 copies/mL). A negative result must be combined with clinical observations, patient history, and epidemiological information. The expected result is Negative.  Fact Sheet for Patients:  EntrepreneurPulse.com.au  Fact Sheet for Healthcare Providers:  IncredibleEmployment.be  This test is no t yet approved or cleared by the [place] FDA and  has been authorized for detection and/or diagnosis of SARS-CoV-2 by FDA under an Emergency Use Authorization (EUA). This EUA will remain  in effect (meaning this test can be used) for the duration of the COVID-19 declaration under Section 564(b)(1) of the Act, 21 U.S.C.section 360bbb-3(b)(1), unless the authorization is terminated  or revoked sooner.       Influenza A by PCR NEGATIVE NEGATIVE Final   Influenza B by PCR NEGATIVE NEGATIVE Final    Comment: (NOTE) The Xpert Xpress SARS-CoV-2/FLU/RSV plus assay is intended as an aid in the diagnosis of influenza from Nasopharyngeal swab specimens and should not be used as a sole basis for treatment. Nasal washings and aspirates are unacceptable for Xpert Xpress SARS-CoV-2/FLU/RSV testing.  Fact Sheet for Patients: EntrepreneurPulse.com.au  Fact Sheet for Healthcare Providers: IncredibleEmployment.be  This test is not yet approved or cleared by the [place] FDA and has been authorized for detection and/or diagnosis of SARS-CoV-2 by FDA under an Emergency Use Authorization (EUA). This EUA will remain in effect (meaning this test can be used) for the duration of the COVID-19 declaration under Section 564(b)(1) of the Act, 21 U.S.C. section 360bbb-3(b)(1), unless the authorization is terminated or revoked.  Performed at [facility], [place] [address], [place], [place] [address]   Blood Culture (routine x 2)     Status: None   Collection Time: 01/09/22  1:44 PM   Specimen: BLOOD  Result Value Ref Range Status   Specimen Description   Final    BLOOD RIGHT ANTECUBITAL Performed at [facility] [address] [place], [place] [address]    Special Requests   Final    BOTTLES DRAWN AEROBIC AND ANAEROBIC Blood Culture adequate volume Performed at [facility] [address], [place], [place] [address]    Culture   Final    NO GROWTH 5 DAYS Performed at [facility], [place] [address], [place], [place] [address]    Report Status 01/14/2022 FINAL  Final  Blood Culture (routine x 2)     Status: None   Collection Time: 01/09/22  6:00 PM   Specimen: BLOOD  Result Value Ref Range Status   Specimen Description   Final    BLOOD LEFT ANTECUBITAL Performed at [facility] [address], [place], [place] [address]    Special Requests   Final    BOTTLES DRAWN AEROBIC AND ANAEROBIC  Blood Culture adequate volume Performed at [facility] [address], [place], [place] [address]    Culture   Final    NO GROWTH 5 DAYS Performed at [facility], [place] [address], [place], [place] [address]    Report Status 01/14/2022 FINAL  Final  Urine Culture     Status: Abnormal   Collection Time: 01/09/22  6:00 PM   Specimen: In/Out Cath Urine  Result Value Ref Range Status   Specimen Description   Final    IN/OUT CATH URINE Performed at [facility] [address], [place], [place] [address]    Special Requests   Final    NONE Performed at [facility], [place] [address] [place], [place] [address]    Culture 5,000 COLONIES/mL STAPHYLOCOCCUS EPIDERMIDIS (A)  Final   Report Status 01/11/2022 FINAL  Final   Organism ID, Bacteria STAPHYLOCOCCUS EPIDERMIDIS (A)  Final       Susceptibility   Staphylococcus epidermidis - MIC*    CIPROFLOXACIN <=0.5 SENSITIVE Sensitive     GENTAMICIN <=0.5 SENSITIVE Sensitive     NITROFURANTOIN <=16 SENSITIVE Sensitive     OXACILLIN >=4 RESISTANT Resistant     TETRACYCLINE >=16 RESISTANT Resistant     VANCOMYCIN 1 SENSITIVE Sensitive     TRIMETH/SULFA <=10 SENSITIVE Sensitive     CLINDAMYCIN 2 INTERMEDIATE Intermediate     RIFAMPIN 2 INTERMEDIATE Intermediate     Inducible Clindamycin NEGATIVE Sensitive     * 5,000 COLONIES/mL STAPHYLOCOCCUS EPIDERMIDIS  Aerobic/Anaerobic Culture w Gram Stain (surgical/deep wound)     Status: None   Collection Time: 01/09/22  9:11 PM   Specimen: PATH Bone biopsy; Tissue  Result Value Ref Range Status   Specimen Description ABSCESS  Final   Special Requests PERIMANDIBULAR  Final   Gram Stain   Final    FEW WBC PRESENT,BOTH PMN AND MONONUCLEAR FEW GRAM POSITIVE COCCI IN CHAINS    Culture   Final    FEW ENTEROBACTER CLOACAE FEW ACTINOMYCES NAESLUNDII Standardized susceptibility testing for this organism is not available. NO ANAEROBES ISOLATED Performed at [facility], [place] [address], [place], [place] [address]    Report Status 01/14/2022 FINAL  Final   Organism ID, Bacteria ENTEROBACTER CLOACAE  Final      Susceptibility   Enterobacter cloacae - MIC*    CEFAZOLIN >=64 RESISTANT Resistant     CEFEPIME <=0.12 SENSITIVE Sensitive     CEFTAZIDIME <=1 SENSITIVE Sensitive     CIPROFLOXACIN <=0.25 SENSITIVE Sensitive     GENTAMICIN <=1 SENSITIVE Sensitive     IMIPENEM 0.5 SENSITIVE Sensitive     TRIMETH/SULFA <=20 SENSITIVE Sensitive     PIP/TAZO <=4 SENSITIVE Sensitive     * FEW ENTEROBACTER CLOACAE    Pertinent Pathology  FINAL MICROSCOPIC DIAGNOSIS:   A. SOFT TISSUE, LEFT PERIMANDIBULAR, BIOPSY:  - Skin and underlying soft tissue with acute and chronic inflammation,  consistent with abscess  - Scant fragments of bone with granulation tissue, consistent with   osteomyelitis   Pertinent Lab. CBC Latest Ref Rng & Units 01/15/2022 01/14/2022 01/11/2022  WBC 4.0 - 10.5 K/uL 18.7(H) 16.8(H) 12.5(H)  Hemoglobin 12.0 - 15.0 g/dL 9.9(L) 10.1(L) 9.7(L)  Hematocrit 36.0 - 46.0 % 30.6(L) 31.1(L) 30.3(L)  Platelets 150 - 400 K/uL 273 270 223   CMP Latest Ref Rng & Units 01/15/2022 01/14/2022 01/13/2022  Glucose 70 - 99 mg/dL - 106(H) -  BUN 8 - 23 mg/dL - 20 -  Creatinine 0.44 - 1.00 mg/dL 1.08(H) 1.11(H) -  Sodium 135 - 145 mmol/L - 138 -  Potassium 3.5 - 5.1 mmol/L - 3.9 3.3(L)  Chloride 98 - 111 mmol/L - 101 -  CO2 22 - 32 mmol/L - 30 -  Calcium 8.9 - 10.3 mg/dL - 8.8(L) -  Total Protein 6.5 - 8.1 g/dL - - -  Total Bilirubin 0.3 - 1.2 mg/dL - - -  Alkaline Phos 38 - 126 U/L - - -  AST 15 - 41 U/L - - -  ALT 0 - 44 U/L - - -     Pertinent Imaging today Plain films and CT images have been personally visualized and interpreted; radiology reports have been reviewed. Decision making incorporated into the Impression / Recommendations.  No results found.  I spent more than 35 minutes for this patient encounter including review of prior medical records, coordination of care with primary/other specialist with greater than 50% of time being face to face/counseling and discussing diagnostics/treatment plan with the patient/family.  Electronically signed by:   Rosiland Oz, MD Infectious Disease Physician [facility] for Infectious Disease Pager: [phone]

## 2022-01-15 NOTE — Progress Notes (Signed)
Mobility Specialist Progress Note ? ? 01/15/22 1600  ?Mobility  ?Activity Ambulated with assistance in hallway  ?Level of Assistance Minimal assist, patient does 75% or more  ?Assistive Device Front wheel walker  ?Distance Ambulated (ft) 220 ft  ?Activity Response Tolerated well  ?$Mobility charge 1 Mobility  ? ?Received pt asleep in bed, niece claims pt has been tired all day and has not been able to eat. Pt still agreeable to mobility. Prior to ambulation, successful void to [facility] w/ no faults during tx. Requiring min VC for RW mechanics and positioning. Returned back to chair w/ call bell by side and family in room.  ? ?Pre Mobility:  110 HR, BP, SpO2 ?During Mobility: 136 HR ?Post Mobility: 107 HR ? ?Holland Falling ?Mobility Specialist ?Phone Number [phone] ? ?

## 2022-01-16 ENCOUNTER — Other Ambulatory Visit (HOSPITAL_COMMUNITY): Payer: Self-pay

## 2022-01-16 DIAGNOSIS — D649 Anemia, unspecified: Secondary | ICD-10-CM | POA: Diagnosis not present

## 2022-01-16 DIAGNOSIS — N179 Acute kidney failure, unspecified: Secondary | ICD-10-CM | POA: Diagnosis not present

## 2022-01-16 DIAGNOSIS — F418 Other specified anxiety disorders: Secondary | ICD-10-CM | POA: Diagnosis not present

## 2022-01-16 DIAGNOSIS — M272 Inflammatory conditions of jaws: Secondary | ICD-10-CM | POA: Diagnosis not present

## 2022-01-16 LAB — CBC WITH DIFFERENTIAL/PLATELET
Abs Immature Granulocytes: 0.66 10*3/uL — ABNORMAL HIGH (ref 0.00–0.07)
Basophils Absolute: 0 10*3/uL (ref 0.0–0.1)
Basophils Relative: 0 %
Eosinophils Absolute: 0.4 10*3/uL (ref 0.0–0.5)
Eosinophils Relative: 3 %
HCT: 29.8 % — ABNORMAL LOW (ref 36.0–46.0)
Hemoglobin: 9.5 g/dL — ABNORMAL LOW (ref 12.0–15.0)
Immature Granulocytes: 5 %
Lymphocytes Relative: 26 %
Lymphs Abs: 3.3 10*3/uL (ref 0.7–4.0)
MCH: 28.4 pg (ref 26.0–34.0)
MCHC: 31.9 g/dL (ref 30.0–36.0)
MCV: 89.2 fL (ref 80.0–100.0)
Monocytes Absolute: 0.9 10*3/uL (ref 0.1–1.0)
Monocytes Relative: 7 %
Neutro Abs: 7.3 10*3/uL (ref 1.7–7.7)
Neutrophils Relative %: 59 %
Platelets: 292 10*3/uL (ref 150–400)
RBC: 3.34 MIL/uL — ABNORMAL LOW (ref 3.87–5.11)
RDW: 13.5 % (ref 11.5–15.5)
WBC: 12.6 10*3/uL — ABNORMAL HIGH (ref 4.0–10.5)
nRBC: 0 % (ref 0.0–0.2)

## 2022-01-16 MED ORDER — ERTAPENEM IV (FOR PTA / DISCHARGE USE ONLY): 1.0000 g | INTRAVENOUS | 0 refills | Status: AC

## 2022-01-16 MED ORDER — ACETAMINOPHEN 325 MG PO TABS
650.0000 mg | ORAL_TABLET | Freq: Four times a day (QID) | ORAL | 0 refills | Status: AC | PRN
  Filled 2022-01-16: qty 20, 3d supply, fill #0

## 2022-01-16 MED ORDER — HEPARIN SOD (PORK) LOCK FLUSH 100 UNIT/ML IV SOLN
250.0000 [IU] | INTRAVENOUS | Status: AC | PRN
  Administered 2022-01-16: 250 [IU]
  Filled 2022-01-16: qty 2.5

## 2022-01-16 MED ORDER — TRAMADOL HCL 50 MG PO TABS
50.0000 mg | ORAL_TABLET | Freq: Two times a day (BID) | ORAL | 0 refills | Status: DC | PRN
  Filled 2022-01-16: qty 10, 5d supply, fill #0

## 2022-01-16 MED ORDER — SACCHAROMYCES BOULARDII 250 MG PO CAPS
250.0000 mg | ORAL_CAPSULE | Freq: Three times a day (TID) | ORAL | 0 refills | Status: DC
  Filled 2022-01-16: qty 20, 7d supply, fill #0

## 2022-01-16 MED ORDER — ONDANSETRON HCL 4 MG PO TABS
4.0000 mg | ORAL_TABLET | Freq: Four times a day (QID) | ORAL | 0 refills | Status: DC | PRN
  Filled 2022-01-16: qty 20, 6d supply, fill #0

## 2022-01-16 NOTE — Progress Notes (Signed)
Physical Therapy Treatment ?Patient Details ?Name: Karina [name] ?MRN: [id number] ?DOB: 1933-04-20 ?Today's Date: 01/16/2022 ? ? ?History of Present Illness The pt is an 88 yo female presenting 3/1 with generalized weakness and infection in L jaw (red, swollen). S/p draining of L perimandibular abscess on 3/1, undergoing continued work up for sepsis. Family also reports a fall morning of 3/1. PMH includes: anemia, hypothyroidism, SVT, HTN, anxiety. ? ?  ?PT Comments  ? ? Pt received in supine, agreeable to therapy session and with good participation and tolerance for gait trial with completion of Dynamic Gait Index tasks. Pt scored 15/24 on DGI. Dynamic Gait Index ?Scores of 19 or less are predictive of falls in older community living adults. Pt at times unsafe with RW and abandons device prior to entering bathroom, pt/family given instruction on need to stay with device all the way to toilet or chair prior to sitting it to reduce fall risk. Discussed modification of RW as per pt it feels like it is pulling to L side of hallway. Pt continues to benefit from PT services to progress toward functional mobility goals.    ?Recommendations for follow up therapy are one component of a multi-disciplinary discharge planning process, led by the attending physician.  Recommendations may be updated based on patient status, additional functional criteria and insurance authorization. ? ?Follow Up Recommendations ? Home health PT ?  ?  ?Assistance Recommended at Discharge Frequent or constant Supervision/Assistance  ?Patient can return home with the following A little help with walking and/or transfers;Assistance with cooking/housework;Assist for transportation;Help with stairs or ramp for entrance ?  ?Equipment Recommendations ? Rolling walker (2 wheels)  ?  ?Recommendations for Other Services   ? ? ?  ?Precautions / Restrictions Precautions ?Precautions: Fall ?Precaution Comments: pt with hx of 3 recent  falls ?Restrictions ?Weight Bearing Restrictions: No  ?  ? ?Mobility ? Bed Mobility ?Overal bed mobility: Needs Assistance ?Bed Mobility: Supine to Sit ?  ?  ?Supine to sit: Supervision, HOB elevated ?  ?  ?General bed mobility comments: increased time, use of bed rail ?  ? ?Transfers ?Overall transfer level: Needs assistance ?Equipment used: None ?Transfers: Sit to/from Stand ?Sit to Stand: Supervision ?  ?  ?  ?  ?  ?General transfer comment: Able to come to stand without LOB, supervision for safety; using RW upon standing; wall rail used in bathroom ?  ? ?Ambulation/Gait ?Ambulation/Gait assistance: Supervision ?Gait Distance (Feet): 200 Feet ?Assistive device: Rolling walker (2 wheels) ?Gait Pattern/deviations: Step-through pattern, Narrow base of support, Decreased stride length ?Gait velocity: decreased ?  ?  ?General Gait Details: cues for awareness of IV line as she tends to turn with it behind her prior to sitting; moderate reliance on RW, cues for upright posture/gaze; no LOB with DGI tasks, slow pace ? ? ?Stairs ?  ?  ?  ?  ?General stair comments: verbal cues for sequencing/guarding for family members; pt defer to practice, was able to perform previous session ? ? ?Wheelchair Mobility ?  ? ?Modified Rankin (Stroke Patients Only) ?  ? ? ?  ?Balance Overall balance assessment: Needs assistance, History of Falls ?Sitting-balance support: No upper extremity supported, Feet supported ?Sitting balance-Leahy Scale: Good ?  ?  ?Standing balance support: No upper extremity supported, During functional activity ?Standing balance-Leahy Scale: Fair ?Standing balance comment: moderate reliance on RW support for gait progression ?  ?  ?  ?  ?  ?  ?  ?  ?Standardized Balance  Assessment ?Standardized Balance Assessment : Dynamic Gait Index ?  ?Dynamic Gait Index ?Level Surface: Mild Impairment ?Change in Gait Speed: Mild Impairment ?Gait with Horizontal Head Turns: Mild Impairment ?Gait with Vertical Head Turns: Mild  Impairment ?Gait and Pivot Turn: Mild Impairment ?Step Over Obstacle: Mild Impairment ?Step Around Obstacles: Mild Impairment ?Steps: Moderate Impairment ?Total Score: 15 ?  ? ?  ?Cognition Arousal/Alertness: Awake/alert ?Behavior During Therapy: [facility] for tasks assessed/performed ?Overall Cognitive Status: Within Functional Limits for tasks assessed ?Area of Impairment: Safety/judgement ?  ?  ?Safety/Judgement: Decreased awareness of safety ?  ?  ?General Comments: pt with slightly reduced insight to safety and need for assistance, cooperative, discussed guarding/safety with family and pt present; pt at times abandoning RW and turns to sit with IV behind her; frequent reminders for awareness of IV line ?  ?  ? ?  ?Exercises   ? ?  ?General Comments General comments (skin integrity, edema, etc.): no dizziness reported; HR 92-115 bpm with exertion ?  ?  ? ?Pertinent Vitals/Pain Pain Assessment ?Pain Assessment: Faces ?Faces Pain Scale: No hurt ?Pain Intervention(s): Monitored during session  ? ? ? ?PT Goals (current goals can now be found in the care plan section) Acute Rehab PT Goals ?Patient Stated Goal: to go home ?PT Goal Formulation: With patient/family ?Time For Goal Achievement: 01/25/22 ?Progress towards PT goals: Progressing toward goals ? ?  ?Frequency ? ? ? Min 3X/week ? ? ? ?  ?PT Plan Current plan remains appropriate  ? ? ?Amayrany Cafaro P., PTA ?Acute Rehabilitation Services ?Pager: [phone] ?Office: [phone]    ?AM-PAC PT "6 Clicks" Mobility   ?Outcome Measure ? Help needed turning from your back to your side while in a flat bed without using bedrails?: None ?Help needed moving from lying on your back to sitting on the side of a flat bed without using bedrails?: A Little ?Help needed moving to and from a bed to a chair (including a wheelchair)?: A Little ?Help needed standing up from a chair using your arms (e.g., wheelchair or bedside chair)?: A Little ?Help needed to walk in hospital room?: A  Little ?Help needed climbing 3-5 steps with a railing? : A Little ?6 Click Score: 19 ? ?  ?End of Session Equipment Utilized During Treatment: Gait belt ?Activity Tolerance: Patient tolerated treatment well ?Patient left: with call bell/phone within reach;with family/visitor present;in chair (bed in chair posture; x3 family members present; IV team member entering room) ?Nurse Communication: Mobility status ?PT Visit Diagnosis: Other abnormalities of gait and mobility (R26.89);Repeated falls (R29.6);Unsteadiness on feet (R26.81) ?  ? ? ?Time: 1125-1140 ?PT Time Calculation (min) (ACUTE ONLY): 15 min ? ?Charges:  $Gait Training: 8-22 mins          ?          ? ?Steffani Dionisio P., PTA ?Acute Rehabilitation Services ?Pager: [phone] ?Office: [phone]  ? ? ?Kara Pacer Kamyla Olejnik ?01/16/2022, 12:26 PM ? ?

## 2022-01-16 NOTE — Progress Notes (Signed)
Discharge instructions (including medications) discussed with and copy provided to patient/caregiver 

## 2022-01-16 NOTE — TOC Transition Note (Addendum)
Transition of Care (TOC) - CM/SW Discharge Note ?Marvetta Gibbons Therapist, sports, BSN ?Transitions of Care ?Unit 4E- RN Case Manager ?See Treatment Team for direct phone #  ? ? ?Patient Details  ?Name: Karina [name] ?MRN: [id number] ?Date of Birth: 05-May-1933 ? ?Transition of Care (TOC) CM/SW Contact:  ?Dahlia Client, Romeo Rabon, RN ?Phone Number: ?01/16/2022, 11:47 AM ? ? ?Clinical Narrative:    ?Pt has been cleared for transition home today. HH and home IV abx arrangements are in place. Have spoken with [name] at [facility] this AM and everything set for home abx to be delivered later this afternoon, OPAT has been previously signed. Pt has had abx dose this am prior to discharge.  ?[place] set up with Meredeth Ide with [facility] aware of discharge and will plan for visit tomorrow in the home.  ?Education has been completed per [name] at the bedside w/ patient, sister and family members.  ? ?Rolling walker has been delivered by [employer] and is at the bedside.  ? ?Family to transport home.  ? ? ?Final next level of care: [place] ?Barriers to Discharge: Barriers Resolved ? ? ?Patient Goals and CMS Choice ?Patient states their goals for this hospitalization and ongoing recovery are:: to return home with [facility]. ?CMS Medicare.gov Compare Post Acute Care list provided to:: Patient ?Choice offered to / list presented to : Patient, Adult Children (Spoke with son Richardson Landry via the telephone.) ? ?Discharge Placement ?  ?           ?  ? Home w/ HH ?  ?  ? ?Discharge Plan and Services ?  ?Discharge Planning Services: CM Consult ?Post Acute Care Choice: Durable Medical Equipment, Home Health          ?DME Arranged: Walker rolling ?DME Agency: [employer] ?Date DME Agency Contacted: 01/14/22 ?Time DME Agency Contacted: 1000 ?Representative spoke with at DME Agency: Freda Munro ?HH Arranged: RN, Disease Management, PT, IV Antibiotics ?[place] Agency: [place] ?Date HH Agency Contacted: 01/12/22 ?Time [place]:  1500 ?Representative spoke with at [place]: Tommi Rumps ? ?Social Determinants of Health (SDOH) Interventions ?  ? ? ?Readmission Risk Interventions ?Readmission Risk Prevention Plan 01/16/2022  ?Post Dischage Appt Complete  ?Medication Screening Complete  ?Transportation Screening Complete  ?Some recent data might be hidden  ? ? ? ? ? ?

## 2022-01-16 NOTE — Assessment & Plan Note (Signed)
-   We will complete 6 weeks of ertapenem and follow-up with oral surgery and ENT as well as ID in the clinic with Dr. Drucilla Schmidt ? ?She will need weekly labs faxed to the ID clinic at [phone] ? ?

## 2022-01-16 NOTE — Progress Notes (Addendum)
I have seen and examined the patient. I have personally reviewed the clinical findings, laboratory findings, microbiological data and imaging studies. The assessment and treatment plan was discussed with the Nurse Practitioner Jeanine Luz. I agree with her/his recommendations except following additions/corrections.  Remains afebrile, WBC down from 18 to 12 Left mandibular pain is better  CT soft tissue neck with decreasing size of the perimandibular abscess ( 2.6*1.9*2.8 cm)  No new recommendations.  OPAT orders in Discussed with patient/sister and niece about symptoms and signs to seek immediate attention and Fu with ENT/Oral surgeon and ID as planned.   Karina Fraction, MD Infectious Disease Physician [facility] [address] [place], [place] [address] Phone: [phone]  Fax: [phone]   [facility]  Date of Admission:  01/09/2022     Total days of antibiotics 8         ASSESSMENT:  Karina [name] CT scan shows interval improvement of the abscess in the region of the angle of the mandible with continued myositis. Will continue to need prolonged course of 6 weeks of ertapenem and follow up with oral surgery and ENT. End date tentatively planned for 02/22/22. OPAT orders placed and Home Health orders below. Follow up arranged in ID clinic with Dr. Daiva Eves. Remaining medical and supportive care per Primary Team. Plan discussed with Karina [name] and family at bedside.   PLAN:  Continue ertapenem through 02/22/22.  OPAT/Home Health orders placed.  Follow up with ENT and Oral Surgery  ID follow up with Dr. Daiva Eves. Remaining medical and supportive care per primary team  ID will sign off. Please re-consult if needed.   Diagnosis: Mandibular osteomyelitis and abscess  Culture Result: Enterobacter and actinomyces   No Known Allergies  OPAT Orders Discharge antibiotics to be given via PICC  line Discharge antibiotics: Ertapenem  Per pharmacy protocol  Duration: 6 End Date: 02/20/22  [facility] Care Per Protocol:  Home health RN for IV administration and teaching; PICC line care and labs.    Labs weekly while on IV antibiotics: _X_ CBC with differential _X_ BMP __ CMP _X_ CRP and ESR every 2 weeks  __ Vancomycin trough __ CK  _X_ Please pull PIC at completion of IV antibiotics __ Please leave PIC in place until doctor has seen patient or been notified  Fax weekly labs to [phone]  Clinic Follow Up Appt:  01/29/22 at 11:00 am with Dr. Daiva Eves   Principal Problem:   Osteomyelitis of mandible Active Problems:   Hyperlipidemia   Chronic anemia   HTN (hypertension)   Depression with anxiety   SVT (supraventricular tachycardia) (HCC)   Sinus tachycardia   Severe sepsis (HCC)   Abscess of jaw, left   Cellulitis of left jaw   Hypothyroidism   AKI (acute kidney injury) (HCC)   Nausea and vomiting   Hypokalemia    acidophilus  2 capsule Oral TID   chlorhexidine  15 mL Mouth/Throat Q4H   Chlorhexidine Gluconate Cloth  6 each Topical Daily   enoxaparin (LOVENOX) injection  30 mg Subcutaneous QPM   levothyroxine  75 mcg Oral Q0600   metoprolol succinate  25 mg Oral BID   pantoprazole  40 mg Oral Daily   simvastatin  20 mg Oral QHS   sodium chloride flush  10-40 mL Intracatheter Q12H    SUBJECTIVE:  Afebrile overnight with no acute events. No new concerns/complaints. Family at bedside.  No  Known Allergies   Review of Systems: Review of Systems  Constitutional:  Negative for chills, fever and weight loss.  Respiratory:  Negative for cough, shortness of breath and wheezing.   Cardiovascular:  Negative for chest pain and leg swelling.  Gastrointestinal:  Negative for abdominal pain, constipation, diarrhea, nausea and vomiting.  Skin:  Negative for rash.     OBJECTIVE: Vitals:   01/15/22 2048 01/15/22 2339 01/16/22 0420 01/16/22 0911  BP: (!)  144/66 (!) 147/74 (!) 174/72 (!) 142/71  Pulse: 90 82 80 100  Resp: 17 17 20 20   Temp: 98.6 F (37 C) 97.9 F (36.6 C) 98 F (36.7 C) 97.6 F (36.4 C)  TempSrc: Oral Oral Oral Oral  SpO2: 94% 93% 93% 97%  Weight:      Height:       Body mass index is 21.43 kg/m.  Physical Exam Constitutional:      General: She is not in acute distress.    Appearance: She is well-developed.     Comments: Hard of hearing.   Cardiovascular:     Rate and Rhythm: Normal rate and regular rhythm.     Heart sounds: Normal heart sounds.  Pulmonary:     Effort: Pulmonary effort is normal.     Breath sounds: Normal breath sounds.  Skin:    General: Skin is warm and dry.  Neurological:     Mental Status: She is alert and oriented to person, place, and time.  Psychiatric:        Behavior: Behavior normal.        Thought Content: Thought content normal.        Judgment: Judgment normal.    Lab Results Lab Results  Component Value Date   WBC 12.6 (H) 01/16/2022   HGB 9.5 (L) 01/16/2022   HCT 29.8 (L) 01/16/2022   MCV 89.2 01/16/2022   PLT 292 01/16/2022    Lab Results  Component Value Date   CREATININE 1.08 (H) 01/15/2022   BUN 20 01/14/2022   NA 138 01/14/2022   K 3.9 01/14/2022   CL 101 01/14/2022   CO2 30 01/14/2022    Lab Results  Component Value Date   ALT 19 01/09/2022   AST 26 01/09/2022   ALKPHOS 91 01/09/2022   BILITOT 0.4 01/09/2022     Microbiology: Recent Results (from the past 240 hour(s))  Resp Panel by RT-PCR (Flu A&B, Covid)     Status: None   Collection Time: 01/09/22  1:33 PM   Specimen: Nasopharyngeal(NP) swabs in vial transport medium  Result Value Ref Range Status   SARS Coronavirus 2 by RT PCR NEGATIVE NEGATIVE Final    Comment: (NOTE) SARS-CoV-2 target nucleic acids are NOT DETECTED.  The SARS-CoV-2 RNA is generally detectable in upper respiratory specimens during the acute phase of infection. The lowest concentration of SARS-CoV-2 viral copies this  assay can detect is 138 copies/mL. A negative result does not preclude SARS-Cov-2 infection and should not be used as the sole basis for treatment or other patient management decisions. A negative result may occur with  improper specimen collection/handling, submission of specimen other than nasopharyngeal swab, presence of viral mutation(s) within the areas targeted by this assay, and inadequate number of viral copies(<138 copies/mL). A negative result must be combined with clinical observations, patient history, and epidemiological information. The expected result is Negative.  Fact Sheet for Patients:  BloggerCourse.com  Fact Sheet for Healthcare Providers:  SeriousBroker.it  This test is no t yet approved  or cleared by the [place] and  has been authorized for detection and/or diagnosis of SARS-CoV-2 by FDA under an Emergency Use Authorization (EUA). This EUA will remain  in effect (meaning this test can be used) for the duration of the COVID-19 declaration under Section 564(b)(1) of the Act, 21 U.S.C.section 360bbb-3(b)(1), unless the authorization is terminated  or revoked sooner.       Influenza A by PCR NEGATIVE NEGATIVE Final   Influenza B by PCR NEGATIVE NEGATIVE Final    Comment: (NOTE) The Xpert Xpress SARS-CoV-2/FLU/RSV plus assay is intended as an aid in the diagnosis of influenza from Nasopharyngeal swab specimens and should not be used as a sole basis for treatment. Nasal washings and aspirates are unacceptable for Xpert Xpress SARS-CoV-2/FLU/RSV testing.  Fact Sheet for Patients: BloggerCourse.com  Fact Sheet for Healthcare Providers: SeriousBroker.it  This test is not yet approved or cleared by the [place] FDA and has been authorized for detection and/or diagnosis of SARS-CoV-2 by FDA under an Emergency Use Authorization (EUA). This EUA will  remain in effect (meaning this test can be used) for the duration of the COVID-19 declaration under Section 564(b)(1) of the Act, 21 U.S.C. section 360bbb-3(b)(1), unless the authorization is terminated or revoked.  Performed at [facility], [address], [place], [place] [address]   Blood Culture (routine x 2)     Status: None   Collection Time: 01/09/22  1:44 PM   Specimen: BLOOD  Result Value Ref Range Status   Specimen Description   Final    BLOOD RIGHT ANTECUBITAL Performed at [facility], [address] [place], [place] [address]    Special Requests   Final    BOTTLES DRAWN AEROBIC AND ANAEROBIC Blood Culture adequate volume Performed at [facility], [name], [place], [place] [address]    Culture   Final    NO GROWTH 5 DAYS Performed at [facility], [address], [place], [place] [address]    Report Status 01/14/2022 FINAL  Final  Blood Culture (routine x 2)     Status: None   Collection Time: 01/09/22  6:00 PM   Specimen: BLOOD  Result Value Ref Range Status   Specimen Description   Final    BLOOD LEFT ANTECUBITAL Performed at [facility], [address] [place], [place] [address]    Special Requests   Final    BOTTLES DRAWN AEROBIC AND ANAEROBIC Blood Culture adequate volume Performed at [facility], [address], [place], [place] [address]    Culture   Final    NO GROWTH 5 DAYS Performed at [facility], [address], [place], [place] [address]    Report Status 01/14/2022 FINAL  Final  Urine Culture     Status: Abnormal   Collection Time: 01/09/22  6:00 PM   Specimen: In/Out Cath Urine  Result Value Ref Range Status   Specimen Description   Final    IN/OUT CATH URINE Performed at [facility], [address] [place], [place] [address]    Special Requests   Final    NONE Performed at [facility], [address], [place], [place] [address]    Culture 5,000 COLONIES/mL STAPHYLOCOCCUS EPIDERMIDIS (A)  Final   Report Status 01/11/2022 FINAL  Final   Organism ID, Bacteria STAPHYLOCOCCUS EPIDERMIDIS (A)  Final      Susceptibility   Staphylococcus epidermidis -  MIC*    CIPROFLOXACIN <=0.5 SENSITIVE Sensitive     GENTAMICIN <=0.5 SENSITIVE Sensitive     NITROFURANTOIN <=16 SENSITIVE Sensitive     OXACILLIN >=4 RESISTANT Resistant     TETRACYCLINE >=16 RESISTANT Resistant     VANCOMYCIN 1 SENSITIVE Sensitive     TRIMETH/SULFA <=10 SENSITIVE Sensitive     CLINDAMYCIN 2 INTERMEDIATE Intermediate     RIFAMPIN 2 INTERMEDIATE Intermediate     Inducible Clindamycin NEGATIVE Sensitive     * 5,000 COLONIES/mL STAPHYLOCOCCUS EPIDERMIDIS  Aerobic/Anaerobic Culture w Gram Stain (surgical/deep wound)     Status: None   Collection Time: 01/09/22  9:11 PM   Specimen: PATH Bone biopsy; Tissue  Result Value Ref Range Status   Specimen Description ABSCESS  Final   Special Requests PERIMANDIBULAR  Final   Gram Stain   Final    FEW WBC PRESENT,BOTH PMN AND MONONUCLEAR FEW GRAM POSITIVE COCCI IN CHAINS    Culture   Final    FEW ENTEROBACTER CLOACAE FEW ACTINOMYCES NAESLUNDII Standardized susceptibility testing for this organism is not available. NO ANAEROBES ISOLATED Performed at [facility], [address], [place], [place] [address]    Report Status 01/14/2022 FINAL  Final   Organism ID, Bacteria ENTEROBACTER CLOACAE  Final      Susceptibility   Enterobacter cloacae - MIC*    CEFAZOLIN >=64 RESISTANT Resistant     CEFEPIME <=0.12 SENSITIVE Sensitive     CEFTAZIDIME <=1 SENSITIVE Sensitive     CIPROFLOXACIN <=0.25 SENSITIVE Sensitive     GENTAMICIN <=1 SENSITIVE Sensitive     IMIPENEM 0.5 SENSITIVE Sensitive     TRIMETH/SULFA <=20 SENSITIVE Sensitive     PIP/TAZO <=4 SENSITIVE Sensitive     * FEW ENTEROBACTER CLOACAE   Imaging CT SOFT TISSUE NECK W CONTRAST  Result  Date: 01/15/2022 CLINICAL DATA:  Soft tissue swelling left neck. Postop incision and drainage. EXAM: CT NECK WITH CONTRAST TECHNIQUE: Multidetector CT imaging of the neck was performed using the standard protocol following the bolus administration of intravenous contrast. RADIATION DOSE REDUCTION: This exam was performed according to the departmental dose-optimization program which includes automated exposure control, adjustment of the mA and/or kV according to patient size and/or use of iterative reconstruction technique. CONTRAST:  75mL OMNIPAQUE IOHEXOL 300 MG/ML  SOLN COMPARISON:  CT neck 01/09/2022 FINDINGS: Pharynx and larynx: Normal. No mass or swelling. Salivary glands: Left submandibular gland displaced inferiorly due to soft tissue abscess just above the gland. Otherwise left submandibular gland normal. Normal right submandibular gland. Parotid normal bilaterally. Thyroid: Small thyroid without focal lesion Lymph nodes: 10 mm left level 2 lymph node unchanged. No new or necrotic adenopathy Vascular: Normal vascular enhancement. Limited intracranial: Negative Visualized orbits: Negative Mastoids and visualized paranasal sinuses: Paranasal sinuses clear. Mastoid and middle ear clear bilaterally Skeleton: No acute skeletal abnormality. No evidence of bone destruction in the mandible on the left. Upper chest: Apical pleural and parenchymal scarring bilaterally Other: Rim enhancing fluid collection around the angle the mandible on the left. This shows progressive fluid density and surrounding enhancement. The abscess extends both medial and lateral to the ramus of the mandible. Fluid collection now measures approximately 2.6 x 1.9 x 2.8 cm and is slightly smaller. Gas bubbles present within the abscess presumably related to recent surgical drainage. Surrounding hyperenhancement and inflammation and enlargement of the left masseter muscle again noted. Soft tissue edema in the left neck has improved overall  compatible with improving cellulitis. IMPRESSION: Abscess in the  region of the angle the mandible on the left appears smaller compared to the prior study. Myositis in the left pterygoid and masseter muscle remains. Overlying soft tissue edema has improved. Electronically Signed   By: Marlan Palau M.D.   On: 01/15/2022 14:14   ECHOCARDIOGRAM COMPLETE  Result Date: 01/15/2022    ECHOCARDIOGRAM REPORT   Patient Name:   Karina [name] Date of Exam: 01/15/2022 Medical Rec #:  [id number]       Height:       63.0 in Accession #:    [id number]      Weight:       121.0 lb Date of Birth:  Jun 06, 1933      BSA:          1.562 m Patient Age:    88 years        BP:           163/76 mmHg Patient Gender: F               HR:           95 bpm. Exam Location:  Inpatient Procedure: 2D Echo, Cardiac Doppler and Color Doppler Indications:    Murmur  History:        Patient has prior history of Echocardiogram examinations.                 Arrythmias:Tachycardia; Risk Factors:Hypertension and                 Dyslipidemia.  Sonographer:    Neomia Dear RDCS Referring Phys: [id number] [facility]  Sonographer Comments: Technically difficult study due to poor echo windows. Image acquisition challenging due to breast implants. IMPRESSIONS  1. Left ventricular ejection [name], by estimation, is 60 to 65%. The left ventricle has normal function. The left ventricle has no regional wall motion abnormalities. Left ventricular diastolic parameters are consistent with Grade I diastolic dysfunction (impaired relaxation). Elevated left ventricular end-diastolic pressure. The E/e' is 23.  2. Right ventricular systolic function is normal. The right ventricular size is normal. There is normal pulmonary artery systolic pressure. The estimated right ventricular systolic pressure is 30.5 mmHg.  3. The mitral valve is abnormal. Trivial mitral valve regurgitation.  4. The aortic valve is tricuspid. Aortic valve regurgitation is not visualized. Aortic valve  sclerosis is present, with no evidence of aortic valve stenosis. Aortic valve mean gradient measures 4.0 mmHg.  5. The inferior vena cava is normal in size with greater than 50% respiratory variability, suggesting right atrial pressure of 3 mmHg. Comparison(s): No prior Echocardiogram. FINDINGS  Left Ventricle: Left ventricular ejection [name], by estimation, is 60 to 65%. The left ventricle has normal function. The left ventricle has no regional wall motion abnormalities. The left ventricular internal cavity size was normal in size. There is  no left ventricular hypertrophy. Left ventricular diastolic parameters are consistent with Grade I diastolic dysfunction (impaired relaxation). Elevated left ventricular end-diastolic pressure. The E/e' is 1. Right Ventricle: The right ventricular size is normal. No increase in right ventricular wall thickness. Right ventricular systolic function is normal. There is normal pulmonary artery systolic pressure. The tricuspid regurgitant velocity is 2.62 m/s, and  with an assumed right atrial pressure of 3 mmHg, the estimated right ventricular systolic pressure is 30.5 mmHg. Left Atrium: Left atrial size was normal in size. Right Atrium: Right atrial size was normal in size. Pericardium: There is no evidence of pericardial effusion. Mitral Valve: The mitral valve is abnormal. Mild mitral  annular calcification. Trivial mitral valve regurgitation. MV peak gradient, 6.6 mmHg. The mean mitral valve gradient is 3.0 mmHg. Tricuspid Valve: The tricuspid valve is grossly normal. Tricuspid valve regurgitation is trivial. Aortic Valve: The aortic valve is tricuspid. Aortic valve regurgitation is not visualized. Aortic valve sclerosis is present, with no evidence of aortic valve stenosis. Aortic valve mean gradient measures 4.0 mmHg. Aortic valve peak gradient measures 7.3  mmHg. Aortic valve area, by VTI measures 1.96 cm. Pulmonic Valve: The pulmonic valve was normal in structure.  Pulmonic valve regurgitation is not visualized. Aorta: The aortic root and ascending aorta are structurally normal, with no evidence of dilitation. Venous: The inferior vena cava is normal in size with greater than 50% respiratory variability, suggesting right atrial pressure of 3 mmHg. IAS/Shunts: No atrial level shunt detected by color flow Doppler.  LEFT VENTRICLE PLAX 2D LVIDd:         3.40 cm     Diastology LVIDs:         2.70 cm     LV e' medial:    2.69 cm/s LV PW:         0.80 cm     LV E/e' medial:  28.5 LV IVS:        0.90 cm     LV e' lateral:   4.22 cm/s LVOT diam:     1.80 cm     LV E/e' lateral: 18.2 LV SV:         41 LV SV Index:   26 LVOT Area:     2.54 cm  LV Volumes (MOD) LV vol d, MOD A2C: 47.4 ml LV vol d, MOD A4C: 52.1 ml LV vol s, MOD A2C: 25.5 ml LV vol s, MOD A4C: 21.7 ml LV SV MOD A2C:     21.9 ml LV SV MOD A4C:     52.1 ml LV SV MOD BP:      25.6 ml RIGHT VENTRICLE RV Basal diam:  3.60 cm RV Mid diam:    3.00 cm RV S prime:     17.55 cm/s TAPSE (M-mode): 2.6 cm LEFT ATRIUM             Index LA Vol (A2C):   43.6 ml 27.92 ml/m LA Vol (A4C):   30.5 ml 19.53 ml/m LA Biplane Vol: 36.4 ml 23.31 ml/m  AORTIC VALVE AV Area (Vmax):    1.74 cm AV Area (Vmean):   1.71 cm AV Area (VTI):     1.96 cm AV Vmax:           134.67 cm/s AV Vmean:          90.500 cm/s AV VTI:            0.208 m AV Peak Grad:      7.3 mmHg AV Mean Grad:      4.0 mmHg LVOT Vmax:         92.00 cm/s LVOT Vmean:        60.900 cm/s LVOT VTI:          0.160 m LVOT/AV VTI ratio: 0.77  AORTA Ao Root diam: 2.70 cm Ao Asc diam:  3.60 cm MITRAL VALVE                TRICUSPID VALVE MV Area (PHT): 3.91 cm     TR Peak grad:   27.5 mmHg MV Area VTI:   1.75 cm     TR Vmax:  262.00 cm/s MV Peak grad:  6.6 mmHg MV Mean grad:  3.0 mmHg     SHUNTS MV Vmax:       1.28 m/s     Systemic VTI:  0.16 m MV Vmean:      83.0 cm/s    Systemic Diam: 1.80 cm MV Decel Time: 194 msec MV E velocity: 76.60 cm/s MV A velocity: 114.00 cm/s MV E/A  ratio:  0.67 Zoila Shutter MD Electronically signed by Zoila Shutter MD Signature Date/Time: 01/15/2022/4:42:56 PM    Final     Marcos Eke, NP [facility]  01/16/2022  9:53 AM

## 2022-01-16 NOTE — Discharge Summary (Signed)
Physician Discharge Summary   Patient: Karina [name] MRN: [id number] DOB: Jul 23, 1933  Admit date:     01/09/2022  Discharge date: 01/16/22  Discharge Physician: Raiford Noble, DO   PCP: Burnard Bunting, MD   Recommendations at discharge:   Follow-up with PCP within 1 to 2 weeks and repeat CBC, CMP, mag, Phos Follow-up with ENT Dr. Edwyna Ready within 1 to 2 weeks Follow-up with oral surgery in outpatient setting With infectious diseases an appointment has been scheduled with Dr. Tommy Medal on 02/04/2022 and continue ertapenem through 02/22/2022  Discharge Diagnoses: Principal Problem:   Osteomyelitis of mandible Active Problems:   Hyperlipidemia   Chronic anemia   HTN (hypertension)   Depression with anxiety   SVT (supraventricular tachycardia) (HCC)   Sinus tachycardia   Severe sepsis (HCC)   Abscess of jaw, left   Cellulitis of left jaw   Hypothyroidism   AKI (acute kidney injury) ([place])   Nausea and vomiting   Hypokalemia  Resolved Problems:   * No resolved hospital problems. *  Hospital Course: Karina [name] is a 88 y.o. female with a history of anemia, hypothyroidism, SVT/sinus tachycardia, hypertension, anxiety. Patient presented secondary to falling this morning. She reports failing this morning after feeling lightheaded. No associated dyspnea, chest pain or palpitations. Patient unable to provide much more history and further history provided by niece. This morning, patient's sister called and paitent told her sister than she fell on the floor. Patient sister called the patient's son and called 42. Patient does report some left jaw/neck swelling that started three days prior to admission. Patient does report taking Tylenol for some pain. She reports some nausea without emesis.  Further work-up revealed that she had a left perimandibular abscess and osteomyelitis.  She is status post I&D on 01/09/2022 which grew out Enterobacter cloacae and actinomyces naeslundii.  Her urine  cultures were positive for 5000 colonies of MRSE this is likely a colonization.  She was initiated on vancomycin and ceftriaxone and then changed to ampicillin sulbactam and then ertapenem.  She developed right arm PICC placed.  She steadily improved and received Decadron 8 mg every 8 for 3 doses.  She is recommended to apply warm compresses to the left neck and mandible with gentle massage.  Yesterday her WBC slightly worsened so ID recommended repeating a CT soft tissue of the neck to make sure no worsening findings but did show that she had a smaller abscess.  They recommended continuing IV ertapenem until 02/22/2022 with home health and outpatient follow-up.  She was deemed stable for discharge at this time.  Assessment and Plan: * Osteomyelitis of mandible - We will complete 6 weeks of ertapenem and follow-up with oral surgery and ENT as well as ID in the clinic with Dr. Drucilla Schmidt  She will need weekly labs faxed to the ID clinic at [phone]   Hypokalemia Her potassium was 3.0 and on admission and improved to 3.9 on last check  Nausea and vomiting -Continue Zofran IV prn -Improved and she is stable  AKI (acute kidney injury) ([place]) Creatinine of 1.3 on admission. Baseline creatinine is about 1, however last labs from years prior. Patient given IV fluids for sepsis. -Continue IV fluids and last check BUN/creatinine was 20/1.11 and stable -Repeat CMP within 1 week  Hypothyroidism Patient is on Synthroid 75 mcg daily as an outpatient. Patient with a history of SVT and sinus tachycardia and is currently tachycardic. Since tachycardia likely secondary to acute illness/lack of patient taking medication  this morning, will hold off on TSH testing. -Hold Synthroid while NPO; Synthroid 37.5 mcg IV if still NPO in 2-3 days -Resume Synthroid and follow-up in 4 to 6 weeks  Cellulitis of left jaw See problem, Abscess of jaw, left  Abscess of jaw, left -Patient is edentulous. 4 cm abscess noted in  right mandibular/submandibular space. ENT, Dr. Fredric Dine, consulted by ED with recommendation for transfer to [facility] with plan for probable I&D. Patient started given empiric Vancomycin and Ceftriaxone in the ED. -Unasyn IV was then subsequently initiated and then she went for I&D. -ENT recommended ID consult and she was changed to ertapenem based on culture results  Severe sepsis [facility]) Patient with leukocytosis, tachycardia and tachypnea. Urinalysis unremarkable. Blood and urine cultures obtained on admission. Patient given 2 L of normal saline in the ED. Vancomycin and Ceftriaxone given empirically in the ED. changed to ampicillin sulbactam and then IV ertapenem per ID recommendations given the cultures that grew with Enterobacter and actinomyces -Antibiotics, see problem, Abscess of jaw, left -Follow-up urine/blood culture data -Continue IV fluids while hospitalized and stopped -ENT did I&D and ID was consulted and recommending outpatient follow-up  Sinus tachycardia Patient with a history of sinus tachycardia in addition to SVT. Seen by cardiology as an outpatient. On metoprolol and diltiazem. Complicated by sepsis. -Metoprolol 2.5 mg IV q6 hours while NPO  SVT (supraventricular tachycardia) ([place]) History. No current evidence of SVT. See problem, Sinus tachycardia  Depression with anxiety Patient is on Xanax prn as an outpatient. Held on admission but has been resumed  HTN (hypertension) Patient is on diltiazem and metoprolol as an outpatient which are held while NPO but will be resumed at the time of discharge -Metoprolol 2.5 mg IV q6 hours -Hydralazine 10 mg IV PRN  Chronic anemia Baseline hemoglobin appears to be around 11. Currently dropped slightly and hemoglobin/hematocrit is now stable at 9.5/29.8 -Repeat CBC within 1 week  Hyperlipidemia Patient is on simvastatin as an outpatient. Simvastatin held on admission but will be resumed as an outpatient.  Pain  control - [employer] Controlled Substance Reporting System database was reviewed. and patient was instructed, not to drive, operate heavy machinery, perform activities at heights, swimming or participation in water activities or provide baby-sitting services while on Pain, Sleep and Anxiety Medications; until their outpatient Physician has advised to do so again. Also recommended to not to take more than prescribed Pain, Sleep and Anxiety Medications.  Consultants: ENT, ID Procedures performed: I and D   Disposition: Home health Diet recommendation:  Cardiac diet DISCHARGE MEDICATION: Allergies as of 01/16/2022   No Known Allergies      Medication List     STOP taking these medications    meloxicam 15 MG tablet Commonly known as: MOBIC       TAKE these medications    acetaminophen 325 MG tablet Commonly known as: TYLENOL Take 2 tablets (650 mg total) by mouth every 6 (six) hours as needed for mild pain (or Fever >/= 101).   Alive Womens 50+ Tabs Take 1 tablet by mouth daily.   ALPRAZolam 0.5 MG tablet Commonly known as: XANAX Take 1 tablet (0.5 mg total) by mouth as needed for sleep or anxiety. 1/2 to 2 tabs po bid prn for anxiety, insomnia What changed:  how much to take when to take this additional instructions   aspirin 81 MG tablet Take 81 mg by mouth daily.   Co Q-10 50 MG Caps Take 1 capsule by mouth  daily.   diltiazem 240 MG 24 hr capsule Commonly known as: CARDIZEM CD TAKE 1 CAPSULE (240 MG TOTAL) BY MOUTH DAILY.   diphenhydramine-acetaminophen 25-500 MG Tabs tablet Commonly known as: TYLENOL PM Take 1 tablet by mouth at bedtime as needed (pain/sleep.).   ertapenem  IVPB Commonly known as: INVANZ Inject 1 g into the vein daily. Indication: mandibular osteomyelitis First Dose: Yes Last Day of Therapy:  02/22/2022 Labs - Once weekly:  CBC/D and BMP, Labs - Every other week:  ESR and CRP Method of administration: Mini-Bag Plus / Gravity Method of  administration may be changed at the discretion of home infusion pharmacist based upon assessment of the patient and/or caregiver's ability to self-administer the medication ordered.   levothyroxine 75 MCG tablet Commonly known as: SYNTHROID TAKE 1 TABLET EVERY DAY ON AN EMPTY STOMACH   metoprolol succinate 50 MG 24 hr tablet Commonly known as: TOPROL-XL Take 1 tablet by mouth 2 (two) times daily.   ondansetron 4 MG tablet Commonly known as: ZOFRAN Take 1 tablet (4 mg total) by mouth every 6 (six) hours as needed for nausea.   pantoprazole 40 MG tablet Commonly known as: PROTONIX Take 40 mg by mouth daily.   saccharomyces boulardii 250 MG capsule Commonly known as: FLORASTOR Take 1 capsule (250 mg total) by mouth 3 (three) times daily.   simvastatin 20 MG tablet Commonly known as: ZOCOR Take 1 tablet by mouth daily.   traMADol 50 MG tablet Commonly known as: Ultram Take 1 tablet (50 mg total) by mouth every 12 (twelve) hours as needed.   VITAMIN D-3 PO Take 5,000 Units by mouth in the morning and at bedtime.               Durable Medical Equipment  (From admission, onward)           Start     Ordered   01/12/22 1505  For home use only DME Walker rolling  Once       Question Answer Comment  Walker: With [place] Wheels   Patient needs a walker to treat with the following condition Weakness      01/12/22 1504              Discharge Care Instructions  (From admission, onward)           Start     Ordered   01/16/22 0000  Change dressing on IV access line weekly and PRN  (Home infusion instructions - [facility] )        01/16/22 1103            Follow-up Information     Skotnicki, Meghan A, DO Follow up in 3 week(s).   Specialty: Otolaryngology Contact information: [address] [place] [address] [phone]         [facility] Follow up.   Specialty: Home Health Services Why:  Registered Nurse, Physical Therapy-office to call with visit times. Contact information: [place] [address] [phone]         [facility] Follow up.   Why: [facility]        Frederik Schmidt, MD Follow up in 1 week(s).   Specialty: Oral Surgery Contact information: [address] [place] [address] [phone]                Discharge Exam: Danley Danker Weights   01/09/22 1326  Weight: 54.9 kg  Vitals:   01/16/22 0911 01/16/22 1109  BP: (!) 142/71 103/84  Pulse: 100 96  Resp: 20 20  Temp: 97.6 F (36.4 C) 98.4 F (36.9 C)  SpO2: 97% 95%   Examination: Physical Exam:  Constitutional: Thin elderly Caucasian female currently no acute distress Neck: Left side of neck and jaws improved and not swollen Respiratory: Diminished to auscultation bilaterally, no wheezing, rales, rhonchi or crackles. Normal respiratory effort and patient is not tachypenic. No accessory muscle use.  Cardiovascular: RRR, no murmurs / rubs / gallops. S1 and S2 auscultated.  No appreciable extremity edema Abdomen: Soft, non-tender, non-distended.  Bowel sounds positive.  GU: Deferred. Musculoskeletal: No clubbing / cyanosis of digits/nails. No joint deformity upper and lower extremities.   Condition at discharge: stable  The results of significant diagnostics from this hospitalization (including imaging, microbiology, ancillary and laboratory) are listed below for reference.   Imaging Studies: CT SOFT TISSUE NECK W CONTRAST  Result Date: 01/15/2022 CLINICAL DATA:  Soft tissue swelling left neck. Postop incision and drainage. EXAM: CT NECK WITH CONTRAST TECHNIQUE: Multidetector CT imaging of the neck was performed using the standard protocol following the bolus administration of intravenous contrast. RADIATION DOSE REDUCTION: This exam was performed according to the departmental dose-optimization program which includes automated exposure control,  adjustment of the mA and/or kV according to patient size and/or use of iterative reconstruction technique. CONTRAST:  60m OMNIPAQUE IOHEXOL 300 MG/ML  SOLN COMPARISON:  CT neck 01/09/2022 FINDINGS: Pharynx and larynx: Normal. No mass or swelling. Salivary glands: Left submandibular gland displaced inferiorly due to soft tissue abscess just above the gland. Otherwise left submandibular gland normal. Normal right submandibular gland. Parotid normal bilaterally. Thyroid: Small thyroid without focal lesion Lymph nodes: 10 mm left level 2 lymph node unchanged. No new or necrotic adenopathy Vascular: Normal vascular enhancement. Limited intracranial: Negative Visualized orbits: Negative Mastoids and visualized paranasal sinuses: Paranasal sinuses clear. Mastoid and middle ear clear bilaterally Skeleton: No acute skeletal abnormality. No evidence of bone destruction in the mandible on the left. Upper chest: Apical pleural and parenchymal scarring bilaterally Other: Rim enhancing fluid collection around the angle the mandible on the left. This shows progressive fluid density and surrounding enhancement. The abscess extends both medial and lateral to the ramus of the mandible. Fluid collection now measures approximately 2.6 x 1.9 x 2.8 cm and is slightly smaller. Gas bubbles present within the abscess presumably related to recent surgical drainage. Surrounding hyperenhancement and inflammation and enlargement of the left masseter muscle again noted. Soft tissue edema in the left neck has improved overall compatible with improving cellulitis. IMPRESSION: Abscess in the region of the angle the mandible on the left appears smaller compared to the prior study. Myositis in the left pterygoid and masseter muscle remains. Overlying soft tissue edema has improved. Electronically Signed   By: CFranchot GalloM.D.   On: 01/15/2022 14:14   CT Soft Tissue Neck W Contrast  Result Date: 01/09/2022 CLINICAL DATA:  Soft tissue swelling,  infection suspected, neck xray done. Generalized weakness. Left jaw infection with redness and swelling extending into the neck. EXAM: CT NECK WITH CONTRAST TECHNIQUE: Multidetector CT imaging of the neck was performed using the standard protocol following the bolus administration of intravenous contrast. RADIATION DOSE REDUCTION: This exam was performed according to the departmental dose-optimization program which includes automated exposure control, adjustment of the mA and/or kV according to patient size and/or use of iterative reconstruction technique. CONTRAST:  65mOMNIPAQUE IOHEXOL 300 MG/ML  SOLN  COMPARISON:  None. FINDINGS: Pharynx and larynx: No mass. Patent airway. No retropharyngeal fluid collection. Salivary glands: Inflammatory changes are present in the left masticator, buccal, and submandibular spaces as well as within the overlying subcutaneous soft tissues of the face and upper neck. A low-density collection at the angle of the mandible on the left measures approximately 3.6 x 3.0 x 4.0 cm, extends inferiorly into the posterior aspect of the submandibular space, and extends superiorly in the masticator space with involvement of the masticator and pterygoid musculature. The mandible appears intact. The collection and regional inflammatory changes result in inferior displacement of the left submandibular gland which is mildly enlarged compared to the right but is more likely to be secondarily involved rather than reflecting the primary source of this inflammation. The right submandibular gland and both parotid glands are unremarkable. Thyroid: Diffusely small and otherwise grossly unremarkable within limitations of motion artifact. Lymph nodes: Borderline enlarged left level IIa lymph node measuring 1 cm in short axis, likely reactive. Vascular: Major vascular structures of the neck are grossly patent. Mild carotid atherosclerosis. Limited intracranial: Unremarkable. Visualized orbits: Unremarkable.  Mastoids and visualized paranasal sinuses: Clear. Skeleton: Edentulous aside from an unerupted/possibly supernumerary tooth in the left maxillary incisor region. Upper thoracic levoscoliosis and cervical dextroscoliosis. Upper chest: Biapical pleuroparenchymal lung scarring. Other: None. IMPRESSION: 4 cm collection at the angle of the mandible on the left consistent with abscess with extensive surrounding inflammation. Electronically Signed   By: Logan Bores M.D.   On: 01/09/2022 15:51   DG Chest Port 1 View  Result Date: 01/09/2022 CLINICAL DATA:  Sepsis. EXAM: PORTABLE CHEST 1 VIEW COMPARISON:  May 24, 2016. FINDINGS: Stable cardiomegaly. Both lungs are clear. The visualized skeletal structures are unremarkable. IMPRESSION: No active disease. Electronically Signed   By: Marijo Conception M.D.   On: 01/09/2022 15:43   ECHOCARDIOGRAM COMPLETE  Result Date: 01/15/2022    ECHOCARDIOGRAM REPORT   Patient Name:   DARLEENE CUMPIAN Date of Exam: 01/15/2022 Medical Rec #:  [id number]       Height:       63.0 in Accession #:    [id number]      Weight:       121.0 lb Date of Birth:  11/08/1933      BSA:          1.562 m Patient Age:    9 years        BP:           163/76 mmHg Patient Gender: F               HR:           95 bpm. Exam Location:  Inpatient Procedure: 2D Echo, Cardiac Doppler and Color Doppler Indications:    Murmur  History:        Patient has prior history of Echocardiogram examinations.                 Arrythmias:Tachycardia; Risk Factors:Hypertension and                 Dyslipidemia.  Sonographer:    Luisa Hart RDCS Referring Phys: [id number] [facility]  Sonographer Comments: Technically difficult study due to poor echo windows. Image acquisition challenging due to breast implants. IMPRESSIONS  1. Left ventricular ejection fraction, by estimation, is 60 to 65%. The left ventricle has normal function. The left ventricle has no regional wall motion abnormalities. Left ventricular diastolic parameters are  consistent with Grade  I diastolic dysfunction (impaired relaxation). Elevated left ventricular end-diastolic pressure. The E/e' is 23.  2. Right ventricular systolic function is normal. The right ventricular size is normal. There is normal pulmonary artery systolic pressure. The estimated right ventricular systolic pressure is 33.2 mmHg.  3. The mitral valve is abnormal. Trivial mitral valve regurgitation.  4. The aortic valve is tricuspid. Aortic valve regurgitation is not visualized. Aortic valve sclerosis is present, with no evidence of aortic valve stenosis. Aortic valve mean gradient measures 4.0 mmHg.  5. The inferior vena cava is normal in size with greater than 50% respiratory variability, suggesting right atrial pressure of 3 mmHg. Comparison(s): No prior Echocardiogram. FINDINGS  Left Ventricle: Left ventricular ejection fraction, by estimation, is 60 to 65%. The left ventricle has normal function. The left ventricle has no regional wall motion abnormalities. The left ventricular internal cavity size was normal in size. There is  no left ventricular hypertrophy. Left ventricular diastolic parameters are consistent with Grade I diastolic dysfunction (impaired relaxation). Elevated left ventricular end-diastolic pressure. The E/e' is 25. Right Ventricle: The right ventricular size is normal. No increase in right ventricular wall thickness. Right ventricular systolic function is normal. There is normal pulmonary artery systolic pressure. The tricuspid regurgitant velocity is 2.62 m/s, and  with an assumed right atrial pressure of 3 mmHg, the estimated right ventricular systolic pressure is 95.1 mmHg. Left Atrium: Left atrial size was normal in size. Right Atrium: Right atrial size was normal in size. Pericardium: There is no evidence of pericardial effusion. Mitral Valve: The mitral valve is abnormal. Mild mitral annular calcification. Trivial mitral valve regurgitation. MV peak gradient, 6.6 mmHg. The mean  mitral valve gradient is 3.0 mmHg. Tricuspid Valve: The tricuspid valve is grossly normal. Tricuspid valve regurgitation is trivial. Aortic Valve: The aortic valve is tricuspid. Aortic valve regurgitation is not visualized. Aortic valve sclerosis is present, with no evidence of aortic valve stenosis. Aortic valve mean gradient measures 4.0 mmHg. Aortic valve peak gradient measures 7.3  mmHg. Aortic valve area, by VTI measures 1.96 cm. Pulmonic Valve: The pulmonic valve was normal in structure. Pulmonic valve regurgitation is not visualized. Aorta: The aortic root and ascending aorta are structurally normal, with no evidence of dilitation. Venous: The inferior vena cava is normal in size with greater than 50% respiratory variability, suggesting right atrial pressure of 3 mmHg. IAS/Shunts: No atrial level shunt detected by color flow Doppler.  LEFT VENTRICLE PLAX 2D LVIDd:         3.40 cm     Diastology LVIDs:         2.70 cm     LV e' medial:    2.69 cm/s LV PW:         0.80 cm     LV E/e' medial:  28.5 LV IVS:        0.90 cm     LV e' lateral:   4.22 cm/s LVOT diam:     1.80 cm     LV E/e' lateral: 18.2 LV SV:         41 LV SV Index:   26 LVOT Area:     2.54 cm  LV Volumes (MOD) LV vol d, MOD A2C: 47.4 ml LV vol d, MOD A4C: 52.1 ml LV vol s, MOD A2C: 25.5 ml LV vol s, MOD A4C: 21.7 ml LV SV MOD A2C:     21.9 ml LV SV MOD A4C:     52.1 ml LV SV MOD BP:  25.6 ml RIGHT VENTRICLE RV Basal diam:  3.60 cm RV Mid diam:    3.00 cm RV S prime:     17.55 cm/s TAPSE (M-mode): 2.6 cm LEFT ATRIUM             Index LA Vol (A2C):   43.6 ml 27.92 ml/m LA Vol (A4C):   30.5 ml 19.53 ml/m LA Biplane Vol: 36.4 ml 23.31 ml/m  AORTIC VALVE AV Area (Vmax):    1.74 cm AV Area (Vmean):   1.71 cm AV Area (VTI):     1.96 cm AV Vmax:           134.67 cm/s AV Vmean:          90.500 cm/s AV VTI:            0.208 m AV Peak Grad:      7.3 mmHg AV Mean Grad:      4.0 mmHg LVOT Vmax:         92.00 cm/s LVOT Vmean:        60.900 cm/s LVOT  VTI:          0.160 m LVOT/AV VTI ratio: 0.77  AORTA Ao Root diam: 2.70 cm Ao Asc diam:  3.60 cm MITRAL VALVE                TRICUSPID VALVE MV Area (PHT): 3.91 cm     TR Peak grad:   27.5 mmHg MV Area VTI:   1.75 cm     TR Vmax:        262.00 cm/s MV Peak grad:  6.6 mmHg MV Mean grad:  3.0 mmHg     SHUNTS MV Vmax:       1.28 m/s     Systemic VTI:  0.16 m MV Vmean:      83.0 cm/s    Systemic Diam: 1.80 cm MV Decel Time: 194 msec MV E velocity: 76.60 cm/s MV A velocity: 114.00 cm/s MV E/A ratio:  0.67 Lyman Bishop MD Electronically signed by Lyman Bishop MD Signature Date/Time: 01/15/2022/4:42:56 PM    Final    [place] EKG SITE RITE  Result Date: 01/12/2022 If Site Rite image not attached, placement could not be confirmed due to current cardiac rhythm.   Microbiology: Results for orders placed or performed during the hospital encounter of 01/09/22  Resp Panel by RT-PCR (Flu A&B, Covid)     Status: None   Collection Time: 01/09/22  1:33 PM   Specimen: Nasopharyngeal(NP) swabs in vial transport medium  Result Value Ref Range Status   SARS Coronavirus 2 by RT PCR NEGATIVE NEGATIVE Final    Comment: (NOTE) SARS-CoV-2 target nucleic acids are NOT DETECTED.  The SARS-CoV-2 RNA is generally detectable in upper respiratory specimens during the acute phase of infection. The lowest concentration of SARS-CoV-2 viral copies this assay can detect is 138 copies/mL. A negative result does not preclude SARS-Cov-2 infection and should not be used as the sole basis for treatment or other patient management decisions. A negative result may occur with  improper specimen collection/handling, submission of specimen other than nasopharyngeal swab, presence of viral mutation(s) within the areas targeted by this assay, and inadequate number of viral copies(<138 copies/mL). A negative result must be combined with clinical observations, patient history, and epidemiological information. The expected result is  Negative.  Fact Sheet for Patients:  EntrepreneurPulse.com.au  Fact Sheet for Healthcare Providers:  IncredibleEmployment.be  This test is no t yet approved or cleared by the [place]  States FDA and  has been authorized for detection and/or diagnosis of SARS-CoV-2 by FDA under an Emergency Use Authorization (EUA). This EUA will remain  in effect (meaning this test can be used) for the duration of the COVID-19 declaration under Section 564(b)(1) of the Act, 21 U.S.C.section 360bbb-3(b)(1), unless the authorization is terminated  or revoked sooner.       Influenza A by PCR NEGATIVE NEGATIVE Final   Influenza B by PCR NEGATIVE NEGATIVE Final    Comment: (NOTE) The Xpert Xpress SARS-CoV-2/FLU/RSV plus assay is intended as an aid in the diagnosis of influenza from Nasopharyngeal swab specimens and should not be used as a sole basis for treatment. Nasal washings and aspirates are unacceptable for Xpert Xpress SARS-CoV-2/FLU/RSV testing.  Fact Sheet for Patients: EntrepreneurPulse.com.au  Fact Sheet for Healthcare Providers: IncredibleEmployment.be  This test is not yet approved or cleared by the [place] FDA and has been authorized for detection and/or diagnosis of SARS-CoV-2 by FDA under an Emergency Use Authorization (EUA). This EUA will remain in effect (meaning this test can be used) for the duration of the COVID-19 declaration under Section 564(b)(1) of the Act, 21 U.S.C. section 360bbb-3(b)(1), unless the authorization is terminated or revoked.  Performed at [facility], [place] [address], [place], [place] [address]   Blood Culture (routine x 2)     Status: None   Collection Time: 01/09/22  1:44 PM   Specimen: BLOOD  Result Value Ref Range Status   Specimen Description   Final    BLOOD RIGHT ANTECUBITAL Performed at [facility] [address],  [place], [place] [address]    Special Requests   Final    BOTTLES DRAWN AEROBIC AND ANAEROBIC Blood Culture adequate volume Performed at [facility] [address] [place], [place] [address]    Culture   Final    NO GROWTH 5 DAYS Performed at [facility], [place] [address] [place], [place] [address]    Report Status 01/14/2022 FINAL  Final  Blood Culture (routine x 2)     Status: None   Collection Time: 01/09/22  6:00 PM   Specimen: BLOOD  Result Value Ref Range Status   Specimen Description   Final    BLOOD LEFT ANTECUBITAL Performed at [facility] [address], [place], [place] [address]    Special Requests   Final    BOTTLES DRAWN AEROBIC AND ANAEROBIC Blood Culture adequate volume Performed at [facility] [address] [place], [place] [address]    Culture   Final    NO GROWTH 5 DAYS Performed at [facility], [place] [address], [place], [place] [address]    Report Status 01/14/2022 FINAL  Final  Urine Culture     Status: Abnormal   Collection Time: 01/09/22  6:00 PM   Specimen: In/Out Cath Urine  Result Value Ref Range Status   Specimen Description   Final    IN/OUT CATH URINE Performed at [facility] [address] [place], [place] [address]    Special Requests   Final    NONE Performed at [facility], [place] [address], [place], [place] [address]    Culture 5,000 COLONIES/mL STAPHYLOCOCCUS EPIDERMIDIS (A)  Final   Report Status 01/11/2022 FINAL  Final   Organism ID, Bacteria STAPHYLOCOCCUS EPIDERMIDIS (A)  Final      Susceptibility   Staphylococcus epidermidis - MIC*    CIPROFLOXACIN <=  0.5 SENSITIVE Sensitive     GENTAMICIN <=0.5 SENSITIVE Sensitive     NITROFURANTOIN <=16 SENSITIVE Sensitive     OXACILLIN >=4 RESISTANT Resistant     TETRACYCLINE >=16 RESISTANT Resistant     VANCOMYCIN 1 SENSITIVE Sensitive     TRIMETH/SULFA <=10 SENSITIVE Sensitive      CLINDAMYCIN 2 INTERMEDIATE Intermediate     RIFAMPIN 2 INTERMEDIATE Intermediate     Inducible Clindamycin NEGATIVE Sensitive     * 5,000 COLONIES/mL STAPHYLOCOCCUS EPIDERMIDIS  Aerobic/Anaerobic Culture w Gram Stain (surgical/deep wound)     Status: None   Collection Time: 01/09/22  9:11 PM   Specimen: PATH Bone biopsy; Tissue  Result Value Ref Range Status   Specimen Description ABSCESS  Final   Special Requests PERIMANDIBULAR  Final   Gram Stain   Final    FEW WBC PRESENT,BOTH PMN AND MONONUCLEAR FEW GRAM POSITIVE COCCI IN CHAINS    Culture   Final    FEW ENTEROBACTER CLOACAE FEW ACTINOMYCES NAESLUNDII Standardized susceptibility testing for this organism is not available. NO ANAEROBES ISOLATED Performed at [facility], [place] [address] [place], [place] [address]    Report Status 01/14/2022 FINAL  Final   Organism ID, Bacteria ENTEROBACTER CLOACAE  Final      Susceptibility   Enterobacter cloacae - MIC*    CEFAZOLIN >=64 RESISTANT Resistant     CEFEPIME <=0.12 SENSITIVE Sensitive     CEFTAZIDIME <=1 SENSITIVE Sensitive     CIPROFLOXACIN <=0.25 SENSITIVE Sensitive     GENTAMICIN <=1 SENSITIVE Sensitive     IMIPENEM 0.5 SENSITIVE Sensitive     TRIMETH/SULFA <=20 SENSITIVE Sensitive     PIP/TAZO <=4 SENSITIVE Sensitive     * FEW ENTEROBACTER CLOACAE    Labs: CBC: Recent Labs  Lab 01/10/22 0129 01/11/22 0200 01/14/22 1138 01/15/22 0948 01/16/22 0744  WBC 15.1* 12.5* 16.8* 18.7* 12.6*  NEUTROABS  --   --   --   --  7.3  HGB 9.9* 9.7* 10.1* 9.9* 9.5*  HCT 30.4* 30.3* 31.1* 30.6* 29.8*  MCV 87.9 88.6 89.6 88.4 89.2  PLT 206 223 270 273 643   Basic Metabolic Panel: Recent Labs  Lab 01/10/22 0129 01/13/22 1543 01/14/22 0615 01/15/22 0826  NA 138  --  138  --   K 3.9 3.3* 3.9  --   CL 106  --  101  --   CO2 22  --  30  --   GLUCOSE 183*  --  106*  --   BUN 16  --  20  --   CREATININE 1.06*  --  1.11* 1.08*  CALCIUM 8.4*  --  8.8*  --    Liver  Function Tests: No results for input(s): AST, ALT, ALKPHOS, BILITOT, PROT, ALBUMIN in the last 168 hours.  CBG: No results for input(s): GLUCAP in the last 168 hours.   Discharge time spent: greater than 30 minutes.  Signed: Raiford Noble, DO [facility] 01/16/2022

## 2022-01-17 DIAGNOSIS — R6520 Severe sepsis without septic shock: Secondary | ICD-10-CM | POA: Diagnosis not present

## 2022-01-17 DIAGNOSIS — M272 Inflammatory conditions of jaws: Secondary | ICD-10-CM | POA: Diagnosis not present

## 2022-01-17 DIAGNOSIS — L03211 Cellulitis of face: Secondary | ICD-10-CM | POA: Diagnosis not present

## 2022-01-17 DIAGNOSIS — A419 Sepsis, unspecified organism: Secondary | ICD-10-CM | POA: Diagnosis not present

## 2022-01-21 DIAGNOSIS — A419 Sepsis, unspecified organism: Secondary | ICD-10-CM | POA: Diagnosis not present

## 2022-01-21 DIAGNOSIS — R413 Other amnesia: Secondary | ICD-10-CM | POA: Diagnosis not present

## 2022-01-21 DIAGNOSIS — M272 Inflammatory conditions of jaws: Secondary | ICD-10-CM | POA: Diagnosis not present

## 2022-01-21 DIAGNOSIS — I10 Essential (primary) hypertension: Secondary | ICD-10-CM | POA: Diagnosis not present

## 2022-01-24 DIAGNOSIS — L03211 Cellulitis of face: Secondary | ICD-10-CM | POA: Diagnosis not present

## 2022-01-24 DIAGNOSIS — R6520 Severe sepsis without septic shock: Secondary | ICD-10-CM | POA: Diagnosis not present

## 2022-01-24 DIAGNOSIS — A419 Sepsis, unspecified organism: Secondary | ICD-10-CM | POA: Diagnosis not present

## 2022-01-24 DIAGNOSIS — M272 Inflammatory conditions of jaws: Secondary | ICD-10-CM | POA: Diagnosis not present

## 2022-01-28 DIAGNOSIS — A4181 Sepsis due to Enterococcus: Secondary | ICD-10-CM | POA: Diagnosis not present

## 2022-01-29 ENCOUNTER — Telehealth: Payer: Self-pay

## 2022-01-29 ENCOUNTER — Encounter: Payer: Self-pay | Admitting: Infectious Disease

## 2022-01-29 ENCOUNTER — Ambulatory Visit: Payer: Medicare HMO | Admitting: Infectious Disease

## 2022-01-29 ENCOUNTER — Other Ambulatory Visit: Payer: Self-pay

## 2022-01-29 VITALS — BP 134/80 | HR 86 | Wt 123.8 lb

## 2022-01-29 DIAGNOSIS — A429 Actinomycosis, unspecified: Secondary | ICD-10-CM | POA: Diagnosis not present

## 2022-01-29 DIAGNOSIS — I10 Essential (primary) hypertension: Secondary | ICD-10-CM

## 2022-01-29 DIAGNOSIS — R42 Dizziness and giddiness: Secondary | ICD-10-CM

## 2022-01-29 DIAGNOSIS — H532 Diplopia: Secondary | ICD-10-CM

## 2022-01-29 DIAGNOSIS — R000 Tachycardia, unspecified: Secondary | ICD-10-CM

## 2022-01-29 DIAGNOSIS — A498 Other bacterial infections of unspecified site: Secondary | ICD-10-CM

## 2022-01-29 DIAGNOSIS — I471 Supraventricular tachycardia, unspecified: Secondary | ICD-10-CM

## 2022-01-29 DIAGNOSIS — M272 Inflammatory conditions of jaws: Secondary | ICD-10-CM

## 2022-01-29 HISTORY — DX: Other bacterial infections of unspecified site: A49.8

## 2022-01-29 HISTORY — DX: Dizziness and giddiness: R42

## 2022-01-29 HISTORY — DX: Actinomycosis, unspecified: A42.9

## 2022-01-29 HISTORY — DX: Diplopia: H53.2

## 2022-01-29 MED ORDER — AMOXICILLIN 500 MG PO CAPS: 500.0000 mg | ORAL_CAPSULE | Freq: Three times a day (TID) | ORAL | 11 refills | Status: DC

## 2022-01-29 NOTE — Progress Notes (Signed)
? ?Subjective:  ? ?Chief complaint occasional dizziness and also seeing double at times along with some feeling of being out of breath ? ? Patient ID: Karina [name], female    DOB: June 04, 1933, 88 y.o.   MRN: [id number] ? ?HPI ? ?88 y.o. female with neck abscess and mandibular osteomyelitis status post I&D of abscess by ear nose and throat surgery.  I saw her in the inpatient service and she initially had Enterobacter cloacae growing from cultures but later and actinomyces species grew. ? ?She was continued on with ertapenem and she had repeat CT scan that continue to show improvement in her pathology.  She has follow-up with ear nose and throat on February 07, 2022. ? ?Since discharge from the hospital she has been having some intermittent dizziness particular when she stands up quickly.  She also has at times seen double. ? ?We did check orthostatics in the room and she did not have orthostatic vital signs from lying to standing.  She did develop some dizziness while in the room while seated and temporary double vision. ? ? ? ? ?Past Medical History:  ?Diagnosis Date  ? ANEMIA, CHRONIC 08/13/2006  ? Arthritis   ? left knee  ? Atrophic vaginitis 10/03/2013  ? CONSTIPATION 01/28/2011  ? Depression with anxiety 2012-04-04  ? Husband, [name] died in 09/01/2011 after 67 years of marriage  Lost a 6 year old daughter to cancer   ? DIVERTICULOSIS, COLON 01/28/2011  ? Endometriosis   ? GERD (gastroesophageal reflux disease)   ? HTN (hypertension) 03/06/2011  ? Hypercholesteremia 2012/04/04  ? Lesion of external ear 03/06/2011  ? OAB (overactive bladder) 12/17/2015  ? Osteoporosis   ? Post menopausal problems 03/06/2011  ? Sinus tachycardia   ? mild-- retesting  ? SVT (supraventricular tachycardia) ([place]) 2007  ? 3-4 beats.. Holter  ? ? ?Past Surgical History:  ?Procedure Laterality Date  ? ABDOMINAL HYSTERECTOMY    ? w/ unilateral oophorectomy-bleeding, ovarian cysts, left ovary left in place?  ? BREAST SURGERY    ?  Implants--Silicone  ? INCISION AND DRAINAGE ABSCESS Left 01/09/2022  ? Procedure: INCISION AND DRAINAGE NECK  ABSCESS;  Surgeon: Jason Coop, DO;  Location: [facility] OR;  Service: ENT;  Laterality: Left;  ? right knee arthroscopy, cleaned    ? ? ?Family History  ?Problem Relation Age of Onset  ? Stroke Mother   ? Hypertension Mother   ? Other Mother   ?     CHF  ? Transient ischemic attack Mother   ? Cancer Mother 41  ?     cervical  ? Cancer Father   ?     stomach, smoker  ? Cancer Sister   ?     brain  ? Lymphoma Brother   ? Cancer Brother   ? Stroke Daughter   ?     ? stroke  ? Mitral valve prolapse Daughter   ? Irritable bowel syndrome Daughter   ? Migraines Daughter   ? Hypertension Son   ? Other Maternal Grandmother   ?     CHF  ? Alcohol abuse Brother   ? Heart attack Brother   ?     smoker  ? Cancer Paternal Aunt   ?     cancer  ? Cancer Paternal Uncle   ?     X 2 CA  ? Heart attack Paternal Uncle   ? ? ?  ?Social History  ? ?Socioeconomic History  ? Marital  status: Widowed  ?  Spouse name: Not on file  ? Number of children: Not on file  ? Years of education: Not on file  ? Highest education level: Not on file  ?Occupational History  ? Not on file  ?Tobacco Use  ? Smoking status: Never  ? Smokeless tobacco: Never  ?Substance and Sexual Activity  ? Alcohol use: No  ?  Alcohol/week: 0.0 standard drinks  ? Drug use: No  ? Sexual activity: Never  ?  Partners: Male  ?  Birth control/protection: Surgical  ?  Comment: TAH--Poss. left ovary remains  ?Other Topics Concern  ? Not on file  ?Social History Narrative  ? Not on file  ? ?Social Determinants of Health  ? ?Financial Resource Strain: Not on file  ?Food Insecurity: Not on file  ?Transportation Needs: Not on file  ?Physical Activity: Not on file  ?Stress: Not on file  ?Social Connections: Not on file  ? ? ?No Known Allergies ? ? ?Current Outpatient Medications:  ?  acetaminophen (TYLENOL) 325 MG tablet, Take 2 tablets (650 mg total) by mouth every 6 (six) hours  as needed for mild pain (or Fever >/= 101)., Disp: 20 tablet, Rfl: 0 ?  ALPRAZolam (XANAX) 0.5 MG tablet, Take 1 tablet (0.5 mg total) by mouth as needed for sleep or anxiety. 1/2 to 2 tabs po bid prn for anxiety, insomnia (Patient taking differently: Take 0.25 mg by mouth 2 (two) times daily as needed for sleep or anxiety.), Disp: 90 tablet, Rfl: 0 ?  aspirin 81 MG tablet, Take 81 mg by mouth daily., Disp: , Rfl:  ?  Cholecalciferol (VITAMIN D-3 PO), Take 5,000 Units by mouth in the morning and at bedtime., Disp: , Rfl:  ?  Coenzyme Q10 (CO Q-10) 50 MG CAPS, Take 1 capsule by mouth daily., Disp: , Rfl:  ?  diltiazem (CARDIZEM CD) 240 MG 24 hr capsule, TAKE 1 CAPSULE (240 MG TOTAL) BY MOUTH DAILY., Disp: 90 capsule, Rfl: 2 ?  diphenhydramine-acetaminophen (TYLENOL PM) 25-500 MG TABS, Take 1 tablet by mouth at bedtime as needed (pain/sleep.). , Disp: , Rfl:  ?  ertapenem (INVANZ) IVPB, Inject 1 g into the vein daily. Indication: mandibular osteomyelitis First Dose: Yes Last Day of Therapy:  02/22/2022 Labs - Once weekly:  CBC/D and BMP, Labs - Every other week:  ESR and CRP Method of administration: Mini-Bag Plus / Gravity Method of administration may be changed at the discretion of home infusion pharmacist based upon assessment of the patient and/or caregiver's ability to self-administer the medication ordered., Disp: 41 Units, Rfl: 0 ?  levothyroxine (SYNTHROID) 75 MCG tablet, TAKE 1 TABLET EVERY DAY ON AN EMPTY STOMACH, Disp: , Rfl:  ?  metoprolol succinate (TOPROL-XL) 50 MG 24 hr tablet, Take 1 tablet by mouth 2 (two) times daily., Disp: , Rfl:  ?  Multiple Vitamins-Minerals (ALIVE WOMENS 50+) TABS, Take 1 tablet by mouth daily., Disp: , Rfl:  ?  ondansetron (ZOFRAN) 4 MG tablet, Take 1 tablet (4 mg total) by mouth every 6 (six) hours as needed for nausea., Disp: 20 tablet, Rfl: 0 ?  pantoprazole (PROTONIX) 40 MG tablet, Take 40 mg by mouth daily., Disp: , Rfl:  ?  simvastatin (ZOCOR) 20 MG tablet, Take 1 tablet  by mouth daily., Disp: , Rfl:  ?  saccharomyces boulardii (FLORASTOR) 250 MG capsule, Take 1 capsule (250 mg total) by mouth 3 (three) times daily. (Patient not taking: Reported on 01/29/2022), Disp: 180 capsule, Rfl: 0 ?  traMADol (ULTRAM) 50 MG tablet, Take 1 tablet (50 mg total) by mouth every 12 (twelve) hours as needed. (Patient not taking: Reported on 01/29/2022), Disp: 10 tablet, Rfl: 0 ? ? ?Review of Systems  ?Constitutional:  Negative for activity change, appetite change, chills, diaphoresis, fatigue, fever and unexpected weight change.  ?HENT:  Negative for congestion, rhinorrhea, sinus pressure, sneezing, sore throat and trouble swallowing.   ?Eyes:  Positive for visual disturbance. Negative for photophobia.  ?Respiratory:  Positive for shortness of breath. Negative for cough, chest tightness, wheezing and stridor.   ?Cardiovascular:  Negative for chest pain, palpitations and leg swelling.  ?Gastrointestinal:  Negative for abdominal distention, abdominal pain, anal bleeding, blood in stool, constipation, diarrhea, nausea and vomiting.  ?Genitourinary:  Negative for difficulty urinating, dysuria, flank pain and hematuria.  ?Musculoskeletal:  Negative for arthralgias, back pain, gait problem, joint swelling and myalgias.  ?Skin:  Negative for color change, pallor, rash and wound.  ?Neurological:  Positive for dizziness. Negative for tremors, weakness and light-headedness.  ?Hematological:  Negative for adenopathy. Does not bruise/bleed easily.  ?Psychiatric/Behavioral:  Negative for agitation, behavioral problems, confusion, decreased concentration, dysphoric mood and sleep disturbance.   ? ?   ?Objective:  ? Physical Exam ?Constitutional:   ?   General: She is not in acute distress. ?   Appearance: Normal appearance. She is well-developed. She is not ill-appearing or diaphoretic.  ?HENT:  ?   Head: Normocephalic and atraumatic.  ?   Right Ear: Hearing and external ear normal.  ?   Left Ear: Hearing and  external ear normal.  ?   Nose: No nasal deformity or rhinorrhea.  ?Eyes:  ?   General: No scleral icterus. ?   Conjunctiva/sclera: Conjunctivae normal.  ?   Right eye: Right conjunctiva is not injected.  ?

## 2022-01-29 NOTE — Telephone Encounter (Signed)
Per Dr. Tommy Medal called [facility] with verbal order to pull picc line after last dose on 4/12. Spoke with Amy, Pharmacist who was able to take verbal. Orders repeated and confirmed before ending call. ?Leatrice Jewels, RMA ? ?

## 2022-01-31 DIAGNOSIS — R6520 Severe sepsis without septic shock: Secondary | ICD-10-CM | POA: Diagnosis not present

## 2022-01-31 DIAGNOSIS — A419 Sepsis, unspecified organism: Secondary | ICD-10-CM | POA: Diagnosis not present

## 2022-01-31 DIAGNOSIS — M272 Inflammatory conditions of jaws: Secondary | ICD-10-CM | POA: Diagnosis not present

## 2022-01-31 DIAGNOSIS — L03211 Cellulitis of face: Secondary | ICD-10-CM | POA: Diagnosis not present

## 2022-02-05 DIAGNOSIS — A4181 Sepsis due to Enterococcus: Secondary | ICD-10-CM | POA: Diagnosis not present

## 2022-02-07 DIAGNOSIS — A419 Sepsis, unspecified organism: Secondary | ICD-10-CM | POA: Diagnosis not present

## 2022-02-07 DIAGNOSIS — L03211 Cellulitis of face: Secondary | ICD-10-CM | POA: Diagnosis not present

## 2022-02-07 DIAGNOSIS — R6520 Severe sepsis without septic shock: Secondary | ICD-10-CM | POA: Diagnosis not present

## 2022-02-07 DIAGNOSIS — M272 Inflammatory conditions of jaws: Secondary | ICD-10-CM | POA: Diagnosis not present

## 2022-02-08 DIAGNOSIS — M272 Inflammatory conditions of jaws: Secondary | ICD-10-CM | POA: Diagnosis not present

## 2022-02-11 DIAGNOSIS — A4181 Sepsis due to Enterococcus: Secondary | ICD-10-CM | POA: Diagnosis not present

## 2022-02-12 DIAGNOSIS — M79642 Pain in left hand: Secondary | ICD-10-CM | POA: Diagnosis not present

## 2022-02-14 DIAGNOSIS — L03211 Cellulitis of face: Secondary | ICD-10-CM | POA: Diagnosis not present

## 2022-02-14 DIAGNOSIS — A419 Sepsis, unspecified organism: Secondary | ICD-10-CM | POA: Diagnosis not present

## 2022-02-14 DIAGNOSIS — R6520 Severe sepsis without septic shock: Secondary | ICD-10-CM | POA: Diagnosis not present

## 2022-02-14 DIAGNOSIS — M272 Inflammatory conditions of jaws: Secondary | ICD-10-CM | POA: Diagnosis not present

## 2022-02-18 DIAGNOSIS — A4181 Sepsis due to Enterococcus: Secondary | ICD-10-CM | POA: Diagnosis not present

## 2022-03-06 ENCOUNTER — Other Ambulatory Visit: Payer: Self-pay

## 2022-03-06 ENCOUNTER — Ambulatory Visit: Payer: Medicare HMO | Admitting: Infectious Disease

## 2022-03-06 ENCOUNTER — Encounter: Payer: Self-pay | Admitting: Infectious Disease

## 2022-03-06 VITALS — BP 162/81 | HR 65 | Resp 16 | Ht 63.0 in | Wt 123.0 lb

## 2022-03-06 DIAGNOSIS — A429 Actinomycosis, unspecified: Secondary | ICD-10-CM

## 2022-03-06 DIAGNOSIS — R011 Cardiac murmur, unspecified: Secondary | ICD-10-CM

## 2022-03-06 DIAGNOSIS — M272 Inflammatory conditions of jaws: Secondary | ICD-10-CM | POA: Diagnosis not present

## 2022-03-06 DIAGNOSIS — I10 Essential (primary) hypertension: Secondary | ICD-10-CM | POA: Diagnosis not present

## 2022-03-06 DIAGNOSIS — R42 Dizziness and giddiness: Secondary | ICD-10-CM | POA: Diagnosis not present

## 2022-03-06 DIAGNOSIS — A498 Other bacterial infections of unspecified site: Secondary | ICD-10-CM | POA: Diagnosis not present

## 2022-03-06 HISTORY — DX: Cardiac murmur, unspecified: R01.1

## 2022-03-06 HISTORY — DX: Dizziness and giddiness: R42

## 2022-03-06 NOTE — Progress Notes (Signed)
? ?Subjective:  ? ?Chief complaint: Follow-up for osteomyelitis still with dizziness that is episodic in nature. ? ? ? ? ? Patient ID: Karina [name], female    DOB: [birth year], 88 y.o.   MRN: [id number] ? ?HPI ? ?88 y.o. female with neck abscess and mandibular osteomyelitis status post I&D of abscess by ear nose and throat surgery.  I saw her in the inpatient service and she initially had Enterobacter cloacae growing from cultures but later and actinomyces species grew. ? ?She was continued on with ertapenem and she had repeat CT scan that continue to show improvement in her pathology.  She has follow-up with ear nose and throat on February 07, 2022. ? ?Since discharge from the hospital she has been having some intermittent dizziness particular when she stands up quickly.  She also has at times seen double. ? ?We did check orthostatic vitals last time and she was not orthostatic. ? ?She continues to have episodic dizziness can happen at any moment in time but then goes away. ? ?She does have a cardiologist Dr. Marlou Porch I suggested she see him for follow-up. ? ?Her jaw is continue to improve in terms of pain and she able to open her mouth up more she has numbness in her chin due to the surgery and has trouble applying lipstick so she because she cannot feel her face in that area properly. ? ? ? ? ? ? ?Past Medical History:  ?Diagnosis Date  ? Actinomyces infection 01/29/2022  ? ANEMIA, CHRONIC 08/13/2006  ? Arthritis   ? left knee  ? Atrophic vaginitis 10/03/2013  ? CONSTIPATION 01/28/2011  ? Depression with anxiety 03-27-12  ? Husband, [name] died in 08-13-2011 after 73 years of marriage  Lost a 14 year old daughter to cancer   ? DIVERTICULOSIS, COLON 01/28/2011  ? Double vision 01/29/2022  ? Endometriosis   ? GERD (gastroesophageal reflux disease)   ? HTN (hypertension) 03/06/2011  ? Hypercholesteremia 03-27-12  ? Infection due to Enterobacter cloacae 01/29/2022  ? Lesion of external ear 03/06/2011  ? OAB  (overactive bladder) 12/17/2015  ? Osteoporosis   ? Post menopausal problems 03/06/2011  ? Postural dizziness 01/29/2022  ? Sinus tachycardia   ? mild-- retesting  ? SVT (supraventricular tachycardia) ([place]) 2007  ? 3-4 beats.. Holter  ? ? ?Past Surgical History:  ?Procedure Laterality Date  ? ABDOMINAL HYSTERECTOMY    ? w/ unilateral oophorectomy-bleeding, ovarian cysts, left ovary left in place?  ? BREAST SURGERY    ? Implants--Silicone  ? INCISION AND DRAINAGE ABSCESS Left 01/09/2022  ? Procedure: INCISION AND DRAINAGE NECK  ABSCESS;  Surgeon: Jason Coop, DO;  Location: [facility] OR;  Service: ENT;  Laterality: Left;  ? right knee arthroscopy, cleaned    ? ? ?Family History  ?Problem Relation Age of Onset  ? Stroke Mother   ? Hypertension Mother   ? Other Mother   ?     CHF  ? Transient ischemic attack Mother   ? Cancer Mother 72  ?     cervical  ? Cancer Father   ?     stomach, smoker  ? Cancer Sister   ?     brain  ? Lymphoma Brother   ? Cancer Brother   ? Stroke Daughter   ?     ? stroke  ? Mitral valve prolapse Daughter   ? Irritable bowel syndrome Daughter   ? Migraines Daughter   ? Hypertension Son   ?  Other Maternal Grandmother   ?     CHF  ? Alcohol abuse Brother   ? Heart attack Brother   ?     smoker  ? Cancer Paternal Aunt   ?     cancer  ? Cancer Paternal Uncle   ?     X 2 CA  ? Heart attack Paternal Uncle   ? ? ?  ?Social History  ? ?Socioeconomic History  ? Marital status: Widowed  ?  Spouse name: Not on file  ? Number of children: Not on file  ? Years of education: Not on file  ? Highest education level: Not on file  ?Occupational History  ? Not on file  ?Tobacco Use  ? Smoking status: Never  ? Smokeless tobacco: Never  ?Substance and Sexual Activity  ? Alcohol use: No  ?  Alcohol/week: 0.0 standard drinks  ? Drug use: No  ? Sexual activity: Never  ?  Partners: Male  ?  Birth control/protection: Surgical  ?  Comment: TAH--Poss. left ovary remains  ?Other Topics Concern  ? Not on file  ?Social History  Narrative  ? Not on file  ? ?Social Determinants of Health  ? ?Financial Resource Strain: Not on file  ?Food Insecurity: Not on file  ?Transportation Needs: Not on file  ?Physical Activity: Not on file  ?Stress: Not on file  ?Social Connections: Not on file  ? ? ?No Known Allergies ? ? ?Current Outpatient Medications:  ?  acetaminophen (TYLENOL) 325 MG tablet, Take 2 tablets (650 mg total) by mouth every 6 (six) hours as needed for mild pain (or Fever >/= 101)., Disp: 20 tablet, Rfl: 0 ?  ALPRAZolam (XANAX) 0.5 MG tablet, Take 1 tablet (0.5 mg total) by mouth as needed for sleep or anxiety. 1/2 to 2 tabs po bid prn for anxiety, insomnia (Patient taking differently: Take 0.25 mg by mouth 2 (two) times daily as needed for sleep or anxiety.), Disp: 90 tablet, Rfl: 0 ?  amoxicillin (AMOXIL) 500 MG capsule, Take 1 capsule (500 mg total) by mouth 3 (three) times daily. To start after finishing IV invanz, Disp: 90 capsule, Rfl: 11 ?  aspirin 81 MG tablet, Take 81 mg by mouth daily., Disp: , Rfl:  ?  Cholecalciferol (VITAMIN D-3 PO), Take 5,000 Units by mouth in the morning and at bedtime., Disp: , Rfl:  ?  Coenzyme Q10 (CO Q-10) 50 MG CAPS, Take 1 capsule by mouth daily., Disp: , Rfl:  ?  diltiazem (CARDIZEM CD) 240 MG 24 hr capsule, TAKE 1 CAPSULE (240 MG TOTAL) BY MOUTH DAILY., Disp: 90 capsule, Rfl: 2 ?  diphenhydramine-acetaminophen (TYLENOL PM) 25-500 MG TABS, Take 1 tablet by mouth at bedtime as needed (pain/sleep.). , Disp: , Rfl:  ?  levothyroxine (SYNTHROID) 75 MCG tablet, TAKE 1 TABLET EVERY DAY ON AN EMPTY STOMACH, Disp: , Rfl:  ?  metoprolol succinate (TOPROL-XL) 50 MG 24 hr tablet, Take 1 tablet by mouth 2 (two) times daily., Disp: , Rfl:  ?  Multiple Vitamins-Minerals (ALIVE WOMENS 50+) TABS, Take 1 tablet by mouth daily., Disp: , Rfl:  ?  ondansetron (ZOFRAN) 4 MG tablet, Take 1 tablet (4 mg total) by mouth every 6 (six) hours as needed for nausea., Disp: 20 tablet, Rfl: 0 ?  pantoprazole (PROTONIX) 40 MG  tablet, Take 40 mg by mouth daily., Disp: , Rfl:  ?  saccharomyces boulardii (FLORASTOR) 250 MG capsule, Take 1 capsule (250 mg total) by mouth 3 (three) times  daily., Disp: 180 capsule, Rfl: 0 ?  simvastatin (ZOCOR) 20 MG tablet, Take 1 tablet by mouth daily., Disp: , Rfl:  ?  traMADol (ULTRAM) 50 MG tablet, Take 1 tablet (50 mg total) by mouth every 12 (twelve) hours as needed., Disp: 10 tablet, Rfl: 0 ? ? ?Review of Systems  ?Constitutional:  Negative for activity change, appetite change, chills, diaphoresis, fatigue, fever and unexpected weight change.  ?HENT:  Negative for congestion, rhinorrhea, sinus pressure, sneezing, sore throat and trouble swallowing.   ?Eyes:  Negative for photophobia and visual disturbance.  ?Respiratory:  Negative for cough, chest tightness, shortness of breath, wheezing and stridor.   ?Cardiovascular:  Negative for chest pain, palpitations and leg swelling.  ?Gastrointestinal:  Negative for abdominal distention, abdominal pain, anal bleeding, blood in stool, constipation, diarrhea, nausea and vomiting.  ?Genitourinary:  Negative for difficulty urinating, dysuria, flank pain and hematuria.  ?Musculoskeletal:  Negative for arthralgias, back pain, gait problem, joint swelling and myalgias.  ?Skin:  Negative for color change, pallor, rash and wound.  ?Neurological:  Positive for dizziness. Negative for tremors, weakness and light-headedness.  ?Hematological:  Negative for adenopathy. Does not bruise/bleed easily.  ?Psychiatric/Behavioral:  Negative for agitation, behavioral problems, confusion, decreased concentration, dysphoric mood and sleep disturbance.   ? ?   ?Objective:  ? Physical Exam ?Constitutional:   ?   General: She is not in acute distress. ?   Appearance: Normal appearance. She is well-developed. She is not ill-appearing or diaphoretic.  ?HENT:  ?   Head: Normocephalic and atraumatic.  ?   Right Ear: Hearing and external ear normal.  ?   Left Ear: Hearing and external ear  normal.  ?   Nose: No nasal deformity or rhinorrhea.  ?Eyes:  ?   General: No scleral icterus. ?   Extraocular Movements: Extraocular movements intact.  ?   Conjunctiva/sclera: Conjunctivae normal.  ?   Right

## 2022-03-07 LAB — CBC WITH DIFFERENTIAL/PLATELET
Absolute Monocytes: 696 cells/uL (ref 200–950)
Basophils Absolute: 88 cells/uL (ref 0–200)
Basophils Relative: 1.1 %
Eosinophils Absolute: 280 cells/uL (ref 15–500)
Eosinophils Relative: 3.5 %
HCT: 35.2 % (ref 35.0–45.0)
Hemoglobin: 11.2 g/dL — ABNORMAL LOW (ref 11.7–15.5)
Lymphs Abs: 3384 cells/uL (ref 850–3900)
MCH: 27.1 pg (ref 27.0–33.0)
MCHC: 31.8 g/dL — ABNORMAL LOW (ref 32.0–36.0)
MCV: 85.2 fL (ref 80.0–100.0)
MPV: 11 fL (ref 7.5–12.5)
Monocytes Relative: 8.7 %
Neutro Abs: 3552 cells/uL (ref 1500–7800)
Neutrophils Relative %: 44.4 %
Platelets: 334 10*3/uL (ref 140–400)
RBC: 4.13 10*6/uL (ref 3.80–5.10)
RDW: 12.6 % (ref 11.0–15.0)
Total Lymphocyte: 42.3 %
WBC: 8 10*3/uL (ref 3.8–10.8)

## 2022-03-07 LAB — BASIC METABOLIC PANEL WITH GFR
BUN/Creatinine Ratio: 21 (calc) (ref 6–22)
BUN: 29 mg/dL — ABNORMAL HIGH (ref 7–25)
CO2: 27 mmol/L (ref 20–32)
Calcium: 10.2 mg/dL (ref 8.6–10.4)
Chloride: 104 mmol/L (ref 98–110)
Creat: 1.36 mg/dL — ABNORMAL HIGH (ref 0.60–0.95)
Glucose, Bld: 101 mg/dL — ABNORMAL HIGH (ref 65–99)
Potassium: 4.2 mmol/L (ref 3.5–5.3)
Sodium: 142 mmol/L (ref 135–146)
eGFR: 38 mL/min/{1.73_m2} — ABNORMAL LOW (ref >=60)

## 2022-03-07 LAB — SEDIMENTATION RATE: Sed Rate: 46 mm/h — ABNORMAL HIGH (ref 0–30)

## 2022-03-07 LAB — C-REACTIVE PROTEIN: CRP: 3.8 mg/L (ref <8.0)

## 2022-03-18 DIAGNOSIS — E785 Hyperlipidemia, unspecified: Secondary | ICD-10-CM | POA: Diagnosis not present

## 2022-03-18 DIAGNOSIS — I10 Essential (primary) hypertension: Secondary | ICD-10-CM | POA: Diagnosis not present

## 2022-03-18 DIAGNOSIS — F419 Anxiety disorder, unspecified: Secondary | ICD-10-CM | POA: Diagnosis not present

## 2022-03-18 DIAGNOSIS — M810 Age-related osteoporosis without current pathological fracture: Secondary | ICD-10-CM | POA: Diagnosis not present

## 2022-03-18 DIAGNOSIS — E039 Hypothyroidism, unspecified: Secondary | ICD-10-CM | POA: Diagnosis not present

## 2022-03-25 DIAGNOSIS — M810 Age-related osteoporosis without current pathological fracture: Secondary | ICD-10-CM | POA: Diagnosis not present

## 2022-03-25 DIAGNOSIS — R413 Other amnesia: Secondary | ICD-10-CM | POA: Diagnosis not present

## 2022-03-25 DIAGNOSIS — E785 Hyperlipidemia, unspecified: Secondary | ICD-10-CM | POA: Diagnosis not present

## 2022-03-25 DIAGNOSIS — E039 Hypothyroidism, unspecified: Secondary | ICD-10-CM | POA: Diagnosis not present

## 2022-03-25 DIAGNOSIS — I10 Essential (primary) hypertension: Secondary | ICD-10-CM | POA: Diagnosis not present

## 2022-03-25 DIAGNOSIS — M272 Inflammatory conditions of jaws: Secondary | ICD-10-CM | POA: Diagnosis not present

## 2022-03-25 DIAGNOSIS — R42 Dizziness and giddiness: Secondary | ICD-10-CM | POA: Diagnosis not present

## 2022-03-25 DIAGNOSIS — A419 Sepsis, unspecified organism: Secondary | ICD-10-CM | POA: Diagnosis not present

## 2022-03-25 DIAGNOSIS — F419 Anxiety disorder, unspecified: Secondary | ICD-10-CM | POA: Diagnosis not present

## 2022-03-25 DIAGNOSIS — Z1339 Encounter for screening examination for other mental health and behavioral disorders: Secondary | ICD-10-CM | POA: Diagnosis not present

## 2022-03-25 DIAGNOSIS — Z1331 Encounter for screening for depression: Secondary | ICD-10-CM | POA: Diagnosis not present

## 2022-04-29 DIAGNOSIS — Z01419 Encounter for gynecological examination (general) (routine) without abnormal findings: Secondary | ICD-10-CM | POA: Diagnosis not present

## 2022-04-29 DIAGNOSIS — N83202 Unspecified ovarian cyst, left side: Secondary | ICD-10-CM | POA: Diagnosis not present

## 2022-05-06 ENCOUNTER — Telehealth: Payer: Self-pay

## 2022-05-07 ENCOUNTER — Other Ambulatory Visit: Payer: Self-pay

## 2022-05-07 ENCOUNTER — Encounter: Payer: Self-pay | Admitting: Infectious Disease

## 2022-05-07 ENCOUNTER — Ambulatory Visit: Payer: Medicare HMO | Admitting: Infectious Disease

## 2022-05-07 VITALS — BP 143/78 | HR 66 | Temp 97.5°F | Ht 63.0 in | Wt 122.0 lb

## 2022-05-07 DIAGNOSIS — M272 Inflammatory conditions of jaws: Secondary | ICD-10-CM

## 2022-05-07 DIAGNOSIS — R42 Dizziness and giddiness: Secondary | ICD-10-CM

## 2022-05-07 DIAGNOSIS — A429 Actinomycosis, unspecified: Secondary | ICD-10-CM | POA: Diagnosis not present

## 2022-05-08 LAB — BASIC METABOLIC PANEL WITH GFR
BUN/Creatinine Ratio: 19 (calc) (ref 6–22)
BUN: 21 mg/dL (ref 7–25)
CO2: 27 mmol/L (ref 20–32)
Calcium: 10.2 mg/dL (ref 8.6–10.4)
Chloride: 105 mmol/L (ref 98–110)
Creat: 1.09 mg/dL — ABNORMAL HIGH (ref 0.60–0.95)
Glucose, Bld: 81 mg/dL (ref 65–99)
Potassium: 3.7 mmol/L (ref 3.5–5.3)
Sodium: 143 mmol/L (ref 135–146)
eGFR: 49 mL/min/{1.73_m2} — ABNORMAL LOW (ref >=60)

## 2022-05-08 LAB — SEDIMENTATION RATE: Sed Rate: 38 mm/h — ABNORMAL HIGH (ref 0–30)

## 2022-05-08 LAB — CBC WITH DIFFERENTIAL/PLATELET
Absolute Monocytes: 591 cells/uL (ref 200–950)
Basophils Absolute: 81 cells/uL (ref 0–200)
Basophils Relative: 1 %
Eosinophils Absolute: 178 cells/uL (ref 15–500)
Eosinophils Relative: 2.2 %
HCT: 36.5 % (ref 35.0–45.0)
Hemoglobin: 11.9 g/dL (ref 11.7–15.5)
Lymphs Abs: 3621 cells/uL (ref 850–3900)
MCH: 27.5 pg (ref 27.0–33.0)
MCHC: 32.6 g/dL (ref 32.0–36.0)
MCV: 84.3 fL (ref 80.0–100.0)
MPV: 11.1 fL (ref 7.5–12.5)
Monocytes Relative: 7.3 %
Neutro Abs: 3629 cells/uL (ref 1500–7800)
Neutrophils Relative %: 44.8 %
Platelets: 286 10*3/uL (ref 140–400)
RBC: 4.33 10*6/uL (ref 3.80–5.10)
RDW: 12.8 % (ref 11.0–15.0)
Total Lymphocyte: 44.7 %
WBC: 8.1 10*3/uL (ref 3.8–10.8)

## 2022-05-08 LAB — C-REACTIVE PROTEIN: CRP: 2.2 mg/L (ref <8.0)

## 2022-05-23 ENCOUNTER — Emergency Department (HOSPITAL_COMMUNITY): Payer: Medicare HMO

## 2022-05-23 ENCOUNTER — Inpatient Hospital Stay (HOSPITAL_COMMUNITY)
Admission: EM | Admit: 2022-05-23 | Discharge: 2022-05-26 | DRG: 690 | Disposition: A | Discharge status: Home / Self Care | Payer: Medicare HMO | Attending: Internal Medicine | Admitting: Internal Medicine

## 2022-05-23 DIAGNOSIS — K5730 Diverticulosis of large intestine without perforation or abscess without bleeding: Secondary | ICD-10-CM | POA: Diagnosis not present

## 2022-05-23 DIAGNOSIS — Z1611 Resistance to penicillins: Secondary | ICD-10-CM | POA: Diagnosis not present

## 2022-05-23 DIAGNOSIS — E039 Hypothyroidism, unspecified: Secondary | ICD-10-CM | POA: Diagnosis present

## 2022-05-23 DIAGNOSIS — I119 Hypertensive heart disease without heart failure: Secondary | ICD-10-CM | POA: Diagnosis present

## 2022-05-23 DIAGNOSIS — Z7982 Long term (current) use of aspirin: Secondary | ICD-10-CM

## 2022-05-23 DIAGNOSIS — F418 Other specified anxiety disorders: Secondary | ICD-10-CM | POA: Diagnosis not present

## 2022-05-23 DIAGNOSIS — K219 Gastro-esophageal reflux disease without esophagitis: Secondary | ICD-10-CM | POA: Diagnosis present

## 2022-05-23 DIAGNOSIS — S0990XA Unspecified injury of head, initial encounter: Secondary | ICD-10-CM | POA: Diagnosis not present

## 2022-05-23 DIAGNOSIS — Z20822 Contact with and (suspected) exposure to covid-19: Secondary | ICD-10-CM | POA: Diagnosis not present

## 2022-05-23 DIAGNOSIS — I10 Essential (primary) hypertension: Secondary | ICD-10-CM | POA: Diagnosis not present

## 2022-05-23 DIAGNOSIS — R921 Mammographic calcification found on diagnostic imaging of breast: Secondary | ICD-10-CM | POA: Diagnosis not present

## 2022-05-23 DIAGNOSIS — N390 Urinary tract infection, site not specified: Secondary | ICD-10-CM | POA: Diagnosis not present

## 2022-05-23 DIAGNOSIS — Z8249 Family history of ischemic heart disease and other diseases of the circulatory system: Secondary | ICD-10-CM

## 2022-05-23 DIAGNOSIS — J3489 Other specified disorders of nose and nasal sinuses: Secondary | ICD-10-CM | POA: Diagnosis not present

## 2022-05-23 DIAGNOSIS — Z79890 Hormone replacement therapy (postmenopausal): Secondary | ICD-10-CM | POA: Diagnosis not present

## 2022-05-23 DIAGNOSIS — I517 Cardiomegaly: Secondary | ICD-10-CM | POA: Diagnosis not present

## 2022-05-23 DIAGNOSIS — Z811 Family history of alcohol abuse and dependence: Secondary | ICD-10-CM

## 2022-05-23 DIAGNOSIS — F419 Anxiety disorder, unspecified: Secondary | ICD-10-CM | POA: Diagnosis present

## 2022-05-23 DIAGNOSIS — I6529 Occlusion and stenosis of unspecified carotid artery: Secondary | ICD-10-CM | POA: Diagnosis not present

## 2022-05-23 DIAGNOSIS — Z823 Family history of stroke: Secondary | ICD-10-CM

## 2022-05-23 DIAGNOSIS — G319 Degenerative disease of nervous system, unspecified: Secondary | ICD-10-CM | POA: Diagnosis not present

## 2022-05-23 DIAGNOSIS — A419 Sepsis, unspecified organism: Secondary | ICD-10-CM | POA: Diagnosis not present

## 2022-05-23 DIAGNOSIS — J984 Other disorders of lung: Secondary | ICD-10-CM | POA: Diagnosis not present

## 2022-05-23 DIAGNOSIS — N281 Cyst of kidney, acquired: Secondary | ICD-10-CM | POA: Diagnosis not present

## 2022-05-23 DIAGNOSIS — M810 Age-related osteoporosis without current pathological fracture: Secondary | ICD-10-CM | POA: Diagnosis present

## 2022-05-23 DIAGNOSIS — J929 Pleural plaque without asbestos: Secondary | ICD-10-CM | POA: Diagnosis not present

## 2022-05-23 DIAGNOSIS — E7800 Pure hypercholesterolemia, unspecified: Secondary | ICD-10-CM | POA: Diagnosis not present

## 2022-05-23 DIAGNOSIS — I471 Supraventricular tachycardia: Secondary | ICD-10-CM | POA: Diagnosis present

## 2022-05-23 DIAGNOSIS — E876 Hypokalemia: Secondary | ICD-10-CM | POA: Diagnosis not present

## 2022-05-23 DIAGNOSIS — R5383 Other fatigue: Secondary | ICD-10-CM | POA: Diagnosis not present

## 2022-05-23 DIAGNOSIS — A429 Actinomycosis, unspecified: Secondary | ICD-10-CM | POA: Diagnosis not present

## 2022-05-23 DIAGNOSIS — R509 Fever, unspecified: Principal | ICD-10-CM

## 2022-05-23 DIAGNOSIS — Z9882 Breast implant status: Secondary | ICD-10-CM | POA: Diagnosis not present

## 2022-05-23 DIAGNOSIS — Z79899 Other long term (current) drug therapy: Secondary | ICD-10-CM

## 2022-05-23 DIAGNOSIS — Z807 Family history of other malignant neoplasms of lymphoid, hematopoietic and related tissues: Secondary | ICD-10-CM

## 2022-05-23 DIAGNOSIS — D649 Anemia, unspecified: Secondary | ICD-10-CM | POA: Diagnosis present

## 2022-05-23 DIAGNOSIS — E785 Hyperlipidemia, unspecified: Secondary | ICD-10-CM | POA: Diagnosis present

## 2022-05-23 DIAGNOSIS — B9620 Unspecified Escherichia coli [E. coli] as the cause of diseases classified elsewhere: Secondary | ICD-10-CM | POA: Diagnosis present

## 2022-05-23 DIAGNOSIS — I700 Atherosclerosis of aorta: Secondary | ICD-10-CM | POA: Diagnosis not present

## 2022-05-23 DIAGNOSIS — S129XXA Fracture of neck, unspecified, initial encounter: Secondary | ICD-10-CM | POA: Diagnosis not present

## 2022-05-23 LAB — CBG MONITORING, ED: Glucose-Capillary: 134 mg/dL — ABNORMAL HIGH (ref 70–99)

## 2022-05-23 MED ORDER — ACETAMINOPHEN 500 MG PO TABS
1000.0000 mg | ORAL_TABLET | Freq: Once | ORAL | Status: AC
  Administered 2022-05-23: 1000 mg via ORAL
  Filled 2022-05-23: qty 2

## 2022-05-23 NOTE — ED Provider Notes (Signed)
[facility] DEPT Provider Note   CSN: [id number] Arrival date & time: 05/23/22  2240     History  No chief complaint on file.   Karina [name] is a 88 y.o. female.  HPI  Patient with medical history of hyperlipidemia, chronic anemia, abscess jaw complicated by cellulitis on amoxicillin, hypertension presents today due to fever.  Patient lives independently, sister is also in the room providing additional insight.  Patient fell earlier today.  Unsure if she hit her head.  After that she "got sick", vomited 3 times.  Was found to have a fever and came to ED for further evaluation.  Patient denies any pain anywhere, she does endorse some intermittent dysuria over the last 3 days.  She is not on any blood thinners.  She denies chest pain or shortness of breath, no missed doses of medicine.  Home Medications Prior to Admission medications   Medication Sig Start Date End Date Taking? Authorizing Provider  ALPRAZolam Duanne Moron) 0.5 MG tablet Take 1 tablet (0.5 mg total) by mouth as needed for sleep or anxiety. 1/2 to 2 tabs po bid prn for anxiety, insomnia Patient taking differently: Take 0.25 mg by mouth 2 (two) times daily as needed for sleep or anxiety. 10/27/15  Yes Mosie Lukes, MD  amoxicillin (AMOXIL) 500 MG capsule Take 1 capsule (500 mg total) by mouth 3 (three) times daily. To start after finishing IV invanz 01/29/22  Yes Tommy Medal, Lavell Islam, MD  aspirin 81 MG tablet Take 81 mg by mouth daily.   Yes [provider]  Cholecalciferol (VITAMIN D-3 PO) Take 5,000 Units by mouth in the morning and at bedtime.   Yes [provider]  Coenzyme Q10 (CO Q-10) 50 MG CAPS Take 1 capsule by mouth daily.   Yes [provider]  diltiazem (CARDIZEM CD) 240 MG 24 hr capsule TAKE 1 CAPSULE (240 MG TOTAL) BY MOUTH DAILY. 06/07/20  Yes Kathyrn Drown D, NP  levothyroxine (SYNTHROID) 75 MCG tablet Take 75 mcg by mouth daily before breakfast.   Yes  [provider]  metoprolol succinate (TOPROL-XL) 50 MG 24 hr tablet Take 50 mg by mouth 2 (two) times daily.   Yes [provider]  Multiple Vitamins-Minerals (ALIVE WOMENS 50+) TABS Take 1 tablet by mouth daily. 12/23/16  Yes [provider]  pantoprazole (PROTONIX) 40 MG tablet Take 40 mg by mouth daily.   Yes [provider]  simvastatin (ZOCOR) 20 MG tablet Take 20 mg by mouth daily.   Yes [provider]  acetaminophen (TYLENOL) 325 MG tablet Take 2 tablets (650 mg total) by mouth every 6 (six) hours as needed for mild pain (or Fever >/= 101). Patient not taking: Reported on 05/24/2022 01/16/22   Raiford Noble Latif, DO  meloxicam (MOBIC) 15 MG tablet Take 15 mg by mouth daily. Patient not taking: Reported on 05/24/2022 04/20/22   [provider]  ondansetron (ZOFRAN) 4 MG tablet Take 1 tablet (4 mg total) by mouth every 6 (six) hours as needed for nausea. Patient not taking: Reported on 05/07/2022 01/16/22   Raiford Noble Latif, DO  saccharomyces boulardii (FLORASTOR) 250 MG capsule Take 1 capsule (250 mg total) by mouth 3 (three) times daily. Patient not taking: Reported on 05/24/2022 01/16/22   Raiford Noble Latif, DO  traMADol (ULTRAM) 50 MG tablet Take 1 tablet (50 mg total) by mouth every 12 (twelve) hours as needed. Patient not taking: Reported on 05/07/2022 01/16/22 01/16/23  Alfredia Ferguson,  Bertram Savin, DO      Allergies    Patient has no known allergies.    Review of Systems   Review of Systems  Physical Exam Updated Vital Signs BP (!) 145/71   Pulse 83   Temp (!) 100.9 F (38.3 C) (Rectal)   Resp (!) 22   Ht '5\' 3"'$  (1.6 m)   Wt 55.3 kg   SpO2 93%   BMI 21.61 kg/m  Physical Exam Vitals and nursing note reviewed. Exam conducted with a chaperone present.  Constitutional:      Appearance: Normal appearance. She is ill-appearing.  HENT:     Head: Normocephalic and atraumatic.  Eyes:     General: No scleral icterus.       Right eye: No  discharge.        Left eye: No discharge.     Extraocular Movements: Extraocular movements intact.     Pupils: Pupils are equal, round, and reactive to light.  Cardiovascular:     Rate and Rhythm: Normal rate and regular rhythm.     Pulses: Normal pulses.     Heart sounds: Normal heart sounds. No murmur heard.    No friction rub. No gallop.  Pulmonary:     Effort: No respiratory distress.     Breath sounds: Normal breath sounds.     Comments: Mild tachypnea Abdominal:     General: Abdomen is flat. Bowel sounds are normal. There is no distension.     Palpations: Abdomen is soft.     Tenderness: There is abdominal tenderness.     Comments: Diffuse abdominal tenderness  Musculoskeletal:     Comments: Moving upper and lower extremities  Skin:    General: Skin is warm and dry.     Coloration: Skin is not jaundiced.  Neurological:     Mental Status: She is alert. Mental status is at baseline.     Coordination: Coordination normal.     ED Results / Procedures / Treatments   Labs (all labs ordered are listed, but only abnormal results are displayed) Labs Reviewed  URINALYSIS, ROUTINE W REFLEX MICROSCOPIC - Abnormal; Notable for the following components:      Result Value   Color, Urine STRAW (*)    Ketones, ur 20 (*)    All other components within normal limits  CBC WITH DIFFERENTIAL/PLATELET - Abnormal; Notable for the following components:   Hemoglobin 11.4 (*)    HCT 35.4 (*)    All other components within normal limits  COMPREHENSIVE METABOLIC PANEL - Abnormal; Notable for the following components:   Potassium 3.3 (*)    Glucose, Bld 131 (*)    Total Protein 8.2 (*)    GFR, Estimated 56 (*)    All other components within normal limits  CBG MONITORING, ED - Abnormal; Notable for the following components:   Glucose-Capillary 134 (*)    All other components within normal limits  RESP PANEL BY RT-PCR (FLU A&B, COVID) ARPGX2  CULTURE, BLOOD (ROUTINE X 2)  CULTURE, BLOOD  (ROUTINE X 2)  URINE CULTURE  LACTIC ACID, PLASMA  LACTIC ACID, PLASMA    EKG EKG Interpretation  Date/Time:  Thursday May 23 2022 23:57:53 EDT Ventricular Rate:  89 PR Interval:  197 QRS Duration: 96 QT Interval:  379 QTC Calculation: 462 R Axis:   54 Text Interpretation: Sinus rhythm Borderline T wave abnormalities No significant change since last tracing Confirmed by Calvert Cantor [phone]) on 05/24/2022 12:28:16 AM  Radiology CT ABDOMEN PELVIS  W CONTRAST  Result Date: 05/24/2022 CLINICAL DATA:  Sepsis. EXAM: CT ABDOMEN AND PELVIS WITH CONTRAST TECHNIQUE: Multidetector CT imaging of the abdomen and pelvis was performed using the standard protocol following bolus administration of intravenous contrast. RADIATION DOSE REDUCTION: This exam was performed according to the departmental dose-optimization program which includes automated exposure control, adjustment of the mA and/or kV according to patient size and/or use of iterative reconstruction technique. CONTRAST:  50m OMNIPAQUE IOHEXOL 300 MG/ML  SOLN COMPARISON:  CT abdomen pelvis dated 02/04/2008. FINDINGS: Evaluation of this exam is limited due to respiratory motion artifact. Lower chest: Bibasilar subpleural atelectasis. Coronary vascular calcification. No intra-abdominal free air or free fluid. Hepatobiliary: No focal liver abnormality is seen. No gallstones, gallbladder wall thickening, or biliary dilatation. Pancreas: Unremarkable. No pancreatic ductal dilatation or surrounding inflammatory changes. Spleen: Normal in size without focal abnormality. Adrenals/Urinary Tract: The adrenal glands are unremarkable. There is no hydronephrosis on either side. There is a 3 cm left renal interpolar cyst and additional smaller bilateral renal hypodense lesions which are not characterized on this CT. These can be better evaluated with ultrasound on a nonemergent/outpatient basis. There is symmetric enhancement and excretion of contrast by both  kidneys. The visualized ureters and urinary bladder appear unremarkable. Stomach/Bowel: There is sigmoid diverticulosis without active inflammatory changes. There is moderate stool throughout the colon. No bowel obstruction or active inflammation. The appendix is not visualized with certainty. No inflammatory changes identified in the right lower quadrant. Vascular/Lymphatic: Mild aortoiliac atherosclerotic disease. The IVC is unremarkable. No portal venous gas. There is no adenopathy. Reproductive: Hysterectomy. A 5 x 8 cm left adnexal cyst (previously measured 3.5 x 6.0 cm on CT of 02/04/2008. This is likely a benign or indolent process given very slow growth and can be better evaluated with ultrasound on a nonemergent/outpatient basis. Other: None Musculoskeletal: Partially visualized bilateral breast implants. Osteopenia with degenerative changes of the spine. No acute osseous pathology. IMPRESSION: 1. No acute intra-abdominal or pelvic pathology. 2. Sigmoid diverticulosis. No bowel obstruction. 3. Aortic Atherosclerosis (ICD10-I70.0). Electronically Signed   By: AAnner CreteM.D.   On: 05/24/2022 02:39   CT Maxillofacial W Contrast  Result Date: 05/24/2022 CLINICAL DATA:  Initial evaluation for weakness, fever, history of prior abscess drainage at angle of left mandible. EXAM: CT MAXILLOFACIAL WITH CONTRAST TECHNIQUE: Multidetector CT imaging of the maxillofacial structures was performed with intravenous contrast. Multiplanar CT image reconstructions were also generated. RADIATION DOSE REDUCTION: This exam was performed according to the departmental dose-optimization program which includes automated exposure control, adjustment of the mA and/or kV according to patient size and/or use of iterative reconstruction technique. CONTRAST:  816mOMNIPAQUE IOHEXOL 300 MG/ML  SOLN COMPARISON:  Prior CT from 01/09/2022. FINDINGS: Osseous: No acute osseous finding. Patient is edentulous. Osteoarthritic changes  noted about the C1-2 articulation. Orbits: Globes and orbital soft tissues within normal limits. No evidence for postseptal or intraorbital cellulitis. Sinuses: Mild scattered mucoperiosteal thickening present about the ethmoidal air cells and maxillary sinuses. Paranasal sinuses are otherwise largely clear. Mastoid air cells and middle ear cavities are well pneumatized and free of fluid. Soft tissues: No significant soft tissue swelling or inflammatory stranding seen about the face. No discrete abscess or drainable fluid collection. Salivary glands including the parotid and submandibular glands are within normal limits. No adenopathy within the visualized upper neck. Oral cavity, oropharynx, and nasopharynx within normal limits. Retropharyngeal soft tissues within normal limits. Negative epiglottis. Vallecula clear. Vascular calcifications noted about the carotid bifurcations. Limited intracranial:  Unremarkable. IMPRESSION: Negative maxillofacial CT with no inflammatory changes seen about the face to suggest acute infection/cellulitis. No discrete abscess or drainable fluid collection. Electronically Signed   By: Jeannine Boga M.D.   On: 05/24/2022 02:22   CT Cervical Spine Wo Contrast  Result Date: 05/23/2022 CLINICAL DATA:  Compression fracture. EXAM: CT CERVICAL SPINE WITHOUT CONTRAST TECHNIQUE: Multidetector CT imaging of the cervical spine was performed without intravenous contrast. Multiplanar CT image reconstructions were also generated. RADIATION DOSE REDUCTION: This exam was performed according to the departmental dose-optimization program which includes automated exposure control, adjustment of the mA and/or kV according to patient size and/or use of iterative reconstruction technique. COMPARISON:  None. FINDINGS: Alignment: Normal. Skull base and vertebrae: No acute fracture. No primary bone lesion or focal pathologic process. Soft tissues and spinal canal: No prevertebral fluid or swelling.  No visible canal hematoma. Disc levels: No significant central canal or neural foraminal stenosis at any level. Upper chest: There is scarring in the lung apices. Other: None. IMPRESSION: No acute fracture or traumatic subluxation of the cervical spine. Electronically Signed   By: Ronney Asters M.D.   On: 05/23/2022 23:51   CT Head Wo Contrast  Result Date: 05/23/2022 CLINICAL DATA:  Head trauma EXAM: CT HEAD WITHOUT CONTRAST TECHNIQUE: Contiguous axial images were obtained from the base of the skull through the vertex without intravenous contrast. RADIATION DOSE REDUCTION: This exam was performed according to the departmental dose-optimization program which includes automated exposure control, adjustment of the mA and/or kV according to patient size and/or use of iterative reconstruction technique. COMPARISON:  CT brain 11/02/2013 FINDINGS: Brain: No acute territorial infarction, hemorrhage or intracranial mass. Mild to moderate atrophy. Moderate chronic small vessel ischemic changes of the white matter. Stable ventricle size. Vascular: No hyperdense vessels.  Carotid vascular calcification Skull: Normal. Negative for fracture or focal lesion. Sinuses/Orbits: Mucosal thickening in the sinuses Other: None IMPRESSION: 1. No CT evidence for acute intracranial abnormality. 2. Atrophy and chronic small vessel ischemic changes of the white matter Electronically Signed   By: Donavan Foil M.D.   On: 05/23/2022 23:49   DG Pelvis Portable  Result Date: 05/23/2022 CLINICAL DATA:  Fall, fever. EXAM: PORTABLE PELVIS 1-2 VIEWS COMPARISON:  None Available. FINDINGS: There is no evidence of pelvic fracture or diastasis. No pelvic bone lesions are seen. IMPRESSION: Negative. Electronically Signed   By: Ronney Asters M.D.   On: 05/23/2022 23:35   DG Chest Portable 1 View  Result Date: 05/23/2022 CLINICAL DATA:  Fall. EXAM: PORTABLE CHEST 1 VIEW COMPARISON:  Chest x-ray 01/09/2022 FINDINGS: Cardiomediastinal silhouette  appears stable. The aorta is ectatic in the heart is enlarged. There is no focal lung infiltrate, pleural effusion or pneumothorax identified. Right apical pleural-parenchymal thickening is unchanged. Peripherally calcified breast implants are present. No acute fractures are seen. IMPRESSION: No acute cardiopulmonary process.  No significant interval change. Electronically Signed   By: Ronney Asters M.D.   On: 05/23/2022 23:34    Procedures Procedures    Medications Ordered in ED Medications  acetaminophen (TYLENOL) tablet 1,000 mg (1,000 mg Oral Given 05/23/22 2351)  lactated ringers bolus 1,000 mL (1,000 mLs Intravenous Bolus 05/24/22 0231)  iohexol (OMNIPAQUE) 300 MG/ML solution 80 mL (80 mLs Intravenous Contrast Given 05/24/22 0146)  sodium chloride (PF) 0.9 % injection (  Given by Other 05/24/22 0231)    ED Course/ Medical Decision Making/ A&P  Medical Decision Making Amount and/or Complexity of Data Reviewed Labs: ordered. Radiology: ordered.  Risk OTC drugs. Prescription drug management. Decision regarding hospitalization.   Patient presents due to fatigue, fever and vomiting.    She had a fall earlier today, no traumatic findings on physical exam.  She does have diffuse abdominal tenderness on exam, lungs are clear to auscultation bilaterally but she is tachypneic.  No tachycardia, not hypoxic.  I ordered and reviewed laboratory work-up.  No leukocytosis, stable anemia roughly at her baseline.  CMP is without gross electrolyte derangement or AKI.  Lactic acid is negative, blood culture pending.  She is COVID-negative, UA is without findings of UTI.  I ordered and reviewed imaging.  Plain films of chest and pelvis are negative for any acute process.  CT head and C-spine also negative without any traumatic findings.  I agree with radiologist interpretation.  I viewed and ordered the CT abdomen and pelvis with contrast and the CT Facial with contrast given  history of osteomyelitis of the mandible.  There are no new findings suggesting recollection of abscess in the mandible, CT abdomen is without any acute abdominal findings.  Patient is still febrile, ill-appearing and slightly tachypneic.  Given her Tylenol and fluids.  I considered sending the patient home but given age and general ill-appearing I do think it be reasonable to admit for observation.  I spoke with hospitalist who agrees with admission.  I appreciate his help coordinating care for this patient.  Discussed HPI, physical exam and plan of care for this patient with attending Calvert Cantor. The attending physician evaluated this patient as part of a shared visit and agrees with plan of care.         Final Clinical Impression(s) / ED Diagnoses Final diagnoses:  Fever, unspecified fever cause    Rx / DC Orders ED Discharge Orders     None         Sherrill Raring, PA-C 05/24/22 [id number]    Truddie Hidden, MD 05/24/22 0345

## 2022-05-23 NOTE — ED Triage Notes (Signed)
Patient BIB EMS for fever, fatigue, and feeling bad. Vomited x 1 earlier today. Symptoms started around 5pm. Oral temp by EMS was 101.5. Patient is HOH.

## 2022-05-24 ENCOUNTER — Emergency Department (HOSPITAL_COMMUNITY): Payer: Medicare HMO

## 2022-05-24 ENCOUNTER — Encounter (HOSPITAL_COMMUNITY): Payer: Self-pay

## 2022-05-24 ENCOUNTER — Other Ambulatory Visit: Payer: Self-pay

## 2022-05-24 DIAGNOSIS — D649 Anemia, unspecified: Secondary | ICD-10-CM | POA: Diagnosis present

## 2022-05-24 DIAGNOSIS — F418 Other specified anxiety disorders: Secondary | ICD-10-CM | POA: Diagnosis not present

## 2022-05-24 DIAGNOSIS — E7800 Pure hypercholesterolemia, unspecified: Secondary | ICD-10-CM | POA: Diagnosis not present

## 2022-05-24 DIAGNOSIS — A429 Actinomycosis, unspecified: Secondary | ICD-10-CM

## 2022-05-24 DIAGNOSIS — E039 Hypothyroidism, unspecified: Secondary | ICD-10-CM | POA: Diagnosis not present

## 2022-05-24 DIAGNOSIS — I10 Essential (primary) hypertension: Secondary | ICD-10-CM

## 2022-05-24 DIAGNOSIS — I471 Supraventricular tachycardia: Secondary | ICD-10-CM

## 2022-05-24 DIAGNOSIS — R509 Fever, unspecified: Secondary | ICD-10-CM | POA: Diagnosis not present

## 2022-05-24 LAB — CBC WITH DIFFERENTIAL/PLATELET
Abs Immature Granulocytes: 0.04 10*3/uL (ref 0.00–0.07)
Basophils Absolute: 0.1 10*3/uL (ref 0.0–0.1)
Basophils Relative: 1 %
Eosinophils Absolute: 0 10*3/uL (ref 0.0–0.5)
Eosinophils Relative: 0 %
HCT: 35.4 % — ABNORMAL LOW (ref 36.0–46.0)
Hemoglobin: 11.4 g/dL — ABNORMAL LOW (ref 12.0–15.0)
Immature Granulocytes: 0 %
Lymphocytes Relative: 17 %
Lymphs Abs: 1.6 10*3/uL (ref 0.7–4.0)
MCH: 28 pg (ref 26.0–34.0)
MCHC: 32.2 g/dL (ref 30.0–36.0)
MCV: 87 fL (ref 80.0–100.0)
Monocytes Absolute: 0.6 10*3/uL (ref 0.1–1.0)
Monocytes Relative: 7 %
Neutro Abs: 7.3 10*3/uL (ref 1.7–7.7)
Neutrophils Relative %: 75 %
Platelets: 195 10*3/uL (ref 150–400)
RBC: 4.07 MIL/uL (ref 3.87–5.11)
RDW: 14 % (ref 11.5–15.5)
WBC: 9.7 10*3/uL (ref 4.0–10.5)
nRBC: 0 % (ref 0.0–0.2)

## 2022-05-24 LAB — RESP PANEL BY RT-PCR (FLU A&B, COVID) ARPGX2
Influenza A by PCR: NEGATIVE
Influenza B by PCR: NEGATIVE
SARS Coronavirus 2 by RT PCR: NEGATIVE

## 2022-05-24 LAB — COMPREHENSIVE METABOLIC PANEL
ALT: 16 U/L (ref 0–44)
AST: 20 U/L (ref 15–41)
Albumin: 4.2 g/dL (ref 3.5–5.0)
Alkaline Phosphatase: 101 U/L (ref 38–126)
Anion gap: 13 (ref 5–15)
BUN: 19 mg/dL (ref 8–23)
CO2: 24 mmol/L (ref 22–32)
Calcium: 9.6 mg/dL (ref 8.9–10.3)
Chloride: 102 mmol/L (ref 98–111)
Creatinine, Ser: 0.98 mg/dL (ref 0.44–1.00)
GFR, Estimated: 56 mL/min — ABNORMAL LOW (ref >60)
Glucose, Bld: 131 mg/dL — ABNORMAL HIGH (ref 70–99)
Potassium: 3.3 mmol/L — ABNORMAL LOW (ref 3.5–5.1)
Sodium: 139 mmol/L (ref 135–145)
Total Bilirubin: 0.7 mg/dL (ref 0.3–1.2)
Total Protein: 8.2 g/dL — ABNORMAL HIGH (ref 6.5–8.1)

## 2022-05-24 LAB — LACTIC ACID, PLASMA: Lactic Acid, Venous: 1.1 mmol/L (ref 0.5–1.9)

## 2022-05-24 LAB — URINALYSIS, ROUTINE W REFLEX MICROSCOPIC
Bilirubin Urine: NEGATIVE
Glucose, UA: NEGATIVE mg/dL
Hgb urine dipstick: NEGATIVE
Ketones, ur: 20 mg/dL — AB
Leukocytes,Ua: NEGATIVE
Nitrite: NEGATIVE
Protein, ur: NEGATIVE mg/dL
Specific Gravity, Urine: 1.011 (ref 1.005–1.030)
pH: 8 (ref 5.0–8.0)

## 2022-05-24 MED ORDER — METOPROLOL SUCCINATE ER 50 MG PO TB24
50.0000 mg | ORAL_TABLET | Freq: Two times a day (BID) | ORAL | Status: DC
  Administered 2022-05-24 – 2022-05-26 (×5): 50 mg via ORAL
  Filled 2022-05-24 (×5): qty 1

## 2022-05-24 MED ORDER — AMOXICILLIN 500 MG PO CAPS
500.0000 mg | ORAL_CAPSULE | Freq: Three times a day (TID) | ORAL | Status: DC
  Administered 2022-05-24 – 2022-05-25 (×4): 500 mg via ORAL
  Filled 2022-05-24 (×5): qty 1

## 2022-05-24 MED ORDER — PANTOPRAZOLE SODIUM 40 MG PO TBEC
40.0000 mg | DELAYED_RELEASE_TABLET | Freq: Every day | ORAL | Status: DC
  Administered 2022-05-24 – 2022-05-26 (×3): 40 mg via ORAL
  Filled 2022-05-24 (×3): qty 1

## 2022-05-24 MED ORDER — POTASSIUM CHLORIDE CRYS ER 20 MEQ PO TBCR
40.0000 meq | EXTENDED_RELEASE_TABLET | Freq: Once | ORAL | Status: AC
Start: 2022-05-24 — End: 2022-05-24
  Administered 2022-05-24: 40 meq via ORAL
  Filled 2022-05-24: qty 2

## 2022-05-24 MED ORDER — LACTATED RINGERS IV BOLUS
1000.0000 mL | Freq: Once | INTRAVENOUS | Status: AC
  Administered 2022-05-24: 1000 mL via INTRAVENOUS

## 2022-05-24 MED ORDER — LEVOTHYROXINE SODIUM 75 MCG PO TABS
75.0000 ug | ORAL_TABLET | Freq: Every day | ORAL | Status: DC
  Administered 2022-05-25 – 2022-05-26 (×2): 75 ug via ORAL
  Filled 2022-05-24 (×2): qty 1

## 2022-05-24 MED ORDER — ONDANSETRON HCL 4 MG PO TABS: 4.0000 mg | ORAL_TABLET | Freq: Four times a day (QID) | ORAL | Status: DC | PRN

## 2022-05-24 MED ORDER — ENOXAPARIN SODIUM 40 MG/0.4ML IJ SOSY
40.0000 mg | PREFILLED_SYRINGE | INTRAMUSCULAR | Status: DC
  Administered 2022-05-25 – 2022-05-26 (×2): 40 mg via SUBCUTANEOUS
  Filled 2022-05-24 (×2): qty 0.4

## 2022-05-24 MED ORDER — ACETAMINOPHEN 650 MG RE SUPP: 650.0000 mg | Freq: Four times a day (QID) | RECTAL | Status: DC | PRN

## 2022-05-24 MED ORDER — ENOXAPARIN SODIUM 30 MG/0.3ML IJ SOSY
30.0000 mg | PREFILLED_SYRINGE | INTRAMUSCULAR | Status: DC
Start: 2022-05-24 — End: 2022-05-24
  Administered 2022-05-24: 30 mg via SUBCUTANEOUS
  Filled 2022-05-24: qty 0.3

## 2022-05-24 MED ORDER — ALPRAZOLAM 0.25 MG PO TABS: 0.2500 mg | ORAL_TABLET | Freq: Two times a day (BID) | ORAL | Status: DC | PRN

## 2022-05-24 MED ORDER — SIMVASTATIN 20 MG PO TABS: 20.0000 mg | ORAL_TABLET | Freq: Every day | ORAL | Status: DC

## 2022-05-24 MED ORDER — ASPIRIN 81 MG PO TBEC
81.0000 mg | DELAYED_RELEASE_TABLET | Freq: Every day | ORAL | Status: DC
  Administered 2022-05-24 – 2022-05-26 (×3): 81 mg via ORAL
  Filled 2022-05-24 (×3): qty 1

## 2022-05-24 MED ORDER — DILTIAZEM HCL ER COATED BEADS 240 MG PO CP24
240.0000 mg | ORAL_CAPSULE | Freq: Every day | ORAL | Status: DC
  Administered 2022-05-24 – 2022-05-26 (×3): 240 mg via ORAL
  Filled 2022-05-24: qty 2
  Filled 2022-05-24 (×2): qty 1

## 2022-05-24 MED ORDER — ONDANSETRON HCL 4 MG/2ML IJ SOLN: 4.0000 mg | Freq: Four times a day (QID) | INTRAMUSCULAR | Status: DC | PRN

## 2022-05-24 MED ORDER — IOHEXOL 300 MG/ML  SOLN
80.0000 mL | Freq: Once | INTRAMUSCULAR | Status: AC | PRN
  Administered 2022-05-24: 80 mL via INTRAVENOUS

## 2022-05-24 MED ORDER — SODIUM CHLORIDE (PF) 0.9 % IJ SOLN
INTRAMUSCULAR | Status: AC
  Filled 2022-05-24: qty 50

## 2022-05-24 MED ORDER — ATORVASTATIN CALCIUM 10 MG PO TABS
10.0000 mg | ORAL_TABLET | Freq: Every day | ORAL | Status: DC
Start: 2022-05-24 — End: 2022-05-26
  Administered 2022-05-24 – 2022-05-26 (×3): 10 mg via ORAL
  Filled 2022-05-24 (×3): qty 1

## 2022-05-24 MED ORDER — ACETAMINOPHEN 325 MG PO TABS
650.0000 mg | ORAL_TABLET | Freq: Four times a day (QID) | ORAL | Status: DC | PRN
  Administered 2022-05-24 – 2022-05-25 (×3): 650 mg via ORAL
  Filled 2022-05-24 (×3): qty 2

## 2022-05-24 MED ORDER — LACTATED RINGERS IV SOLN: INTRAVENOUS | Status: AC

## 2022-05-24 NOTE — Progress Notes (Signed)
The order for simvastatin(Zocor) was changed to an equivalent dose of atorvastatin(Lipitor) due to the potential drug interaction with diltiazem.  When taken in combination with medications that inhibit its metabolism, simvastatin can accumulate which increases the risk of liver toxicity, myopathy, or rhabdomyolysis.  Simvastatin dose should not exceed '10mg'$ /day in patients taking verapamil, diltiazem, fibrates, or niacin >or= 1g/day.    Please consider this potential interaction at discharge.  Fusae Florio A 05/24/2022 8:11 AM

## 2022-05-24 NOTE — H&P (Signed)
History and Physical    Patient: Karina [name] DOB: 1933-04-05 DOA: 05/23/2022 DOS: the patient was seen and examined on 05/24/2022 PCP: Burnard Bunting, MD  Patient coming from: Home  Chief Complaint: Fever, nausea and vomiting x3.  HPI: Karina [name] is a 88 y.o. female with medical history significant of chronic anemia, left knee arthritis, atrophic vaginitis, constipation, depression with anxiety, diverticulosis, dizziness, diplopia, endometriosis, GERD, hyperlipidemia, overactive bladder, osteoporosis, postural dizziness, history of SVT, heart murmur who was treated earlier this year for poly bacterial mandibular osteomyelitis with I&D, 6 weeks of Invanz, followed by chronic amoxicillin treatment who is coming to the emergency department with complaints of fever, chills, malaise, decreased appetite and 3 episodes of nausea/emesis yesterday.  She believes she may have caught "a cold" this past weekend as she has been having rhinorrhea and mild nonproductive cough.  She denied sore throat, wheezing or hemoptysis.  No chest pain, palpitations, diaphoresis, PND, orthopnea or pitting edema of the lower extremities.  No abdominal pain, diarrhea, constipation, melena or hematochezia.  No flank pain, dysuria, frequency or hematuria.  No polyuria, polydipsia, polyphagia or blurred vision.   ED course: Initial vital signs were temperature 101.8 F, pulse 92, respiration 20, BP 166/90 mmHg O2 sat 95% on room air.  The patient received acetaminophen 1000 mg p.o. x1 and 1000 mL of LR bolus.  I added K. Dur 40 mEq p.o. x1.  Lab work: Her urinalysis showed ketonuria 20 mg deciliter but was otherwise normal.  Coronavirus and influenza PCR negative.  CBC with a white count 9.7, hemoglobin 11.4 g/dL platelets 195.  Lactic acid was normal.  CMP showed a potassium of 3.3 mmol/L, but all other electrolytes and renal functions were normal.  LFTs showed a total protein 8.2 g/dL but was otherwise  unremarkable.  Imaging: Portable chest, pelvics x-ray, CT abdomen/pelvis, CT maxillofacial, CT head and cervical spine with no acute findings.  Please see images and full radiology report for further details.   Review of Systems: As mentioned in the history of present illness. All other systems reviewed and are negative.  Past Medical History:  Diagnosis Date   Actinomyces infection 01/29/2022   ANEMIA, CHRONIC 08/13/2006   Arthritis    left knee   Atrophic vaginitis 10/03/2013   CONSTIPATION 01/28/2011   Depression with anxiety 2012-03-23   Husband, [name] died in 2011/08/08 after 26 years of marriage  Lost a 50 year old daughter to cancer    DIVERTICULOSIS, COLON 01/28/2011   Dizziness 03/06/2022   Double vision 01/29/2022   Endometriosis    GERD (gastroesophageal reflux disease)    HTN (hypertension) 03/06/2011   Hypercholesteremia 2012/03/23   Infection due to Enterobacter cloacae 01/29/2022   Lesion of external ear 03/06/2011   Murmur 03/06/2022   OAB (overactive bladder) 12/17/2015   Osteoporosis    Post menopausal problems 03/06/2011   Postural dizziness 01/29/2022   Sinus tachycardia    mild-- retesting   SVT (supraventricular tachycardia) ([place]) 2007   3-4 beats.. Holter   Past Surgical History:  Procedure Laterality Date   ABDOMINAL HYSTERECTOMY     w/ unilateral oophorectomy-bleeding, ovarian cysts, left ovary left in place?   BREAST SURGERY     Implants--Silicone   INCISION AND DRAINAGE ABSCESS Left 01/09/2022   Procedure: INCISION AND DRAINAGE NECK  ABSCESS;  Surgeon: Jason Coop, DO;  Location: [facility];  Service: ENT;  Laterality: Left;   right knee arthroscopy, cleaned     Social  History:  reports that she has never smoked. She has never used smokeless tobacco. She reports that she does not drink alcohol and does not use drugs.  No Known Allergies  Family History  Problem Relation Age of Onset   Stroke Mother    Hypertension Mother    Other  Mother        CHF   Transient ischemic attack Mother    Cancer Mother 37       cervical   Cancer Father        stomach, smoker   Cancer Sister        brain   Lymphoma Brother    Cancer Brother    Stroke Daughter        ? stroke   Mitral valve prolapse Daughter    Irritable bowel syndrome Daughter    Migraines Daughter    Hypertension Son    Other Maternal Grandmother        CHF   Alcohol abuse Brother    Heart attack Brother        smoker   Cancer Paternal Aunt        cancer   Cancer Paternal Uncle        X 2 CA   Heart attack Paternal Uncle     Prior to Admission medications   Medication Sig Start Date End Date Taking? Authorizing Provider  ALPRAZolam Duanne Moron) 0.5 MG tablet Take 1 tablet (0.5 mg total) by mouth as needed for sleep or anxiety. 1/2 to 2 tabs po bid prn for anxiety, insomnia Patient taking differently: Take 0.25 mg by mouth 2 (two) times daily as needed for sleep or anxiety. 10/27/15  Yes Mosie Lukes, MD  amoxicillin (AMOXIL) 500 MG capsule Take 1 capsule (500 mg total) by mouth 3 (three) times daily. To start after finishing IV invanz 01/29/22  Yes Tommy Medal, Lavell Islam, MD  aspirin 81 MG tablet Take 81 mg by mouth daily.   Yes [provider]  Cholecalciferol (VITAMIN D-3 PO) Take 5,000 Units by mouth in the morning and at bedtime.   Yes [provider]  Coenzyme Q10 (CO Q-10) 50 MG CAPS Take 1 capsule by mouth daily.   Yes [provider]  diltiazem (CARDIZEM CD) 240 MG 24 hr capsule TAKE 1 CAPSULE (240 MG TOTAL) BY MOUTH DAILY. 06/07/20  Yes Kathyrn Drown D, NP  levothyroxine (SYNTHROID) 75 MCG tablet Take 75 mcg by mouth daily before breakfast.   Yes [provider]  metoprolol succinate (TOPROL-XL) 50 MG 24 hr tablet Take 50 mg by mouth 2 (two) times daily.   Yes [provider]  Multiple Vitamins-Minerals (ALIVE WOMENS 50+) TABS Take 1 tablet by mouth daily. 12/23/16  Yes [provider]  pantoprazole  (PROTONIX) 40 MG tablet Take 40 mg by mouth daily.   Yes [provider]  simvastatin (ZOCOR) 20 MG tablet Take 20 mg by mouth daily.   Yes [provider]  acetaminophen (TYLENOL) 325 MG tablet Take 2 tablets (650 mg total) by mouth every 6 (six) hours as needed for mild pain (or Fever >/= 101). Patient not taking: Reported on 05/24/2022 01/16/22   Raiford Noble Latif, DO  meloxicam (MOBIC) 15 MG tablet Take 15 mg by mouth daily. Patient not taking: Reported on 05/24/2022 04/20/22   [provider]  ondansetron (ZOFRAN) 4 MG tablet Take 1 tablet (4 mg total) by mouth every 6 (six) hours as needed for nausea. Patient not taking: Reported  on 05/07/2022 01/16/22   Raiford Noble Latif, DO  saccharomyces boulardii (FLORASTOR) 250 MG capsule Take 1 capsule (250 mg total) by mouth 3 (three) times daily. Patient not taking: Reported on 05/24/2022 01/16/22   Raiford Noble Latif, DO  traMADol (ULTRAM) 50 MG tablet Take 1 tablet (50 mg total) by mouth every 12 (twelve) hours as needed. Patient not taking: Reported on 05/07/2022 01/16/22 01/16/23  Kerney Elbe, DO    Physical Exam: Vitals:   05/24/22 0447 05/24/22 0500 05/24/22 0700 05/24/22 0730  BP:  129/73 140/77 130/75  Pulse:  85 82 78  Resp:  (!) 24 (!) 24 20  Temp: 98.5 F (36.9 C)     TempSrc: Oral     SpO2:  92% 94% 93%  Weight:      Height:       Physical Exam Vitals and nursing note reviewed.  Constitutional:      General: She is awake.     Appearance: Normal appearance. She is normal weight. She is not ill-appearing.  HENT:     Head: Normocephalic.     Right Ear: Decreased hearing noted.     Left Ear: Decreased hearing noted.     Ears:     Comments: The patient needs to read lips to compensate for decreased hearing.    Mouth/Throat:     Mouth: Mucous membranes are moist.  Eyes:     General: No scleral icterus.    Pupils: Pupils are equal, round, and reactive to light.  Neck:     Vascular: No JVD.   Cardiovascular:     Rate and Rhythm: Normal rate and regular rhythm.     Heart sounds: S1 normal and S2 normal.  Pulmonary:     Effort: Pulmonary effort is normal.     Breath sounds: Normal breath sounds.  Abdominal:     General: Bowel sounds are normal. There is no distension.     Palpations: Abdomen is soft.     Tenderness: There is no abdominal tenderness. There is no right CVA tenderness or guarding.  Musculoskeletal:     Cervical back: Neck supple.     Right lower leg: No edema.     Left lower leg: No edema.  Skin:    General: Skin is warm and dry.  Neurological:     General: No focal deficit present.     Mental Status: She is alert and oriented to person, place, and time. Mental status is at baseline.  Psychiatric:        Mood and Affect: Mood normal.        Behavior: Behavior normal. Behavior is cooperative.   Data Reviewed:  Results are pending, will review when available.  Assessment and Plan: Principal Problem:   Fever Suspicious symptoms for viral syndrome. Observation/MedSurg. Continue IV fluids. Follow CBC and chemistry. Follow blood culture and sensitivity. Case discussed with ID on-call Netta Cedars). No further recommendations per ID.  Active Problems:   Actinomyces infection Imaging negative. Continue amoxicillin 500 mg twice daily..    Hyperlipidemia Simvastatin switched to atorvastatin by pharmacy. Simvastatin may impair clearance of diltiazem. Will need atorvastatin prescription on discharge.    HTN (hypertension) Continue diltiazem 240 mg p.o. daily. Continue metoprolol 50 mg p.o. twice daily    Depression with anxiety Continue alprazolam as needed. Follow-up with primary care provider.    SVT (supraventricular tachycardia) (HCC) Continue CCB and beta-blocker. Follow-up with cardiology as scheduled.    Hypothyroidism Continue levothyroxine 75 mcg  p.o. daily.    Normocytic anemia Monitor hematocrit and hemoglobin.     Advance  Care Planning:   Code Status: Full Code   Consults:   Family Communication: Her sister was at bedside.  Severity of Illness: The appropriate patient status for this patient is OBSERVATION. Observation status is judged to be reasonable and necessary in order to provide the required intensity of service to ensure the patient's safety. The patient's presenting symptoms, physical exam findings, and initial radiographic and laboratory data in the context of their medical condition is felt to place them at decreased risk for further clinical deterioration. Furthermore, it is anticipated that the patient will be medically stable for discharge from the hospital within 2 midnights of admission.   Author: Reubin Milan, MD 05/24/2022 7:43 AM  For on call review [email or web address].   This document was prepared using Dragon voice recognition software and may contain some unintended transcription errors.

## 2022-05-24 NOTE — Plan of Care (Signed)
  Problem: Education: Goal: Knowledge of General Education information will improve Description Including pain rating scale, medication(s)/side effects and non-pharmacologic comfort measures Outcome: Progressing   Problem: Health Behavior/Discharge Planning: Goal: Ability to manage health-related needs will improve Outcome: Progressing   

## 2022-05-25 DIAGNOSIS — R509 Fever, unspecified: Secondary | ICD-10-CM | POA: Diagnosis present

## 2022-05-25 DIAGNOSIS — F419 Anxiety disorder, unspecified: Secondary | ICD-10-CM | POA: Diagnosis present

## 2022-05-25 DIAGNOSIS — E876 Hypokalemia: Secondary | ICD-10-CM | POA: Diagnosis not present

## 2022-05-25 DIAGNOSIS — A429 Actinomycosis, unspecified: Secondary | ICD-10-CM | POA: Diagnosis present

## 2022-05-25 DIAGNOSIS — E7800 Pure hypercholesterolemia, unspecified: Secondary | ICD-10-CM | POA: Diagnosis present

## 2022-05-25 DIAGNOSIS — E039 Hypothyroidism, unspecified: Secondary | ICD-10-CM | POA: Diagnosis present

## 2022-05-25 DIAGNOSIS — Z7982 Long term (current) use of aspirin: Secondary | ICD-10-CM | POA: Diagnosis not present

## 2022-05-25 DIAGNOSIS — Z79890 Hormone replacement therapy (postmenopausal): Secondary | ICD-10-CM | POA: Diagnosis not present

## 2022-05-25 DIAGNOSIS — Z79899 Other long term (current) drug therapy: Secondary | ICD-10-CM | POA: Diagnosis not present

## 2022-05-25 DIAGNOSIS — N390 Urinary tract infection, site not specified: Secondary | ICD-10-CM | POA: Diagnosis present

## 2022-05-25 DIAGNOSIS — I119 Hypertensive heart disease without heart failure: Secondary | ICD-10-CM | POA: Diagnosis present

## 2022-05-25 DIAGNOSIS — I471 Supraventricular tachycardia: Secondary | ICD-10-CM | POA: Diagnosis present

## 2022-05-25 DIAGNOSIS — Z8249 Family history of ischemic heart disease and other diseases of the circulatory system: Secondary | ICD-10-CM | POA: Diagnosis not present

## 2022-05-25 DIAGNOSIS — Z1611 Resistance to penicillins: Secondary | ICD-10-CM | POA: Diagnosis present

## 2022-05-25 DIAGNOSIS — B9620 Unspecified Escherichia coli [E. coli] as the cause of diseases classified elsewhere: Secondary | ICD-10-CM | POA: Diagnosis present

## 2022-05-25 DIAGNOSIS — M810 Age-related osteoporosis without current pathological fracture: Secondary | ICD-10-CM | POA: Diagnosis present

## 2022-05-25 DIAGNOSIS — Z811 Family history of alcohol abuse and dependence: Secondary | ICD-10-CM | POA: Diagnosis not present

## 2022-05-25 DIAGNOSIS — K219 Gastro-esophageal reflux disease without esophagitis: Secondary | ICD-10-CM | POA: Diagnosis present

## 2022-05-25 DIAGNOSIS — Z823 Family history of stroke: Secondary | ICD-10-CM | POA: Diagnosis not present

## 2022-05-25 DIAGNOSIS — Z20822 Contact with and (suspected) exposure to covid-19: Secondary | ICD-10-CM | POA: Diagnosis present

## 2022-05-25 DIAGNOSIS — D649 Anemia, unspecified: Secondary | ICD-10-CM | POA: Diagnosis present

## 2022-05-25 DIAGNOSIS — Z807 Family history of other malignant neoplasms of lymphoid, hematopoietic and related tissues: Secondary | ICD-10-CM | POA: Diagnosis not present

## 2022-05-25 LAB — BASIC METABOLIC PANEL
Anion gap: 9 (ref 5–15)
BUN: 15 mg/dL (ref 8–23)
CO2: 26 mmol/L (ref 22–32)
Calcium: 9.4 mg/dL (ref 8.9–10.3)
Chloride: 103 mmol/L (ref 98–111)
Creatinine, Ser: 0.96 mg/dL (ref 0.44–1.00)
GFR, Estimated: 58 mL/min — ABNORMAL LOW (ref >60)
Glucose, Bld: 97 mg/dL (ref 70–99)
Potassium: 3.8 mmol/L (ref 3.5–5.1)
Sodium: 138 mmol/L (ref 135–145)

## 2022-05-25 LAB — CBC
HCT: 36.5 % (ref 36.0–46.0)
Hemoglobin: 11.6 g/dL — ABNORMAL LOW (ref 12.0–15.0)
MCH: 27.7 pg (ref 26.0–34.0)
MCHC: 31.8 g/dL (ref 30.0–36.0)
MCV: 87.1 fL (ref 80.0–100.0)
Platelets: 203 10*3/uL (ref 150–400)
RBC: 4.19 MIL/uL (ref 3.87–5.11)
RDW: 14 % (ref 11.5–15.5)
WBC: 8 10*3/uL (ref 4.0–10.5)
nRBC: 0 % (ref 0.0–0.2)

## 2022-05-25 MED ORDER — SODIUM CHLORIDE 0.9 % IV SOLN
2.0000 g | INTRAVENOUS | Status: DC
  Administered 2022-05-25 – 2022-05-26 (×2): 2 g via INTRAVENOUS
  Filled 2022-05-25 (×2): qty 20

## 2022-05-25 NOTE — Evaluation (Signed)
Occupational Therapy Evaluation Patient Details Name: Karina [name] MRN: [id number] DOB: Aug 27, 1933 Today's Date: 05/25/2022   History of Present Illness Karina [name] is a 88 y.o. female with medical history significant of chronic anemia, left knee arthritis, atrophic vaginitis, constipation, depression with anxiety, diverticulosis, dizziness, diplopia, endometriosis, GERD, hyperlipidemia, overactive bladder, osteoporosis, postural dizziness, history of SVT, heart murmur who was treated earlier this year for poly bacterial mandibular osteomyelitis and who now presents with fever from UTI.   Clinical Impression   Karina [name] is an 88 year old woman admitted with above medical history. On evaluation she demonstrates independence with ADLs and functional mobility. She had no overt loss of balance when ambulating in hall without a device to assess her functional endurance. Patient has no OT needs.       Recommendations for follow up therapy are one component of a multi-disciplinary discharge planning process, led by the attending physician.  Recommendations may be updated based on patient status, additional functional criteria and insurance authorization.   Follow Up Recommendations  No OT follow up    Assistance Recommended at Discharge None  Patient can return home with the following      Functional Status Assessment  Patient has not had a recent decline in their functional status  Equipment Recommendations  None recommended by OT    Recommendations for Other Services       Precautions / Restrictions Precautions Precautions: None Restrictions Weight Bearing Restrictions: No      Mobility Bed Mobility Overal bed mobility: Independent                  Transfers Overall transfer level: Independent                 General transfer comment: Ambulated in hall without a device.      Balance Overall balance assessment: No apparent balance deficits (not  formally assessed)                                         ADL either performed or assessed with clinical judgement   ADL Overall ADL's : Independent                                       General ADL Comments: Demonstrates ability to perform toileting, LB bathing, donning underwear and managing UB clothing.     Vision Patient Visual Report: No change from baseline       Perception     Praxis      Pertinent Vitals/Pain Pain Assessment Pain Assessment: No/denies pain     Hand Dominance Right   Extremity/Trunk Assessment Upper Extremity Assessment Upper Extremity Assessment: Overall WFL for tasks assessed   Lower Extremity Assessment Lower Extremity Assessment: Overall WFL for tasks assessed   Cervical / Trunk Assessment Cervical / Trunk Assessment: Normal   Communication Communication Communication: HOH   Cognition Arousal/Alertness: Awake/alert Behavior During Therapy: WFL for tasks assessed/performed Overall Cognitive Status: Within Functional Limits for tasks assessed                                       General Comments       Exercises     Shoulder  Instructions      Home Living Family/patient expects to be discharged to:: Private residence Living Arrangements: Alone Available Help at Discharge: Family;Neighbor;Friend(s);Available PRN/intermittently Type of Home: House Home Access: Level entry     Home Layout: One level     Bathroom Shower/Tub: Teacher, early years/pre: Handicapped height     Home Equipment: Conservation officer, nature (2 wheels);Cane - single point;Grab bars - tub/shower   Additional Comments: pt receiving RW from friend, unsure what kind      Prior Functioning/Environment Prior Level of Function : Independent/Modified Independent;History of Falls (last six months);Driving             Mobility Comments: pt with hx of 3 falls in 6 months, reports no use of DME ADLs  Comments: pt reports independence        OT Problem List:        OT Treatment/Interventions:      OT Goals(Current goals can be found in the care plan section) Acute Rehab OT Goals OT Goal Formulation: All assessment and education complete, DC therapy  OT Frequency:      Co-evaluation              AM-PAC OT "6 Clicks" Daily Activity     Outcome Measure Help from another person eating meals?: None Help from another person taking care of personal grooming?: None Help from another person toileting, which includes using toliet, bedpan, or urinal?: None Help from another person bathing (including washing, rinsing, drying)?: None Help from another person to put on and taking off regular upper body clothing?: None Help from another person to put on and taking off regular lower body clothing?: None 6 Click Score: 24   End of Session Nurse Communication: Mobility status  Activity Tolerance: Patient tolerated treatment well Patient left: in chair;with call bell/phone within reach  OT Visit Diagnosis: Muscle weakness (generalized) (M62.81)                Time: 0626-9485 OT Time Calculation (min): 16 min Charges:  OT General Charges $OT Visit: 1 Visit OT Evaluation $OT Eval Low Complexity: 1 Low  [name], OTR/L [facility]  Office [phone] Pager: [phone]   [name] 05/25/2022, 4:21 PM

## 2022-05-25 NOTE — Progress Notes (Signed)
PROGRESS NOTE    Karina [name]  VEH:[id number] DOB: 1933-11-06 DOA: 05/23/2022 PCP: Burnard Bunting, MD   Brief Narrative: 88 year old with past medical history significant for chronic anemia, left knee arthritis, atrophic vaginitis, constipation, depression with anxiety, diverticulosis, diplopia, endometriosis, GERD, hyperlipidemia, overactive bladder, osteoporosis, postural dizziness, history of SVT, heart murmur, recently treated earlier this year for poly bacterial mandibular osteomyelitis with I and D  in 6 weeks of antibiotics, followed by chronic amoxicillin who presents to the ED complaining of fever chills malaise, decreased appetite, 3 episode of nausea and vomiting the day prior to admission. She also reports rhinorrhea and nonproductive cough. Evaluation in the ED patient was found to be febrile with temperature 101.  Urinalysis unrevealing, COVID PCR and influenza negative.  White blood cell 9.7.  Lactic acid normal.  Liver function test unremarkable.  Chest x-ray, pelvic x-ray, CT abdomen and pelvis, CT maxillofacial, CT head and cervical spine no acute finding.     Assessment & Plan:   Principal Problem:   Fever Active Problems:   Hyperlipidemia   HTN (hypertension)   Depression with anxiety   SVT (supraventricular tachycardia) (HCC)   Hypothyroidism   Actinomyces infection   Normocytic anemia  1-Fever: Likely related to UTI, urine growing 100,000 gram negative rods.  -UA negative. Urine culture 100,000 gram negative rods.  -Blood culture; No growth to date.  -SARS Coronavirus negative, Influenza negative.  -Chest x-ray: No acute cardiopulmonary process -CT abdomen and pelvis: No acute intra-abdominal pelvic pathology sigmoid diverticulosis.  No bowel obstruction. -CT maxillofacial: No inflammatory changes seen about the face to suggest acute infection/cellulitis.  No discrete abscess or drainable fluid collection. Plan to add IV ceftriaxone   Fall:  -Pelvis  portable negative -CT head: No acute intracranial abnormality atrophy and chronic small vessel ischemic changes in the white matter. -CT cervical spine: No acute fracture or traumatic subluxation of the cervical spine  Actinomyces infection: CT maxillofacial: Negative for acute infection On  chronic amoxicillin-- hold while on IV ceftriaxone.   Hyperlipidemia: Simvastatin changed to atorvastatin due to interaction with diltiazem  Hypertension: Continue with metoprolol and diltiazem.  Depression, Anxiety: Continue with alprazolam as needed.  SVT;  Continue with Cardizem and metoprolol/  Monitor electrolytes.   Hypothyroidism;  Continue with Synthroid.   Normocytic anemia;  Hb stable.   Hypokalemia; Replaced.   Estimated body mass index is 21.61 kg/m as calculated from the following:   Height as of this encounter: '5\' 3"'$  (1.6 m).   Weight as of this encounter: 55.3 kg.   DVT prophylaxis: Lovenox Code Status: Full code Family Communication: Care discussed with sister who was at bedside Disposition Plan:  Status is: Observation The patient remains OBS appropriate and will d/c before 2 midnights.    Consultants:  None  Procedures:  None  Antimicrobials:  Ceftriaxone  Subjective: She had some nausea and vomiting prior to admission.  No further vomiting since admission.  She did had some dysuria prior to admission.   Objective: Vitals:   05/24/22 1858 05/24/22 2102 05/25/22 0143 05/25/22 0510  BP: (!) 152/68 (!) 143/69 (!) 149/64 (!) 152/80  Pulse: 72 73 72 78  Resp: '18 18 18 18  '$ Temp: 98.5 F (36.9 C) 99 F (37.2 C) 98.9 F (37.2 C) 98.7 F (37.1 C)  TempSrc:  Oral Oral Oral  SpO2: 95% 93% 97% 96%  Weight:      Height:        Intake/Output Summary (Last 24 hours) at 05/25/2022  8185 Last data filed at 05/25/2022 0600 Gross per 24 hour  Intake 1601.35 ml  Output 2900 ml  Net -1298.65 ml   Filed Weights   05/23/22 2257  Weight: 55.3 kg     Examination:  General exam: Appears calm and comfortable  Respiratory system: Clear to auscultation. Respiratory effort normal. Cardiovascular system: S1 & S2 heard, RRR. No JVD, murmurs, rubs, gallops or clicks. No pedal edema. Gastrointestinal system: Abdomen is nondistended, soft and nontender. No organomegaly or masses felt. Normal bowel sounds heard. Central nervous system: Alert and oriented.  Extremities: Symmetric 5 x 5 power.    Data Reviewed: I have personally reviewed following labs and imaging studies  CBC: Recent Labs  Lab 05/23/22 2313  WBC 9.7  NEUTROABS 7.3  HGB 11.4*  HCT 35.4*  MCV 87.0  PLT 631   Basic Metabolic Panel: Recent Labs  Lab 05/23/22 2313  NA 139  K 3.3*  CL 102  CO2 24  GLUCOSE 131*  BUN 19  CREATININE 0.98  CALCIUM 9.6   GFR: Estimated Creatinine Clearance: 34.1 mL/min (by C-G formula based on SCr of 0.98 mg/dL). Liver Function Tests: Recent Labs  Lab 05/23/22 2313  AST 20  ALT 16  ALKPHOS 101  BILITOT 0.7  PROT 8.2*  ALBUMIN 4.2   No results for input(s): "LIPASE", "AMYLASE" in the last 168 hours. No results for input(s): "AMMONIA" in the last 168 hours. Coagulation Profile: No results for input(s): "INR", "PROTIME" in the last 168 hours. Cardiac Enzymes: No results for input(s): "CKTOTAL", "CKMB", "CKMBINDEX", "TROPONINI" in the last 168 hours. BNP (last 3 results) No results for input(s): "PROBNP" in the last 8760 hours. HbA1C: No results for input(s): "HGBA1C" in the last 72 hours. CBG: Recent Labs  Lab 05/23/22 2249  GLUCAP 134*   Lipid Profile: No results for input(s): "CHOL", "HDL", "LDLCALC", "TRIG", "CHOLHDL", "LDLDIRECT" in the last 72 hours. Thyroid Function Tests: No results for input(s): "TSH", "T4TOTAL", "FREET4", "T3FREE", "THYROIDAB" in the last 72 hours. Anemia Panel: No results for input(s): "VITAMINB12", "FOLATE", "FERRITIN", "TIBC", "IRON", "RETICCTPCT" in the last 72 hours. Sepsis  Labs: Recent Labs  Lab 05/24/22 0009  LATICACIDVEN 1.1    Recent Results (from the past 240 hour(s))  Blood culture (routine x 2)     Status: None (Preliminary result)   Collection Time: 05/23/22 11:13 PM   Specimen: BLOOD  Result Value Ref Range Status   Specimen Description   Final    BLOOD LEFT ANTECUBITAL Performed at [facility] [address] [place], [place] [address]    Special Requests   Final    BOTTLES DRAWN AEROBIC AND ANAEROBIC Blood Culture results may not be optimal due to an excessive volume of blood received in culture bottles Performed at [facility] [address], [place], [place] [address]    Culture   Final    NO GROWTH < 12 HOURS Performed at [facility] [address] [place], [place] [address]    Report Status PENDING  Incomplete  Blood culture (routine x 2)     Status: None (Preliminary result)   Collection Time: 05/23/22 11:18 PM   Specimen: BLOOD  Result Value Ref Range Status   Specimen Description   Final    BLOOD LEFT ANTECUBITAL Performed at [facility] [address] [place],  [address]    Special Requests   Final    BOTTLES DRAWN AEROBIC AND ANAEROBIC Blood Culture results may  not be optimal due to an excessive volume of blood received in culture bottles Performed at [facility] [address], [place], [place] [address]    Culture   Final    NO GROWTH < 12 HOURS Performed at [facility] [address] [place], [place] [address]    Report Status PENDING  Incomplete  Resp Panel by RT-PCR (Flu A&B, Covid) Anterior Nasal Swab     Status: None   Collection Time: 05/24/22  1:31 AM   Specimen: Anterior Nasal Swab  Result Value Ref Range Status   SARS Coronavirus 2 by RT PCR NEGATIVE NEGATIVE Final    Comment: (NOTE) SARS-CoV-2 target nucleic acids are NOT DETECTED.  The SARS-CoV-2 RNA is generally detectable in upper  respiratory specimens during the acute phase of infection. The lowest concentration of SARS-CoV-2 viral copies this assay can detect is 138 copies/mL. A negative result does not preclude SARS-Cov-2 infection and should not be used as the sole basis for treatment or other patient management decisions. A negative result may occur with  improper specimen collection/handling, submission of specimen other than nasopharyngeal swab, presence of viral mutation(s) within the areas targeted by this assay, and inadequate number of viral copies(<138 copies/mL). A negative result must be combined with clinical observations, patient history, and epidemiological information. The expected result is Negative.  Fact Sheet for Patients:  EntrepreneurPulse.com.au  Fact Sheet for Healthcare Providers:  IncredibleEmployment.be  This test is no t yet approved or cleared by the [place] FDA and  has been authorized for detection and/or diagnosis of SARS-CoV-2 by FDA under an Emergency Use Authorization (EUA). This EUA will remain  in effect (meaning this test can be used) for the duration of the COVID-19 declaration under Section 564(b)(1) of the Act, 21 U.S.C.section 360bbb-3(b)(1), unless the authorization is terminated  or revoked sooner.       Influenza A by PCR NEGATIVE NEGATIVE Final   Influenza B by PCR NEGATIVE NEGATIVE Final    Comment: (NOTE) The Xpert Xpress SARS-CoV-2/FLU/RSV plus assay is intended as an aid in the diagnosis of influenza from Nasopharyngeal swab specimens and should not be used as a sole basis for treatment. Nasal washings and aspirates are unacceptable for Xpert Xpress SARS-CoV-2/FLU/RSV testing.  Fact Sheet for Patients: EntrepreneurPulse.com.au  Fact Sheet for Healthcare Providers: IncredibleEmployment.be  This test is not yet approved or cleared by the [place] FDA and has been  authorized for detection and/or diagnosis of SARS-CoV-2 by FDA under an Emergency Use Authorization (EUA). This EUA will remain in effect (meaning this test can be used) for the duration of the COVID-19 declaration under Section 564(b)(1) of the Act, 21 U.S.C. section 360bbb-3(b)(1), unless the authorization is terminated or revoked.  Performed at [facility], [place] [address] [place],  [address]          Radiology Studies: CT ABDOMEN PELVIS W CONTRAST  Result Date: 05/24/2022 CLINICAL DATA:  Sepsis. EXAM: CT ABDOMEN AND PELVIS WITH CONTRAST TECHNIQUE: Multidetector CT imaging of the abdomen and pelvis was performed using the standard protocol following bolus administration of intravenous contrast. RADIATION DOSE REDUCTION: This exam was performed according to the departmental dose-optimization program which includes automated exposure control, adjustment of the mA and/or kV according to patient size and/or use of iterative reconstruction technique. CONTRAST:  62m OMNIPAQUE IOHEXOL 300 MG/ML  SOLN COMPARISON:  CT abdomen pelvis dated 02/04/2008. FINDINGS: Evaluation of this exam is limited due to respiratory motion artifact. Lower  chest: Bibasilar subpleural atelectasis. Coronary vascular calcification. No intra-abdominal free air or free fluid. Hepatobiliary: No focal liver abnormality is seen. No gallstones, gallbladder wall thickening, or biliary dilatation. Pancreas: Unremarkable. No pancreatic ductal dilatation or surrounding inflammatory changes. Spleen: Normal in size without focal abnormality. Adrenals/Urinary Tract: The adrenal glands are unremarkable. There is no hydronephrosis on either side. There is a 3 cm left renal interpolar cyst and additional smaller bilateral renal hypodense lesions which are not characterized on this CT. These can be better evaluated with ultrasound on a nonemergent/outpatient basis. There is symmetric enhancement and excretion of  contrast by both kidneys. The visualized ureters and urinary bladder appear unremarkable. Stomach/Bowel: There is sigmoid diverticulosis without active inflammatory changes. There is moderate stool throughout the colon. No bowel obstruction or active inflammation. The appendix is not visualized with certainty. No inflammatory changes identified in the right lower quadrant. Vascular/Lymphatic: Mild aortoiliac atherosclerotic disease. The IVC is unremarkable. No portal venous gas. There is no adenopathy. Reproductive: Hysterectomy. A 5 x 8 cm left adnexal cyst (previously measured 3.5 x 6.0 cm on CT of 02/04/2008. This is likely a benign or indolent process given very slow growth and can be better evaluated with ultrasound on a nonemergent/outpatient basis. Other: None Musculoskeletal: Partially visualized bilateral breast implants. Osteopenia with degenerative changes of the spine. No acute osseous pathology. IMPRESSION: 1. No acute intra-abdominal or pelvic pathology. 2. Sigmoid diverticulosis. No bowel obstruction. 3. Aortic Atherosclerosis (ICD10-I70.0). Electronically Signed   By: Anner Crete M.D.   On: 05/24/2022 02:39   CT Maxillofacial W Contrast  Result Date: 05/24/2022 CLINICAL DATA:  Initial evaluation for weakness, fever, history of prior abscess drainage at angle of left mandible. EXAM: CT MAXILLOFACIAL WITH CONTRAST TECHNIQUE: Multidetector CT imaging of the maxillofacial structures was performed with intravenous contrast. Multiplanar CT image reconstructions were also generated. RADIATION DOSE REDUCTION: This exam was performed according to the departmental dose-optimization program which includes automated exposure control, adjustment of the mA and/or kV according to patient size and/or use of iterative reconstruction technique. CONTRAST:  55m OMNIPAQUE IOHEXOL 300 MG/ML  SOLN COMPARISON:  Prior CT from 01/09/2022. FINDINGS: Osseous: No acute osseous finding. Patient is edentulous.  Osteoarthritic changes noted about the C1-2 articulation. Orbits: Globes and orbital soft tissues within normal limits. No evidence for postseptal or intraorbital cellulitis. Sinuses: Mild scattered mucoperiosteal thickening present about the ethmoidal air cells and maxillary sinuses. Paranasal sinuses are otherwise largely clear. Mastoid air cells and middle ear cavities are well pneumatized and free of fluid. Soft tissues: No significant soft tissue swelling or inflammatory stranding seen about the face. No discrete abscess or drainable fluid collection. Salivary glands including the parotid and submandibular glands are within normal limits. No adenopathy within the visualized upper neck. Oral cavity, oropharynx, and nasopharynx within normal limits. Retropharyngeal soft tissues within normal limits. Negative epiglottis. Vallecula clear. Vascular calcifications noted about the carotid bifurcations. Limited intracranial: Unremarkable. IMPRESSION: Negative maxillofacial CT with no inflammatory changes seen about the face to suggest acute infection/cellulitis. No discrete abscess or drainable fluid collection. Electronically Signed   By: BJeannine BogaM.D.   On: 05/24/2022 02:22   CT Cervical Spine Wo Contrast  Result Date: 05/23/2022 CLINICAL DATA:  Compression fracture. EXAM: CT CERVICAL SPINE WITHOUT CONTRAST TECHNIQUE: Multidetector CT imaging of the cervical spine was performed without intravenous contrast. Multiplanar CT image reconstructions were also generated. RADIATION DOSE REDUCTION: This exam was performed according to the departmental dose-optimization program which includes automated exposure control, adjustment of the  mA and/or kV according to patient size and/or use of iterative reconstruction technique. COMPARISON:  None. FINDINGS: Alignment: Normal. Skull base and vertebrae: No acute fracture. No primary bone lesion or focal pathologic process. Soft tissues and spinal canal: No  prevertebral fluid or swelling. No visible canal hematoma. Disc levels: No significant central canal or neural foraminal stenosis at any level. Upper chest: There is scarring in the lung apices. Other: None. IMPRESSION: No acute fracture or traumatic subluxation of the cervical spine. Electronically Signed   By: Ronney Asters M.D.   On: 05/23/2022 23:51   CT Head Wo Contrast  Result Date: 05/23/2022 CLINICAL DATA:  Head trauma EXAM: CT HEAD WITHOUT CONTRAST TECHNIQUE: Contiguous axial images were obtained from the base of the skull through the vertex without intravenous contrast. RADIATION DOSE REDUCTION: This exam was performed according to the departmental dose-optimization program which includes automated exposure control, adjustment of the mA and/or kV according to patient size and/or use of iterative reconstruction technique. COMPARISON:  CT brain 11/02/2013 FINDINGS: Brain: No acute territorial infarction, hemorrhage or intracranial mass. Mild to moderate atrophy. Moderate chronic small vessel ischemic changes of the white matter. Stable ventricle size. Vascular: No hyperdense vessels.  Carotid vascular calcification Skull: Normal. Negative for fracture or focal lesion. Sinuses/Orbits: Mucosal thickening in the sinuses Other: None IMPRESSION: 1. No CT evidence for acute intracranial abnormality. 2. Atrophy and chronic small vessel ischemic changes of the white matter Electronically Signed   By: Donavan Foil M.D.   On: 05/23/2022 23:49   DG Pelvis Portable  Result Date: 05/23/2022 CLINICAL DATA:  Fall, fever. EXAM: PORTABLE PELVIS 1-2 VIEWS COMPARISON:  None Available. FINDINGS: There is no evidence of pelvic fracture or diastasis. No pelvic bone lesions are seen. IMPRESSION: Negative. Electronically Signed   By: Ronney Asters M.D.   On: 05/23/2022 23:35   DG Chest Portable 1 View  Result Date: 05/23/2022 CLINICAL DATA:  Fall. EXAM: PORTABLE CHEST 1 VIEW COMPARISON:  Chest x-ray 01/09/2022  FINDINGS: Cardiomediastinal silhouette appears stable. The aorta is ectatic in the heart is enlarged. There is no focal lung infiltrate, pleural effusion or pneumothorax identified. Right apical pleural-parenchymal thickening is unchanged. Peripherally calcified breast implants are present. No acute fractures are seen. IMPRESSION: No acute cardiopulmonary process.  No significant interval change. Electronically Signed   By: Ronney Asters M.D.   On: 05/23/2022 23:34        Scheduled Meds:  amoxicillin  500 mg Oral TID   aspirin EC  81 mg Oral Daily   atorvastatin  10 mg Oral Daily   diltiazem  240 mg Oral Daily   enoxaparin (LOVENOX) injection  40 mg Subcutaneous Q24H   levothyroxine  75 mcg Oral QAC breakfast   metoprolol succinate  50 mg Oral BID   pantoprazole  40 mg Oral Daily   Continuous Infusions:  lactated ringers 75 mL/hr at 05/24/22 2348     LOS: 0 days    Time spent: 35 minutes    Jaelen Gellerman A Deneisha Dade, MD [facility]   If 7PM-7AM, please contact night-coverage [email or web address]  05/25/2022, 7:28 AM

## 2022-05-26 DIAGNOSIS — R509 Fever, unspecified: Secondary | ICD-10-CM | POA: Diagnosis not present

## 2022-05-26 LAB — URINE CULTURE: Culture: >=100000 — AB

## 2022-05-26 MED ORDER — CEFDINIR 300 MG PO CAPS
300.0000 mg | ORAL_CAPSULE | Freq: Two times a day (BID) | ORAL | 0 refills | Status: AC
Start: 2022-05-27 — End: 2022-05-29

## 2022-05-26 MED ORDER — ATORVASTATIN CALCIUM 10 MG PO TABS: 10.0000 mg | ORAL_TABLET | Freq: Every day | ORAL | 0 refills | Status: DC

## 2022-05-26 MED ORDER — SACCHAROMYCES BOULARDII 250 MG PO CAPS
250.0000 mg | ORAL_CAPSULE | Freq: Three times a day (TID) | ORAL | 0 refills | Status: AC
Start: 2022-05-26 — End: ?

## 2022-05-26 NOTE — Plan of Care (Signed)
  Problem: Clinical Measurements: Goal: Ability to maintain clinical measurements within normal limits will improve Outcome: Progressing   Problem: Activity: Goal: Risk for activity intolerance will decrease Outcome: Progressing   Problem: Coping: Goal: Level of anxiety will decrease Outcome: Progressing   

## 2022-05-26 NOTE — Discharge Summary (Signed)
Physician Discharge Summary   Patient: Karina [name] MRN: [id number] DOB: 11/25/1932  Admit date:     05/23/2022  Discharge date: 05/26/22  Discharge Physician: Elmarie Shiley   PCP: Burnard Bunting, MD   Recommendations at discharge:    Needs to complete 2 days of cefdinir.  Continue ampicillin for actinomyces.    Discharge Diagnoses: Principal Problem:   Fever Active Problems:   Hyperlipidemia   HTN (hypertension)   Depression with anxiety   SVT (supraventricular tachycardia) (HCC)   Hypothyroidism   Actinomyces infection   Normocytic anemia  Resolved Problems:   * No resolved hospital problems. *  Hospital Course: 88 year old with past medical history significant for chronic anemia, left knee arthritis, atrophic vaginitis, constipation, depression with anxiety, diverticulosis, diplopia, endometriosis, GERD, hyperlipidemia, overactive bladder, osteoporosis, postural dizziness, history of SVT, heart murmur, recently treated earlier this year for poly bacterial mandibular osteomyelitis with I and D  in 6 weeks of antibiotics, followed by chronic amoxicillin who presents to the ED complaining of fever chills malaise, decreased appetite, 3 episode of nausea and vomiting the day prior to admission. She also reports rhinorrhea and nonproductive cough. Evaluation in the ED patient was found to be febrile with temperature 101.  Urinalysis unrevealing, COVID PCR and influenza negative.  White blood cell 9.7.  Lactic acid normal.  Liver function test unremarkable.   Chest x-ray, pelvic x-ray, CT abdomen and pelvis, CT maxillofacial, CT head and cervical spine no acute finding.   Patient was found to have UTI, Urine grew E coli, resistant to Ampicillin. Sensitive to ceftriaxone. She will be discharge on 2 days of Cefdinir.   Assessment and Plan: 1-UTI, E coli.  Fever: Likely related to UTI, culture grew 100,000 E. coli  -UA negative. Urine culture 100,000 E coli  -Blood  culture; No growth to date.  -SARS Coronavirus negative, Influenza negative.  -Chest x-ray: No acute cardiopulmonary process -CT abdomen and pelvis: No acute intra-abdominal pelvic pathology sigmoid diverticulosis.  No bowel obstruction. -CT maxillofacial: No inflammatory changes seen about the face to suggest acute infection/cellulitis.  No discrete abscess or drainable fluid collection. Received 2 days of IV ceftriaxone. She will complete 2 more days of cefdinir.    Fall:  -Pelvis portable negative -CT head: No acute intracranial abnormality atrophy and chronic small vessel ischemic changes in the white matter. -CT cervical spine: No acute fracture or traumatic subluxation of the cervical spine   Actinomyces infection: CT maxillofacial: Negative for acute infection On  chronic amoxicillin-- held while on IV ceftriaxone.  She will be discharge on cefdinir for E coli UTI. Cefdinir wont cover actinomyces. Discussed with ID ok to continue ampicillin while on cefdinir because is short course.    Hyperlipidemia: Simvastatin changed to atorvastatin due to interaction with diltiazem   Hypertension: Continue with metoprolol and diltiazem.   Depression, Anxiety: Continue with alprazolam as needed.   SVT;  Continue with Cardizem and metoprolol/  Monitor electrolytes.    Hypothyroidism;  Continue with Synthroid.    Normocytic anemia;  Hb stable.    Hypokalemia; Replaced.    Estimated body mass index is 21.61 kg/m as calculated from the following:   Height as of this encounter: '5\' 3"'$  (1.6 m).   Weight as of this encounter: 55.3 kg.         Consultants: None Procedures performed: None Disposition: Home Diet recommendation:  Cardiac diet DISCHARGE MEDICATION: Allergies as of 05/26/2022   No Known Allergies      Medication  List     STOP taking these medications    meloxicam 15 MG tablet Commonly known as: MOBIC   ondansetron 4 MG tablet Commonly known as: ZOFRAN    simvastatin 20 MG tablet Commonly known as: ZOCOR   traMADol 50 MG tablet Commonly known as: Ultram       TAKE these medications    acetaminophen 325 MG tablet Commonly known as: TYLENOL Take 2 tablets (650 mg total) by mouth every 6 (six) hours as needed for mild pain (or Fever >/= 101).   Alive Womens 50+ Tabs Take 1 tablet by mouth daily.   ALPRAZolam 0.5 MG tablet Commonly known as: XANAX Take 1 tablet (0.5 mg total) by mouth as needed for sleep or anxiety. 1/2 to 2 tabs po bid prn for anxiety, insomnia What changed:  how much to take when to take this additional instructions   amoxicillin 500 MG capsule Commonly known as: AMOXIL Take 1 capsule (500 mg total) by mouth 3 (three) times daily. To start after finishing IV invanz   aspirin 81 MG tablet Take 81 mg by mouth daily.   atorvastatin 10 MG tablet Commonly known as: LIPITOR Take 1 tablet (10 mg total) by mouth daily. Start taking on: May 27, 2022   cefdinir 300 MG capsule Commonly known as: OMNICEF Take 1 capsule (300 mg total) by mouth 2 (two) times daily for 2 days. Start taking on: May 27, 2022   Co Q-10 50 MG Caps Take 1 capsule by mouth daily.   diltiazem 240 MG 24 hr capsule Commonly known as: CARDIZEM CD TAKE 1 CAPSULE (240 MG TOTAL) BY MOUTH DAILY.   levothyroxine 75 MCG tablet Commonly known as: SYNTHROID Take 75 mcg by mouth daily before breakfast.   metoprolol succinate 50 MG 24 hr tablet Commonly known as: TOPROL-XL Take 50 mg by mouth 2 (two) times daily.   pantoprazole 40 MG tablet Commonly known as: PROTONIX Take 40 mg by mouth daily.   saccharomyces boulardii 250 MG capsule Commonly known as: FLORASTOR Take 1 capsule (250 mg total) by mouth 3 (three) times daily.   VITAMIN D-3 PO Take 5,000 Units by mouth in the morning and at bedtime.        Discharge Exam: Filed Weights   05/23/22 2257  Weight: 55.3 kg   General; NAD Lungs; CTA  Condition at discharge:  good  The results of significant diagnostics from this hospitalization (including imaging, microbiology, ancillary and laboratory) are listed below for reference.   Imaging Studies: CT ABDOMEN PELVIS W CONTRAST  Result Date: 05/24/2022 CLINICAL DATA:  Sepsis. EXAM: CT ABDOMEN AND PELVIS WITH CONTRAST TECHNIQUE: Multidetector CT imaging of the abdomen and pelvis was performed using the standard protocol following bolus administration of intravenous contrast. RADIATION DOSE REDUCTION: This exam was performed according to the departmental dose-optimization program which includes automated exposure control, adjustment of the mA and/or kV according to patient size and/or use of iterative reconstruction technique. CONTRAST:  68m OMNIPAQUE IOHEXOL 300 MG/ML  SOLN COMPARISON:  CT abdomen pelvis dated 02/04/2008. FINDINGS: Evaluation of this exam is limited due to respiratory motion artifact. Lower chest: Bibasilar subpleural atelectasis. Coronary vascular calcification. No intra-abdominal free air or free fluid. Hepatobiliary: No focal liver abnormality is seen. No gallstones, gallbladder wall thickening, or biliary dilatation. Pancreas: Unremarkable. No pancreatic ductal dilatation or surrounding inflammatory changes. Spleen: Normal in size without focal abnormality. Adrenals/Urinary Tract: The adrenal glands are unremarkable. There is no hydronephrosis on either side. There is a 3  cm left renal interpolar cyst and additional smaller bilateral renal hypodense lesions which are not characterized on this CT. These can be better evaluated with ultrasound on a nonemergent/outpatient basis. There is symmetric enhancement and excretion of contrast by both kidneys. The visualized ureters and urinary bladder appear unremarkable. Stomach/Bowel: There is sigmoid diverticulosis without active inflammatory changes. There is moderate stool throughout the colon. No bowel obstruction or active inflammation. The appendix is not  visualized with certainty. No inflammatory changes identified in the right lower quadrant. Vascular/Lymphatic: Mild aortoiliac atherosclerotic disease. The IVC is unremarkable. No portal venous gas. There is no adenopathy. Reproductive: Hysterectomy. A 5 x 8 cm left adnexal cyst (previously measured 3.5 x 6.0 cm on CT of 02/04/2008. This is likely a benign or indolent process given very slow growth and can be better evaluated with ultrasound on a nonemergent/outpatient basis. Other: None Musculoskeletal: Partially visualized bilateral breast implants. Osteopenia with degenerative changes of the spine. No acute osseous pathology. IMPRESSION: 1. No acute intra-abdominal or pelvic pathology. 2. Sigmoid diverticulosis. No bowel obstruction. 3. Aortic Atherosclerosis (ICD10-I70.0). Electronically Signed   By: Anner Crete M.D.   On: 05/24/2022 02:39   CT Maxillofacial W Contrast  Result Date: 05/24/2022 CLINICAL DATA:  Initial evaluation for weakness, fever, history of prior abscess drainage at angle of left mandible. EXAM: CT MAXILLOFACIAL WITH CONTRAST TECHNIQUE: Multidetector CT imaging of the maxillofacial structures was performed with intravenous contrast. Multiplanar CT image reconstructions were also generated. RADIATION DOSE REDUCTION: This exam was performed according to the departmental dose-optimization program which includes automated exposure control, adjustment of the mA and/or kV according to patient size and/or use of iterative reconstruction technique. CONTRAST:  46m OMNIPAQUE IOHEXOL 300 MG/ML  SOLN COMPARISON:  Prior CT from 01/09/2022. FINDINGS: Osseous: No acute osseous finding. Patient is edentulous. Osteoarthritic changes noted about the C1-2 articulation. Orbits: Globes and orbital soft tissues within normal limits. No evidence for postseptal or intraorbital cellulitis. Sinuses: Mild scattered mucoperiosteal thickening present about the ethmoidal air cells and maxillary sinuses. Paranasal  sinuses are otherwise largely clear. Mastoid air cells and middle ear cavities are well pneumatized and free of fluid. Soft tissues: No significant soft tissue swelling or inflammatory stranding seen about the face. No discrete abscess or drainable fluid collection. Salivary glands including the parotid and submandibular glands are within normal limits. No adenopathy within the visualized upper neck. Oral cavity, oropharynx, and nasopharynx within normal limits. Retropharyngeal soft tissues within normal limits. Negative epiglottis. Vallecula clear. Vascular calcifications noted about the carotid bifurcations. Limited intracranial: Unremarkable. IMPRESSION: Negative maxillofacial CT with no inflammatory changes seen about the face to suggest acute infection/cellulitis. No discrete abscess or drainable fluid collection. Electronically Signed   By: BJeannine BogaM.D.   On: 05/24/2022 02:22   CT Cervical Spine Wo Contrast  Result Date: 05/23/2022 CLINICAL DATA:  Compression fracture. EXAM: CT CERVICAL SPINE WITHOUT CONTRAST TECHNIQUE: Multidetector CT imaging of the cervical spine was performed without intravenous contrast. Multiplanar CT image reconstructions were also generated. RADIATION DOSE REDUCTION: This exam was performed according to the departmental dose-optimization program which includes automated exposure control, adjustment of the mA and/or kV according to patient size and/or use of iterative reconstruction technique. COMPARISON:  None. FINDINGS: Alignment: Normal. Skull base and vertebrae: No acute fracture. No primary bone lesion or focal pathologic process. Soft tissues and spinal canal: No prevertebral fluid or swelling. No visible canal hematoma. Disc levels: No significant central canal or neural foraminal stenosis at any level. Upper chest: There  is scarring in the lung apices. Other: None. IMPRESSION: No acute fracture or traumatic subluxation of the cervical spine. Electronically  Signed   By: Ronney Asters M.D.   On: 05/23/2022 23:51   CT Head Wo Contrast  Result Date: 05/23/2022 CLINICAL DATA:  Head trauma EXAM: CT HEAD WITHOUT CONTRAST TECHNIQUE: Contiguous axial images were obtained from the base of the skull through the vertex without intravenous contrast. RADIATION DOSE REDUCTION: This exam was performed according to the departmental dose-optimization program which includes automated exposure control, adjustment of the mA and/or kV according to patient size and/or use of iterative reconstruction technique. COMPARISON:  CT brain 11/02/2013 FINDINGS: Brain: No acute territorial infarction, hemorrhage or intracranial mass. Mild to moderate atrophy. Moderate chronic small vessel ischemic changes of the white matter. Stable ventricle size. Vascular: No hyperdense vessels.  Carotid vascular calcification Skull: Normal. Negative for fracture or focal lesion. Sinuses/Orbits: Mucosal thickening in the sinuses Other: None IMPRESSION: 1. No CT evidence for acute intracranial abnormality. 2. Atrophy and chronic small vessel ischemic changes of the white matter Electronically Signed   By: Donavan Foil M.D.   On: 05/23/2022 23:49   DG Pelvis Portable  Result Date: 05/23/2022 CLINICAL DATA:  Fall, fever. EXAM: PORTABLE PELVIS 1-2 VIEWS COMPARISON:  None Available. FINDINGS: There is no evidence of pelvic fracture or diastasis. No pelvic bone lesions are seen. IMPRESSION: Negative. Electronically Signed   By: Ronney Asters M.D.   On: 05/23/2022 23:35   DG Chest Portable 1 View  Result Date: 05/23/2022 CLINICAL DATA:  Fall. EXAM: PORTABLE CHEST 1 VIEW COMPARISON:  Chest x-ray 01/09/2022 FINDINGS: Cardiomediastinal silhouette appears stable. The aorta is ectatic in the heart is enlarged. There is no focal lung infiltrate, pleural effusion or pneumothorax identified. Right apical pleural-parenchymal thickening is unchanged. Peripherally calcified breast implants are present. No acute  fractures are seen. IMPRESSION: No acute cardiopulmonary process.  No significant interval change. Electronically Signed   By: Ronney Asters M.D.   On: 05/23/2022 23:34    Microbiology: Results for orders placed or performed during the hospital encounter of 05/23/22  Blood culture (routine x 2)     Status: None (Preliminary result)   Collection Time: 05/23/22 11:13 PM   Specimen: BLOOD  Result Value Ref Range Status   Specimen Description   Final    BLOOD LEFT ANTECUBITAL Performed at [facility] [address] [place], [place] [address]    Special Requests   Final    BOTTLES DRAWN AEROBIC AND ANAEROBIC Blood Culture results may not be optimal due to an excessive volume of blood received in culture bottles Performed at [facility] [address] [place], [place] [address]    Culture   Final    NO GROWTH 2 DAYS Performed at [facility] [address] [place], [place] [address]    Report Status PENDING  Incomplete  Urine Culture     Status: Abnormal   Collection Time: 05/23/22 11:13 PM   Specimen: Urine, Clean Catch  Result Value Ref Range Status   Specimen Description   Final    URINE, CLEAN CATCH Performed at [facility], [place] [address], [place], [place] [address]    Special Requests   Final    NONE Performed at [facility], [place] [address] [place], [place] [address]    Culture >=100,000 COLONIES/mL ESCHERICHIA COLI (A)  Final   Report Status 05/26/2022 FINAL  Final   Organism  ID, Bacteria ESCHERICHIA COLI (A)  Final      Susceptibility   Escherichia coli - MIC*    AMPICILLIN >=32 RESISTANT Resistant     CEFAZOLIN 32 INTERMEDIATE Intermediate     CEFTRIAXONE <=0.25 SENSITIVE Sensitive     CIPROFLOXACIN <=0.25 SENSITIVE Sensitive     GENTAMICIN <=1 SENSITIVE Sensitive     IMIPENEM <=0.25 SENSITIVE Sensitive     NITROFURANTOIN 64 INTERMEDIATE Intermediate     TRIMETH/SULFA <=20  SENSITIVE Sensitive     AMPICILLIN/SULBACTAM >=32 RESISTANT Resistant     PIP/TAZO >=128 RESISTANT Resistant     * >=100,000 COLONIES/mL ESCHERICHIA COLI  Blood culture (routine x 2)     Status: None (Preliminary result)   Collection Time: 05/23/22 11:18 PM   Specimen: BLOOD  Result Value Ref Range Status   Specimen Description   Final    BLOOD LEFT ANTECUBITAL Performed at [facility] [address], [place], [place] [address]    Special Requests   Final    BOTTLES DRAWN AEROBIC AND ANAEROBIC Blood Culture results may not be optimal due to an excessive volume of blood received in culture bottles Performed at [facility] [address] [place], [place] [address]    Culture   Final    NO GROWTH 2 DAYS Performed at [facility] [address], [place], [place] [address]    Report Status PENDING  Incomplete  Resp Panel by RT-PCR (Flu A&B, Covid) Anterior Nasal Swab     Status: None   Collection Time: 05/24/22  1:31 AM   Specimen: Anterior Nasal Swab  Result Value Ref Range Status   SARS Coronavirus 2 by RT PCR NEGATIVE NEGATIVE Final    Comment: (NOTE) SARS-CoV-2 target nucleic acids are NOT DETECTED.  The SARS-CoV-2 RNA is generally detectable in upper respiratory specimens during the acute phase of infection. The lowest concentration of SARS-CoV-2 viral copies this assay can detect is 138 copies/mL. A negative result does not preclude SARS-Cov-2 infection and should not be used as the sole basis for treatment or other patient management decisions. A negative result may occur with  improper specimen collection/handling, submission of specimen other than nasopharyngeal swab, presence of viral mutation(s) within the areas targeted by this assay, and inadequate number of viral copies(<138 copies/mL). A negative result must be combined with clinical observations, patient history, and epidemiological information. The expected result  is Negative.  Fact Sheet for Patients:  EntrepreneurPulse.com.au  Fact Sheet for Healthcare Providers:  IncredibleEmployment.be  This test is no t yet approved or cleared by the [place] FDA and  has been authorized for detection and/or diagnosis of SARS-CoV-2 by FDA under an Emergency Use Authorization (EUA). This EUA will remain  in effect (meaning this test can be used) for the duration of the COVID-19 declaration under Section 564(b)(1) of the Act, 21 U.S.C.section 360bbb-3(b)(1), unless the authorization is terminated  or revoked sooner.       Influenza A by PCR NEGATIVE NEGATIVE Final   Influenza B by PCR NEGATIVE NEGATIVE Final    Comment: (NOTE) The Xpert Xpress SARS-CoV-2/FLU/RSV plus assay is intended as an aid in the diagnosis of influenza from Nasopharyngeal swab specimens and should not be used as a sole basis for treatment. Nasal washings and aspirates are unacceptable for Xpert Xpress SARS-CoV-2/FLU/RSV testing.  Fact Sheet for Patients: EntrepreneurPulse.com.au  Fact Sheet for Healthcare Providers: IncredibleEmployment.be  This test is not yet approved or cleared by the [place] FDA  and has been authorized for detection and/or diagnosis of SARS-CoV-2 by FDA under an Emergency Use Authorization (EUA). This EUA will remain in effect (meaning this test can be used) for the duration of the COVID-19 declaration under Section 564(b)(1) of the Act, 21 U.S.C. section 360bbb-3(b)(1), unless the authorization is terminated or revoked.  Performed at [facility], [place] [address], [place], [place] [address]     Labs: CBC: Recent Labs  Lab 05/23/22 2313 05/25/22 0906  WBC 9.7 8.0  NEUTROABS 7.3  --   HGB 11.4* 11.6*  HCT 35.4* 36.5  MCV 87.0 87.1  PLT 195 838   Basic Metabolic Panel: Recent Labs  Lab 05/23/22 2313 05/25/22 0906  NA 139 138  K 3.3* 3.8   CL 102 103  CO2 24 26  GLUCOSE 131* 97  BUN 19 15  CREATININE 0.98 0.96  CALCIUM 9.6 9.4   Liver Function Tests: Recent Labs  Lab 05/23/22 2313  AST 20  ALT 16  ALKPHOS 101  BILITOT 0.7  PROT 8.2*  ALBUMIN 4.2   CBG: Recent Labs  Lab 05/23/22 2249  GLUCAP 134*    Discharge time spent: greater than 30 minutes.  Signed: Elmarie Shiley, MD [facility] 05/26/2022

## 2022-05-26 NOTE — Evaluation (Signed)
Physical Therapy Evaluation Patient Details Name: Karina [name] MRN: [id number] DOB: [birth year] Today's Date: 05/26/2022  History of Present Illness  Karina [name] is a 88 y.o. female with medical history significant of chronic anemia, left knee arthritis, atrophic vaginitis, constipation, depression with anxiety, diverticulosis, dizziness, diplopia, endometriosis, GERD, hyperlipidemia, overactive bladder, osteoporosis, postural dizziness, history of SVT, heart murmur who was treated earlier this year for poly bacterial mandibular osteomyelitis and who now presents with fever from UTI.  Clinical Impression  Pt admitted as above and currently able to demonstrate Independence in all basic mobility tasks including ambulating 500' and including stepping fwd, bkwd, sideways and turning 360 degrees with good safety awareness and no LOB.  Pt states she feels close to baseline with mobility and with no PT needs at this time.  PT service will sign off at this time.     Recommendations for follow up therapy are one component of a multi-disciplinary discharge planning process, led by the attending physician.  Recommendations may be updated based on patient status, additional functional criteria and insurance authorization.  Follow Up Recommendations No PT follow up      Assistance Recommended at Discharge None  Patient can return home with the following       Equipment Recommendations None recommended by PT  Recommendations for Other Services       Functional Status Assessment Patient has not had a recent decline in their functional status     Precautions / Restrictions Precautions Precautions: None Restrictions Weight Bearing Restrictions: No      Mobility  Bed Mobility Overal bed mobility: Modified Independent             General bed mobility comments: no physical assist    Transfers Overall transfer level: Modified independent Equipment used: None                General transfer comment: no physical assist    Ambulation/Gait Ambulation/Gait assistance: Supervision, Independent Gait Distance (Feet): 500 Feet Assistive device: None Gait Pattern/deviations: Step-through pattern, Shuffle, Wide base of support Gait velocity: mod pace     General Gait Details: slightly widened BOS with initial mild instability - no LOB  Stairs            Wheelchair Mobility    Modified Rankin (Stroke Patients Only)       Balance Overall balance assessment: Mild deficits observed, not formally tested                                           Pertinent Vitals/Pain Pain Assessment Pain Assessment: No/denies pain    Home Living Family/patient expects to be discharged to:: Private residence Living Arrangements: Alone Available Help at Discharge: Family;Neighbor;Friend(s);Available PRN/intermittently Type of Home: House Home Access: Level entry       Home Layout: One level Home Equipment: Conservation officer, nature (2 wheels);Cane - single point;Grab bars - tub/shower Additional Comments: pt reports recieved RW on last hospital admit but used it only a few times    Prior Function Prior Level of Function : Independent/Modified Independent;History of Falls (last six months);Driving             Mobility Comments: pt reports no recent falls at home - prior chart entry lists 3 falls in 6 months ADLs Comments: pt reports independence     Hand Dominance   Dominant Hand: Right  Extremity/Trunk Assessment   Upper Extremity Assessment Upper Extremity Assessment: Overall WFL for tasks assessed    Lower Extremity Assessment Lower Extremity Assessment: Overall WFL for tasks assessed       Communication   Communication: HOH  Cognition Arousal/Alertness: Awake/alert Behavior During Therapy: WFL for tasks assessed/performed Overall Cognitive Status: Within Functional Limits for tasks assessed                                           General Comments      Exercises     Assessment/Plan    PT Assessment Patient needs continued PT services  PT Problem List         PT Treatment Interventions Gait training    PT Goals (Current goals can be found in the Care Plan section)  Acute Rehab PT Goals Patient Stated Goal: HOME PT Goal Formulation: All assessment and education complete, DC therapy    Frequency Min 1X/week     Co-evaluation               AM-PAC PT "6 Clicks" Mobility  Outcome Measure Help needed turning from your back to your side while in a flat bed without using bedrails?: None Help needed moving from lying on your back to sitting on the side of a flat bed without using bedrails?: None Help needed moving to and from a bed to a chair (including a wheelchair)?: None Help needed standing up from a chair using your arms (e.g., wheelchair or bedside chair)?: None Help needed to walk in hospital room?: None Help needed climbing 3-5 steps with a railing? : None 6 Click Score: 24    End of Session Equipment Utilized During Treatment: Gait belt Activity Tolerance: Patient tolerated treatment well Patient left: in chair;with call bell/phone within reach Nurse Communication: Mobility status PT Visit Diagnosis: Difficulty in walking, not elsewhere classified (R26.2)    Time: 4656-8127 PT Time Calculation (min) (ACUTE ONLY): 17 min   Charges:   PT Evaluation $PT Eval Low Complexity: 1 Low          [place] Pager [phone] Office [phone]   Saliou Barnier 05/26/2022, 10:47 AM

## 2022-05-29 LAB — CULTURE, BLOOD (ROUTINE X 2)
Culture: NO GROWTH
Culture: NO GROWTH

## 2022-06-03 DIAGNOSIS — F418 Other specified anxiety disorders: Secondary | ICD-10-CM | POA: Diagnosis not present

## 2022-06-03 DIAGNOSIS — E039 Hypothyroidism, unspecified: Secondary | ICD-10-CM | POA: Diagnosis not present

## 2022-06-03 DIAGNOSIS — E785 Hyperlipidemia, unspecified: Secondary | ICD-10-CM | POA: Diagnosis not present

## 2022-06-03 DIAGNOSIS — D649 Anemia, unspecified: Secondary | ICD-10-CM | POA: Diagnosis not present

## 2022-06-03 DIAGNOSIS — Z87440 Personal history of urinary (tract) infections: Secondary | ICD-10-CM | POA: Diagnosis not present

## 2022-06-03 DIAGNOSIS — I10 Essential (primary) hypertension: Secondary | ICD-10-CM | POA: Diagnosis not present

## 2022-06-03 DIAGNOSIS — N390 Urinary tract infection, site not specified: Secondary | ICD-10-CM | POA: Diagnosis not present

## 2022-06-03 DIAGNOSIS — I471 Supraventricular tachycardia: Secondary | ICD-10-CM | POA: Diagnosis not present

## 2022-06-03 DIAGNOSIS — A429 Actinomycosis, unspecified: Secondary | ICD-10-CM | POA: Diagnosis not present

## 2022-07-22 ENCOUNTER — Ambulatory Visit: Payer: Medicare HMO | Attending: Cardiology | Admitting: Cardiology

## 2022-07-22 ENCOUNTER — Encounter: Payer: Self-pay | Admitting: Cardiology

## 2022-07-22 VITALS — BP 152/90 | HR 97 | Ht 63.0 in | Wt 123.6 lb

## 2022-07-22 DIAGNOSIS — E7800 Pure hypercholesterolemia, unspecified: Secondary | ICD-10-CM

## 2022-07-22 DIAGNOSIS — I6523 Occlusion and stenosis of bilateral carotid arteries: Secondary | ICD-10-CM

## 2022-07-22 DIAGNOSIS — I10 Essential (primary) hypertension: Secondary | ICD-10-CM

## 2022-07-22 DIAGNOSIS — I471 Supraventricular tachycardia: Secondary | ICD-10-CM | POA: Diagnosis not present

## 2022-07-22 MED ORDER — ROSUVASTATIN CALCIUM 10 MG PO TABS: 10.0000 mg | ORAL_TABLET | Freq: Every day | ORAL | 3 refills | Status: DC

## 2022-07-22 NOTE — Progress Notes (Signed)
Cardiology Office Note:    Date:  07/22/2022   ID:  Karina [name], DOB 09-13-1933, MRN [id number]  PCP:  Burnard Bunting, MD  [facility] Cardiologist:  Candee Furbish, MD  [facility] Electrophysiologist:  None   Referring MD: Burnard Bunting, MD     History of Present Illness:    Karina [name] is a 88 y.o. female here for the follow-up of paroxysmal atrial tachycardia, palpitations, and the evaluation of irregular heartbeat and fatigue. Former patient of Dr. Ron [name].  Normal LV.  Stress test in 2004 was normal.  Previously described a fluttering sensation in her chest.  Sometimes lasting 30 seconds to 1 minute.  No family history of CAD.  Today, noted Sunday 20 minutes beating fast. Makes her feel like insides are trembling. Sits down.   Heart beating fast today again she states.  She is taking both metoprolol as well as diltiazem.  No chest pain no shortness of breath.  Has had prior hospitalization secondary to osteomyelitis of jaw.  Taking amoxicillin until 2024 April.   Past Medical History:  Diagnosis Date   Actinomyces infection 01/29/2022   ANEMIA, CHRONIC 08/13/2006   Arthritis    left knee   Atrophic vaginitis 10/03/2013   CONSTIPATION 01/28/2011   Depression with anxiety 2012-03-26   Husband, [name] died in 2011-08-11 after 85 years of marriage  Lost a 58 year old daughter to cancer    DIVERTICULOSIS, COLON 01/28/2011   Dizziness 03/06/2022   Double vision 01/29/2022   Endometriosis    GERD (gastroesophageal reflux disease)    HTN (hypertension) 03/06/2011   Hypercholesteremia Mar 26, 2012   Infection due to Enterobacter cloacae 01/29/2022   Lesion of external ear 03/06/2011   Murmur 03/06/2022   OAB (overactive bladder) 12/17/2015   Osteoporosis    Post menopausal problems 03/06/2011   Postural dizziness 01/29/2022   Sinus tachycardia    mild-- retesting   SVT (supraventricular tachycardia) ([place]) 2007   3-4 beats.. Holter    Past Surgical  History:  Procedure Laterality Date   ABDOMINAL HYSTERECTOMY     w/ unilateral oophorectomy-bleeding, ovarian cysts, left ovary left in place?   BREAST SURGERY     Implants--Silicone   INCISION AND DRAINAGE ABSCESS Left 01/09/2022   Procedure: INCISION AND DRAINAGE NECK  ABSCESS;  Surgeon: Jason Coop, DO;  Location: [place];  Service: ENT;  Laterality: Left;   right knee arthroscopy, cleaned      Current Medications: Current Meds  Medication Sig   acetaminophen (TYLENOL) 325 MG tablet Take 2 tablets (650 mg total) by mouth every 6 (six) hours as needed for mild pain (or Fever >/= 101).   ALPRAZolam (XANAX) 0.5 MG tablet Take 1 tablet (0.5 mg total) by mouth as needed for sleep or anxiety. 1/2 to 2 tabs po bid prn for anxiety, insomnia   amoxicillin (AMOXIL) 500 MG capsule Take 1 capsule (500 mg total) by mouth 3 (three) times daily. To start after finishing IV invanz   aspirin 81 MG tablet Take 81 mg by mouth daily.   Cholecalciferol (VITAMIN D-3 PO) Take 5,000 Units by mouth in the morning and at bedtime.   Coenzyme Q10 (CO Q-10) 50 MG CAPS Take 1 capsule by mouth daily.   diltiazem (CARDIZEM CD) 240 MG 24 hr capsule TAKE 1 CAPSULE (240 MG TOTAL) BY MOUTH DAILY.   levothyroxine (SYNTHROID) 75 MCG tablet Take 75 mcg by mouth daily before breakfast.   metoprolol succinate (TOPROL-XL) 50 MG 24  hr tablet Take 50 mg by mouth 2 (two) times daily.   Multiple Vitamins-Minerals (ALIVE WOMENS 50+) TABS Take 1 tablet by mouth daily.   pantoprazole (PROTONIX) 40 MG tablet Take 40 mg by mouth daily.   rosuvastatin (CRESTOR) 10 MG tablet Take 1 tablet (10 mg total) by mouth daily.   saccharomyces boulardii (FLORASTOR) 250 MG capsule Take 1 capsule (250 mg total) by mouth 3 (three) times daily.   [DISCONTINUED] simvastatin (ZOCOR) 10 MG tablet Take 10 mg by mouth daily.     Allergies:   Patient has no known allergies.   Social History   Socioeconomic History   Marital status: Widowed     Spouse name: Not on file   Number of children: Not on file   Years of education: Not on file   Highest education level: Not on file  Occupational History   Not on file  Tobacco Use   Smoking status: Never   Smokeless tobacco: Never  Substance and Sexual Activity   Alcohol use: No    Alcohol/week: 0.0 standard drinks of alcohol   Drug use: No   Sexual activity: Never    Partners: Male    Birth control/protection: Surgical    Comment: TAH--Poss. left ovary remains  Other Topics Concern   Not on file  Social History Narrative   Not on file   Social Determinants of Health   Financial Resource Strain: Not on file  Food Insecurity: Not on file  Transportation Needs: Not on file  Physical Activity: Not on file  Stress: Not on file  Social Connections: Not on file     Family History: The patient's family history includes Alcohol abuse in her brother; Cancer in her brother, father, paternal aunt, paternal uncle, and sister; Cancer (age of onset: 60) in her mother; Heart attack in her brother and paternal uncle; Hypertension in her mother and son; Irritable bowel syndrome in her daughter; Lymphoma in her brother; Migraines in her daughter; Mitral valve prolapse in her daughter; Other in her maternal grandmother and mother; Stroke in her daughter and mother; Transient ischemic attack in her mother.  ROS:   Please see the history of present illness.    (+) All other systems reviewed and are negative.  EKGs/Labs/Other Studies Reviewed:    US Carotid Duplex 03/09/2020: Summary:  Right Carotid: Velocities in the right ICA are consistent with a 1-39%  stenosis.   Left Carotid: Velocities in the left ICA are consistent with a 1-39%  stenosis.   Vertebrals:  Bilateral vertebral arteries demonstrate antegrade flow.  Subclavians: Normal flow hemodynamics were seen in bilateral subclavian arteries.   Echo 06/01/2019:  1. The left ventricle has normal systolic function with an ejection   fraction of 60-65%. The cavity size was normal. Left ventricular diastolic  Doppler parameters are consistent with impaired relaxation. No evidence of  left ventricular regional wall  motion abnormalities.   2. The right ventricle has normal systolic function. The cavity was  normal. There is no increase in right ventricular wall thickness.   3. There is mild mitral annular calcification present.   4. The aortic valve is tricuspid. Aortic valve regurgitation is mild by  color flow Doppler.   Holter 2020:  Brief episodes of paroxysmal atrial tachycardia noted on monitor. No pauses. No atrial fibrillation. Occasional PVCs, occasionally in bigeminal pattern. Predominant rhythm sinus. Overall reassuring.   Paroxysmal atrial tachycardia and PVCs responsible for sensation of palpitations.  EKG: EKG is personally reviewed and interpreted.  07/10/2021: Sinus rhythm 64 with no other abnormalities.  Normal intervals. 03/16/2020: sinus rhythm 67  Recent Labs: 05/23/2022: ALT 16 05/25/2022: BUN 15; Creatinine, Ser 0.96; Hemoglobin 11.6; Platelets 203; Potassium 3.8; Sodium 138  Recent Lipid Panel    Component Value Date/Time   CHOL 206 (H) 06/10/2016 1405   TRIG 187.0 (H) 06/10/2016 1405   HDL 65.80 06/10/2016 1405   CHOLHDL 3 06/10/2016 1405   VLDL 37.4 06/10/2016 1405   LDLCALC 103 (H) 06/10/2016 1405   LDLDIRECT 156.5 03/10/2012 0921     Risk Assessment/Calculations:       Physical Exam:    VS:  BP (!) 152/90   Pulse 97   Ht '5\' 3"'$  (1.6 m)   Wt 123 lb 9.6 oz (56.1 kg)   SpO2 98%   BMI 21.89 kg/m     Wt Readings from Last 3 Encounters:  07/22/22 123 lb 9.6 oz (56.1 kg)  05/23/22 122 lb (55.3 kg)  05/07/22 122 lb (55.3 kg)     GEN: Well nourished, well developed in no acute distress HEENT: Normal NECK: No JVD; No carotid bruits LYMPHATICS: No lymphadenopathy CARDIAC: Regular rate and rhythm, no murmurs, rubs, gallops RESPIRATORY:  Clear to auscultation without rales,  wheezing or rhonchi  ABDOMEN: Soft, non-tender, non-distended MUSCULOSKELETAL:  No edema; No deformity  SKIN: Warm and dry NEUROLOGIC:  Alert and oriented x 3 PSYCHIATRIC:  Normal affect     ASSESSMENT:    1. Paroxysmal atrial tachycardia ([place])   2. SVT (supraventricular tachycardia) ([place])   3. Bilateral carotid artery stenosis   4. Pure hypercholesterolemia   5. Primary hypertension      PLAN:    In order of problems listed above:  SVT (supraventricular tachycardia) ([place]) Occasionally will have episodes of "racing " fluttering like feeling.    I told her that sitting down as well as drinking additional water can be helpful.  She can if it prolongs take an additional metoprolol if needed.  She will let me know if this is getting worse.  Previous Holter monitor in 2020 was reviewed.  There were no evidence of atrial fibrillation.  Continue with diltiazem 240 mg long-acting as well as Toprol 50 mg for medical management.  Refills as needed. Stable.  Recommend continuing these medications.     Carotid artery disease (HCC) Mild carotid artery plaque bilaterally.  Previous scan reviewed.  I will switch her simvastatin over to Crestor 10 mg once a day for improved control prior LDL 117.  Also would be nice to avoid interactions with diltiazem.  She is on aspirin 81 mg.  No bleeding.     HTN (hypertension) Sometimes elevated in the doctor's office.  Today doing well.  No changes made.  Medications reviewed.   Hyperlipidemia Changing to Crestor 10 mg a day.  Prior LDL 138.  Would be optimal for her LDL to be less than 70.  No interactions with diltiazem in the situation.   Osteomyelitis Long-term antibiotic use, jaw.  1 year follow-up  Medication Adjustments/Labs and Tests Ordered: Current medicines are reviewed at length with the patient today.  Concerns regarding medicines are outlined above.  No orders of the defined types were placed in this encounter.   Meds ordered this  encounter  Medications   rosuvastatin (CRESTOR) 10 MG tablet    Sig: Take 1 tablet (10 mg total) by mouth daily.    Dispense:  90 tablet    Refill:  3     Patient Instructions  Medication Instructions:  Please discontinue your Simvastatin and start Crestor 10 mg a day. Continue all other medications as listed.  *If you need a refill on your cardiac medications before your next appointment, please call your pharmacy*  Follow-Up: At [facility], you and your health needs are our priority.  As part of our continuing mission to provide you with exceptional heart care, we have created designated Provider Care Teams.  These Care Teams include your primary Cardiologist (physician) and Advanced Practice Providers (APPs -  Physician Assistants and Nurse Practitioners) who all work together to provide you with the care you need, when you need it.  We recommend signing up for the patient portal called "MyChart".  Sign up information is provided on this After Visit Summary.  MyChart is used to connect with patients for Virtual Visits (Telemedicine).  Patients are able to view lab/test results, encounter notes, upcoming appointments, etc.  Non-urgent messages can be sent to your provider as well.   To learn more about what you can do with MyChart, go to [email or web address].    Your next appointment:   1 year(s)  The format for your next appointment:   In Person  Provider:   Candee Furbish, MD      Important Information About Sugar          Signed, Candee Furbish, MD  07/22/2022 4:57 PM    [place]

## 2022-07-22 NOTE — Patient Instructions (Signed)
Medication Instructions:  Please discontinue your Simvastatin and start Crestor 10 mg a day. Continue all other medications as listed.  *If you need a refill on your cardiac medications before your next appointment, please call your pharmacy*  Follow-Up: At [name], you and your health needs are our priority.  As part of our continuing mission to provide you with exceptional heart care, we have created designated Provider Care Teams.  These Care Teams include your primary Cardiologist (physician) and Advanced Practice Providers (APPs -  Physician Assistants and Nurse Practitioners) who all work together to provide you with the care you need, when you need it.  We recommend signing up for the patient portal called "MyChart".  Sign up information is provided on this After Visit Summary.  MyChart is used to connect with patients for Virtual Visits (Telemedicine).  Patients are able to view lab/test results, encounter notes, upcoming appointments, etc.  Non-urgent messages can be sent to your provider as well.   To learn more about what you can do with MyChart, go to [email or web address].    Your next appointment:   1 year(s)  The format for your next appointment:   In Person  Provider:   Candee Furbish, MD      Important Information About Sugar

## 2022-07-24 DIAGNOSIS — H524 Presbyopia: Secondary | ICD-10-CM | POA: Diagnosis not present

## 2022-07-24 DIAGNOSIS — H52209 Unspecified astigmatism, unspecified eye: Secondary | ICD-10-CM | POA: Diagnosis not present

## 2022-07-24 DIAGNOSIS — H5213 Myopia, bilateral: Secondary | ICD-10-CM | POA: Diagnosis not present

## 2022-07-24 DIAGNOSIS — H5203 Hypermetropia, bilateral: Secondary | ICD-10-CM | POA: Diagnosis not present

## 2022-09-02 ENCOUNTER — Ambulatory Visit: Payer: Medicare HMO | Admitting: Infectious Disease

## 2022-09-09 ENCOUNTER — Other Ambulatory Visit: Payer: Self-pay

## 2022-09-09 ENCOUNTER — Ambulatory Visit: Payer: Medicare HMO | Admitting: Infectious Disease

## 2022-09-09 ENCOUNTER — Encounter: Payer: Self-pay | Admitting: Infectious Disease

## 2022-09-09 ENCOUNTER — Ambulatory Visit: Payer: Medicare HMO

## 2022-09-09 VITALS — BP 150/86 | HR 73 | Resp 16 | Ht 63.0 in | Wt 125.0 lb

## 2022-09-09 DIAGNOSIS — M272 Inflammatory conditions of jaws: Secondary | ICD-10-CM

## 2022-09-09 DIAGNOSIS — A498 Other bacterial infections of unspecified site: Secondary | ICD-10-CM

## 2022-09-09 DIAGNOSIS — Z7185 Encounter for immunization safety counseling: Secondary | ICD-10-CM | POA: Diagnosis not present

## 2022-09-09 DIAGNOSIS — A429 Actinomycosis, unspecified: Secondary | ICD-10-CM | POA: Diagnosis not present

## 2022-09-09 DIAGNOSIS — Z23 Encounter for immunization: Secondary | ICD-10-CM | POA: Diagnosis not present

## 2022-09-09 HISTORY — DX: Encounter for immunization safety counseling: Z71.85

## 2022-09-09 NOTE — Progress Notes (Signed)
Subjective:   Chief complaint: Follow-up for osteomyelitis on amoxicillin    Patient ID: Karina [name], female    DOB: 10-Dec-1932, 88 y.o.   MRN: [id number]  HPI  88 y.o. female with neck abscess and mandibular osteomyelitis status post I&D of abscess by ear nose and throat surgery.  I saw her in the inpatient service and she initially had Enterobacter cloacae growing from cultures but later and actinomyces species grew.  She was continued on with ertapenem and she had repeat CT scan that continue to show improvement in her pathology.  She has follow-up with ear nose and throat on February 07, 2022.  Since discharge from the hospital she has been having some intermittent dizziness particular when she stands up quickly.  She also has at times seen double.  We did check orthostatic vitals last time and she was not orthostatic.  She continues to have episodic dizziness can happen at any moment in time but then goes away.  When she finished ertapenem the dizziness actually resolved and she has been tolerating amoxicillin without any difficulty.  We are going to try to push through March 1 or roughly thereabouts which would be a year anniversary from her surgery.          Past Medical History:  Diagnosis Date   Actinomyces infection 01/29/2022   ANEMIA, CHRONIC 08/13/2006   Arthritis    left knee   Atrophic vaginitis 10/03/2013   CONSTIPATION 01/28/2011   Depression with anxiety 03/22/2012   Husband, [name] died in 08-06-2011 after 12 years of marriage  Lost a 39 year old daughter to cancer    DIVERTICULOSIS, COLON 01/28/2011   Dizziness 03/06/2022   Double vision 01/29/2022   Endometriosis    GERD (gastroesophageal reflux disease)    HTN (hypertension) 03/06/2011   Hypercholesteremia 2012/03/22   Infection due to Enterobacter cloacae 01/29/2022   Lesion of external ear 03/06/2011   Murmur 03/06/2022   OAB (overactive bladder) 12/17/2015   Osteoporosis    Post  menopausal problems 03/06/2011   Postural dizziness 01/29/2022   Sinus tachycardia    mild-- retesting   SVT (supraventricular tachycardia) ([place]) 2007   3-4 beats.. Holter    Past Surgical History:  Procedure Laterality Date   ABDOMINAL HYSTERECTOMY     w/ unilateral oophorectomy-bleeding, ovarian cysts, left ovary left in place?   BREAST SURGERY     Implants--Silicone   INCISION AND DRAINAGE ABSCESS Left 01/09/2022   Procedure: INCISION AND DRAINAGE NECK  ABSCESS;  Surgeon: Jason Coop, DO;  Location: [facility] OR;  Service: ENT;  Laterality: Left;   right knee arthroscopy, cleaned      Family History  Problem Relation Age of Onset   Stroke Mother    Hypertension Mother    Other Mother        CHF   Transient ischemic attack Mother    Cancer Mother 10       cervical   Cancer Father        stomach, smoker   Cancer Sister        brain   Lymphoma Brother    Cancer Brother    Stroke Daughter        ? stroke   Mitral valve prolapse Daughter    Irritable bowel syndrome Daughter    Migraines Daughter    Hypertension Son    Other Maternal Grandmother        CHF   Alcohol abuse Brother  Heart attack Brother        smoker   Cancer Paternal Aunt        cancer   Cancer Paternal Uncle        X 2 CA   Heart attack Paternal Uncle       Social History   Socioeconomic History   Marital status: Widowed    Spouse name: Not on file   Number of children: Not on file   Years of education: Not on file   Highest education level: Not on file  Occupational History   Not on file  Tobacco Use   Smoking status: Never   Smokeless tobacco: Never  Substance and Sexual Activity   Alcohol use: No    Alcohol/week: 0.0 standard drinks of alcohol   Drug use: No   Sexual activity: Never    Partners: Male    Birth control/protection: Surgical    Comment: TAH--Poss. left ovary remains  Other Topics Concern   Not on file  Social History Narrative   Not on file   Social  Determinants of Health   Financial Resource Strain: Not on file  Food Insecurity: Not on file  Transportation Needs: Not on file  Physical Activity: Not on file  Stress: Not on file  Social Connections: Not on file    No Known Allergies   Current Outpatient Medications:    acetaminophen (TYLENOL) 325 MG tablet, Take 2 tablets (650 mg total) by mouth every 6 (six) hours as needed for mild pain (or Fever >/= 101)., Disp: 20 tablet, Rfl: 0   ALPRAZolam (XANAX) 0.5 MG tablet, Take 1 tablet (0.5 mg total) by mouth as needed for sleep or anxiety. 1/2 to 2 tabs po bid prn for anxiety, insomnia, Disp: 90 tablet, Rfl: 0   amoxicillin (AMOXIL) 500 MG capsule, Take 1 capsule (500 mg total) by mouth 3 (three) times daily. To start after finishing IV invanz, Disp: 90 capsule, Rfl: 11   aspirin 81 MG tablet, Take 81 mg by mouth daily., Disp: , Rfl:    Cholecalciferol (VITAMIN D-3 PO), Take 5,000 Units by mouth in the morning and at bedtime., Disp: , Rfl:    Coenzyme Q10 (CO Q-10) 50 MG CAPS, Take 1 capsule by mouth daily., Disp: , Rfl:    diltiazem (CARDIZEM CD) 240 MG 24 hr capsule, TAKE 1 CAPSULE (240 MG TOTAL) BY MOUTH DAILY., Disp: 90 capsule, Rfl: 2   levothyroxine (SYNTHROID) 75 MCG tablet, Take 75 mcg by mouth daily before breakfast., Disp: , Rfl:    metoprolol succinate (TOPROL-XL) 50 MG 24 hr tablet, Take 50 mg by mouth 2 (two) times daily., Disp: , Rfl:    Multiple Vitamins-Minerals (ALIVE WOMENS 50+) TABS, Take 1 tablet by mouth daily., Disp: , Rfl:    pantoprazole (PROTONIX) 40 MG tablet, Take 40 mg by mouth daily., Disp: , Rfl:    rosuvastatin (CRESTOR) 10 MG tablet, Take 1 tablet (10 mg total) by mouth daily., Disp: 90 tablet, Rfl: 3   saccharomyces boulardii (FLORASTOR) 250 MG capsule, Take 1 capsule (250 mg total) by mouth 3 (three) times daily., Disp: 180 capsule, Rfl: 0   Review of Systems  Constitutional:  Negative for activity change, appetite change, chills, diaphoresis, fatigue,  fever and unexpected weight change.  HENT:  Negative for congestion, rhinorrhea, sinus pressure, sneezing, sore throat and trouble swallowing.   Eyes:  Negative for photophobia and visual disturbance.  Respiratory:  Negative for cough, chest tightness, shortness of breath, wheezing and  stridor.   Cardiovascular:  Negative for chest pain, palpitations and leg swelling.  Gastrointestinal:  Negative for abdominal distention, abdominal pain, anal bleeding, blood in stool, constipation, diarrhea, nausea and vomiting.  Genitourinary:  Negative for difficulty urinating, dysuria, flank pain and hematuria.  Musculoskeletal:  Negative for arthralgias, back pain, gait problem, joint swelling and myalgias.  Skin:  Negative for color change, pallor, rash and wound.  Neurological:  Negative for dizziness, tremors, weakness and light-headedness.  Hematological:  Negative for adenopathy. Does not bruise/bleed easily.  Psychiatric/Behavioral:  Negative for agitation, behavioral problems, confusion, decreased concentration, dysphoric mood and sleep disturbance.        Objective:   Physical Exam Constitutional:      General: She is not in acute distress.    Appearance: She is not diaphoretic.  HENT:     Head: Normocephalic and atraumatic.     Right Ear: External ear normal.     Left Ear: External ear normal.     Nose: Nose normal.     Mouth/Throat:     Pharynx: No oropharyngeal exudate.  Eyes:     General: No scleral icterus.       Right eye: No discharge.        Left eye: No discharge.     Extraocular Movements: Extraocular movements intact.     Conjunctiva/sclera: Conjunctivae normal.  Cardiovascular:     Rate and Rhythm: Normal rate and regular rhythm.  Pulmonary:     Effort: Pulmonary effort is normal. No respiratory distress.     Breath sounds: No wheezing or rales.  Abdominal:     General: There is no distension.     Palpations: Abdomen is soft.     Tenderness: There is no rebound.   Musculoskeletal:        General: No tenderness. Normal range of motion.     Cervical back: Normal range of motion and neck supple.  Lymphadenopathy:     Cervical: No cervical adenopathy.  Skin:    General: Skin is warm and dry.     Coloration: Skin is not jaundiced or pale.     Findings: No erythema, lesion or rash.  Neurological:     General: No focal deficit present.     Mental Status: She is alert and oriented to person, place, and time.     Coordination: Coordination normal.  Psychiatric:        Mood and Affect: Mood normal.        Behavior: Behavior normal.        Thought Content: Thought content normal.        Judgment: Judgment normal.    Angle of jaw 05/07/2022:     Exam is stable    Assessment & Plan:   Polymicrobial mandibular osteomyelitis with Enterobacter and actinomyces having been isolated:  We gave her 6 weeks of ertapenem and have changed her over to amoxicillin to target the actinomyces.  I will plan on checking a sed rate CBC BMP and CBC with differential  We will endeavor to get her through a years worth of antibiotics and then if her inflammatory markers and her overall condition is reassuring observe her off antibiotics and have her come back to see [place] roughly 2 months after stopping them.     Vaccine counseling: recommended up dated COVID 19 and annual flu shot which she accepted.

## 2022-09-10 LAB — CBC WITH DIFFERENTIAL/PLATELET
Absolute Monocytes: 585 cells/uL (ref 200–950)
Basophils Absolute: 68 cells/uL (ref 0–200)
Basophils Relative: 1 %
Eosinophils Absolute: 122 cells/uL (ref 15–500)
Eosinophils Relative: 1.8 %
HCT: 37.6 % (ref 35.0–45.0)
Hemoglobin: 12.3 g/dL (ref 11.7–15.5)
Lymphs Abs: 2475 cells/uL (ref 850–3900)
MCH: 27.7 pg (ref 27.0–33.0)
MCHC: 32.7 g/dL (ref 32.0–36.0)
MCV: 84.7 fL (ref 80.0–100.0)
MPV: 10.6 fL (ref 7.5–12.5)
Monocytes Relative: 8.6 %
Neutro Abs: 3550 cells/uL (ref 1500–7800)
Neutrophils Relative %: 52.2 %
Platelets: 215 10*3/uL (ref 140–400)
RBC: 4.44 10*6/uL (ref 3.80–5.10)
RDW: 12.5 % (ref 11.0–15.0)
Total Lymphocyte: 36.4 %
WBC: 6.8 10*3/uL (ref 3.8–10.8)

## 2022-09-10 LAB — BASIC METABOLIC PANEL WITH GFR
BUN/Creatinine Ratio: 21 (calc) (ref 6–22)
BUN: 23 mg/dL (ref 7–25)
CO2: 28 mmol/L (ref 20–32)
Calcium: 10.4 mg/dL (ref 8.6–10.4)
Chloride: 102 mmol/L (ref 98–110)
Creat: 1.07 mg/dL — ABNORMAL HIGH (ref 0.60–0.95)
Glucose, Bld: 94 mg/dL (ref 65–99)
Potassium: 4.2 mmol/L (ref 3.5–5.3)
Sodium: 141 mmol/L (ref 135–146)
eGFR: 51 mL/min/{1.73_m2} — ABNORMAL LOW (ref >=60)

## 2022-09-10 LAB — C-REACTIVE PROTEIN: CRP: 1.6 mg/L (ref <8.0)

## 2022-09-10 LAB — SEDIMENTATION RATE: Sed Rate: 29 mm/h (ref 0–30)

## 2022-09-30 DIAGNOSIS — R413 Other amnesia: Secondary | ICD-10-CM | POA: Diagnosis not present

## 2022-09-30 DIAGNOSIS — F419 Anxiety disorder, unspecified: Secondary | ICD-10-CM | POA: Diagnosis not present

## 2022-09-30 DIAGNOSIS — M40209 Unspecified kyphosis, site unspecified: Secondary | ICD-10-CM | POA: Diagnosis not present

## 2022-09-30 DIAGNOSIS — I10 Essential (primary) hypertension: Secondary | ICD-10-CM | POA: Diagnosis not present

## 2022-11-01 ENCOUNTER — Ambulatory Visit (INDEPENDENT_AMBULATORY_CARE_PROVIDER_SITE_OTHER): Payer: Medicare HMO | Admitting: Orthopaedic Surgery

## 2022-11-01 ENCOUNTER — Ambulatory Visit (INDEPENDENT_AMBULATORY_CARE_PROVIDER_SITE_OTHER): Payer: Medicare HMO

## 2022-11-01 DIAGNOSIS — M1711 Unilateral primary osteoarthritis, right knee: Secondary | ICD-10-CM

## 2022-11-01 DIAGNOSIS — M25561 Pain in right knee: Secondary | ICD-10-CM | POA: Diagnosis not present

## 2022-11-01 MED ORDER — TRIAMCINOLONE ACETONIDE 40 MG/ML IJ SUSP
80.0000 mg | INTRAMUSCULAR | Status: AC | PRN
  Administered 2022-11-01: 80 mg via INTRA_ARTICULAR

## 2022-11-01 MED ORDER — LIDOCAINE HCL 1 % IJ SOLN
4.0000 mL | INTRAMUSCULAR | Status: AC | PRN
  Administered 2022-11-01: 4 mL

## 2022-11-01 NOTE — Progress Notes (Signed)
Chief Complaint: Right knee pain right knee pain     History of Present Illness:    Karina [name] is a 88 y.o. female presents today with ongoing right knee pain for the last 3 weeks.  She does not have any specific injury.  She did feel like she hyperextended the knee and since that time she has had swelling and pain in the back of the knee.  She did have a debridement 40 years ago for which she was told that she may eventually develop arthritis.  She is having difficulty with most basic activities of daily living.  She is here today for further assessment.    Surgical History:   None  PMH/PSH/Family History/Social History/Meds/Allergies:    Past Medical History:  Diagnosis Date  . Actinomyces infection 01/29/2022  . ANEMIA, CHRONIC 08/13/2006  . Arthritis    left knee  . Atrophic vaginitis 10/03/2013  . CONSTIPATION 01/28/2011  . Depression with anxiety 2012/04/02   Husband, [name] died in 08-17-11 after 46 years of marriage  Lost a 34 year old daughter to cancer   . DIVERTICULOSIS, COLON 01/28/2011  . Dizziness 03/06/2022  . Double vision 01/29/2022  . Endometriosis   . GERD (gastroesophageal reflux disease)   . HTN (hypertension) 03/06/2011  . Hypercholesteremia Apr 02, 2012  . Infection due to Enterobacter cloacae 01/29/2022  . Lesion of external ear 03/06/2011  . Murmur 03/06/2022  . OAB (overactive bladder) 12/17/2015  . Osteoporosis   . Post menopausal problems 03/06/2011  . Postural dizziness 01/29/2022  . Sinus tachycardia    mild-- retesting  . SVT (supraventricular tachycardia) 2007   3-4 beats.. Holter  . Vaccine counseling 09/09/2022   Past Surgical History:  Procedure Laterality Date  . ABDOMINAL HYSTERECTOMY     w/ unilateral oophorectomy-bleeding, ovarian cysts, left ovary left in place?  Marland Kitchen BREAST SURGERY     Implants--Silicone  . INCISION AND DRAINAGE ABSCESS Left 01/09/2022   Procedure: INCISION AND DRAINAGE  NECK  ABSCESS;  Surgeon: Jason Coop, DO;  Location: [facility] OR;  Service: ENT;  Laterality: Left;  . right knee arthroscopy, cleaned     Social History   Socioeconomic History  . Marital status: Widowed    Spouse name: Not on file  . Number of children: Not on file  . Years of education: Not on file  . Highest education level: Not on file  Occupational History  . Not on file  Tobacco Use  . Smoking status: Never  . Smokeless tobacco: Never  Substance and Sexual Activity  . Alcohol use: No    Alcohol/week: 0.0 standard drinks of alcohol  . Drug use: No  . Sexual activity: Never    Partners: Male    Birth control/protection: Surgical    Comment: TAH--Poss. left ovary remains  Other Topics Concern  . Not on file  Social History Narrative  . Not on file   Social Determinants of Health   Financial Resource Strain: Not on file  Food Insecurity: Not on file  Transportation Needs: Not on file  Physical Activity: Not on file  Stress: Not on file  Social Connections: Not on file   Family History  Problem Relation Age of Onset  . Stroke Mother   . Hypertension Mother   . Other Mother  CHF  . Transient ischemic attack Mother   . Cancer Mother 13       cervical  . Cancer Father        stomach, smoker  . Cancer Sister        brain  . Lymphoma Brother   . Cancer Brother   . Stroke Daughter        ? stroke  . Mitral valve prolapse Daughter   . Irritable bowel syndrome Daughter   . Migraines Daughter   . Hypertension Son   . Other Maternal Grandmother        CHF  . Alcohol abuse Brother   . Heart attack Brother        smoker  . Cancer Paternal Aunt        cancer  . Cancer Paternal Uncle        X 2 CA  . Heart attack Paternal Uncle    No Known Allergies Current Outpatient Medications  Medication Sig Dispense Refill  . acetaminophen (TYLENOL) 325 MG tablet Take 2 tablets (650 mg total) by mouth every 6 (six) hours as needed for mild pain (or Fever >/=  101). 20 tablet 0  . ALPRAZolam (XANAX) 0.5 MG tablet Take 1 tablet (0.5 mg total) by mouth as needed for sleep or anxiety. 1/2 to 2 tabs po bid prn for anxiety, insomnia 90 tablet 0  . amoxicillin (AMOXIL) 500 MG capsule Take 1 capsule (500 mg total) by mouth 3 (three) times daily. To start after finishing IV invanz 90 capsule 11  . aspirin 81 MG tablet Take 81 mg by mouth daily.    . Cholecalciferol (VITAMIN D-3 PO) Take 5,000 Units by mouth in the morning and at bedtime.    . Coenzyme Q10 (CO Q-10) 50 MG CAPS Take 1 capsule by mouth daily.    Marland Kitchen diltiazem (CARDIZEM CD) 240 MG 24 hr capsule TAKE 1 CAPSULE (240 MG TOTAL) BY MOUTH DAILY. 90 capsule 2  . levothyroxine (SYNTHROID) 75 MCG tablet Take 75 mcg by mouth daily before breakfast.    . metoprolol succinate (TOPROL-XL) 50 MG 24 hr tablet Take 50 mg by mouth 2 (two) times daily.    . Multiple Vitamins-Minerals (ALIVE WOMENS 50+) TABS Take 1 tablet by mouth daily.    . pantoprazole (PROTONIX) 40 MG tablet Take 40 mg by mouth daily.    . rosuvastatin (CRESTOR) 10 MG tablet Take 1 tablet (10 mg total) by mouth daily. 90 tablet 3  . saccharomyces boulardii (FLORASTOR) 250 MG capsule Take 1 capsule (250 mg total) by mouth 3 (three) times daily. 180 capsule 0   No current facility-administered medications for this visit.   No results found.  Review of Systems:   A ROS was performed including pertinent positives and negatives as documented in the HPI.  Physical Exam :   Constitutional: NAD and appears stated age Neurological: Alert and oriented Psych: Appropriate affect and cooperative There were no vitals taken for this visit.   Comprehensive Musculoskeletal Exam:    Tenderness to palpation about posterior joint line medially and laterally.  Range of motion is from 0 to 130 degrees without significant crepitus.  Negative Lachman.  Imaging:   Xray (4 views right knee): Mild medial joint space narrowing consistent with early  osteoarthritis   I personally reviewed and interpreted the radiographs.   Assessment:   88 y.o. female with mild and early medial compartment osteoarthritis of the right knee.  I did describe that overall I  do believe her swelling and symptoms are consistent with early degenerative arthritis.  To that effect I recommended ultrasound-guided injection of the right knee.  She would like to proceed with this  Plan :    -Right knee ultrasound-guided injection performed and verbal consent obtained     Procedure Note  Patient: Karina [name]             Date of Birth: [birth year]           MRN: [id number]             Visit Date: 11/01/2022  Procedures: Visit Diagnoses:  1. Acute pain of right knee     Large Joint Inj: R knee on 11/01/2022 1:58 PM Indications: pain Details: 22 G 1.5 in needle, ultrasound-guided anterior approach  Arthrogram: No  Medications: 4 mL lidocaine 1 %; 80 mg triamcinolone acetonide 40 MG/ML Outcome: tolerated well, no immediate complications Procedure, treatment alternatives, risks and benefits explained, specific risks discussed. Consent was given by the patient. Immediately prior to procedure a time out was called to verify the correct patient, procedure, equipment, support staff and site/side marked as required. Patient was prepped and draped in the usual sterile fashion.         I personally saw and evaluated the patient, and participated in the management and treatment plan.  Vanetta Mulders, MD Attending Physician, Orthopedic Surgery  This document was dictated using Dragon voice recognition software. A reasonable attempt at proof reading has been made to minimize errors.

## 2023-01-24 ENCOUNTER — Other Ambulatory Visit: Payer: Self-pay | Admitting: Infectious Disease

## 2023-01-24 NOTE — Telephone Encounter (Signed)
Please advise regarding refill, looks like she's done a year of the amoxicillin. She has an appointment with you 3/25. Want me to send in one more refill to cover her until that appointment? Thanks!

## 2023-02-03 ENCOUNTER — Other Ambulatory Visit: Payer: Self-pay

## 2023-02-03 ENCOUNTER — Ambulatory Visit: Payer: Medicare HMO | Admitting: Infectious Disease

## 2023-02-03 ENCOUNTER — Encounter: Payer: Self-pay | Admitting: Infectious Disease

## 2023-02-03 VITALS — BP 132/85 | HR 76 | Temp 97.8°F | Ht 63.0 in | Wt 127.0 lb

## 2023-02-03 DIAGNOSIS — M272 Inflammatory conditions of jaws: Secondary | ICD-10-CM | POA: Diagnosis not present

## 2023-02-03 DIAGNOSIS — Z23 Encounter for immunization: Secondary | ICD-10-CM

## 2023-02-03 DIAGNOSIS — Z7185 Encounter for immunization safety counseling: Secondary | ICD-10-CM | POA: Diagnosis not present

## 2023-02-03 DIAGNOSIS — A429 Actinomycosis, unspecified: Secondary | ICD-10-CM | POA: Diagnosis not present

## 2023-02-03 NOTE — Progress Notes (Signed)
Subjective:   Chief complaint: Follow-up for mandibular osteomyelitis on amoxicillin  Patient ID: Karina [name], female    DOB: 10-Feb-1933, 89 y.o.   MRN: [id number]  HPI  88 y.o. female with neck abscess and mandibular osteomyelitis status post I&D of abscess by ear nose and throat surgery.  I saw her in the inpatient service and she initially had Enterobacter cloacae growing from cultures but later and actinomyces species grew.  She was continued on with ertapenem and she had repeat CT scan that continue to show improvement in her pathology.  She has follow-up with ear nose and throat on February 07, 2022.  Since discharge from the hospital she has been having some intermittent dizziness particular when she stands up quickly.  She also has at times seen double.  We did check orthostatic vitals last time and she was not orthostatic.  She continues to have episodic dizziness can happen at any moment in time but then goes away.  When she finished ertapenem the dizziness actually resolved and she has been tolerating amoxicillin without any difficulty.    She has now been on amoxicillin and ertapenem for more than a year of treatment.  She has not had any turn of pain and is doing well without complaints, the numbness in her face.           Past Medical History:  Diagnosis Date   Actinomyces infection 01/29/2022   ANEMIA, CHRONIC 08/13/2006   Arthritis    left knee   Atrophic vaginitis 10/03/2013   CONSTIPATION 01/28/2011   Depression with anxiety Mar 12, 2012   Husband, [name] died in Aug 13, 2011 after 44 years of marriage  Lost a 19 year old daughter to cancer    DIVERTICULOSIS, COLON 01/28/2011   Dizziness 03/06/2022   Double vision 01/29/2022   Endometriosis    GERD (gastroesophageal reflux disease)    HTN (hypertension) 03/06/2011   Hypercholesteremia Mar 12, 2012   Infection due to Enterobacter cloacae 01/29/2022   Lesion of external ear 03/06/2011   Murmur  03/06/2022   OAB (overactive bladder) 12/17/2015   Osteoporosis    Post menopausal problems 03/06/2011   Postural dizziness 01/29/2022   Sinus tachycardia    mild-- retesting   SVT (supraventricular tachycardia) 2007   3-4 beats.. Holter   Vaccine counseling 09/09/2022    Past Surgical History:  Procedure Laterality Date   ABDOMINAL HYSTERECTOMY     w/ unilateral oophorectomy-bleeding, ovarian cysts, left ovary left in place?   BREAST SURGERY     Implants--Silicone   INCISION AND DRAINAGE ABSCESS Left 01/09/2022   Procedure: INCISION AND DRAINAGE NECK  ABSCESS;  Surgeon: Jason Coop, DO;  Location: [facility] OR;  Service: ENT;  Laterality: Left;   right knee arthroscopy, cleaned      Family History  Problem Relation Age of Onset   Stroke Mother    Hypertension Mother    Other Mother        CHF   Transient ischemic attack Mother    Cancer Mother 56       cervical   Cancer Father        stomach, smoker   Cancer Sister        brain   Lymphoma Brother    Cancer Brother    Stroke Daughter        ? stroke   Mitral valve prolapse Daughter    Irritable bowel syndrome Daughter    Migraines Daughter    Hypertension Son  Other Maternal Grandmother        CHF   Alcohol abuse Brother    Heart attack Brother        smoker   Cancer Paternal Aunt        cancer   Cancer Paternal Uncle        X 2 CA   Heart attack Paternal Uncle       Social History   Socioeconomic History   Marital status: Widowed    Spouse name: Not on file   Number of children: Not on file   Years of education: Not on file   Highest education level: Not on file  Occupational History   Not on file  Tobacco Use   Smoking status: Never   Smokeless tobacco: Never  Substance and Sexual Activity   Alcohol use: No    Alcohol/week: 0.0 standard drinks of alcohol   Drug use: No   Sexual activity: Never    Partners: Male    Birth control/protection: Surgical    Comment: TAH--Poss. left ovary remains   Other Topics Concern   Not on file  Social History Narrative   Not on file   Social Determinants of Health   Financial Resource Strain: Not on file  Food Insecurity: Not on file  Transportation Needs: Not on file  Physical Activity: Not on file  Stress: Not on file  Social Connections: Not on file    No Known Allergies   Current Outpatient Medications:    acetaminophen (TYLENOL) 325 MG tablet, Take 2 tablets (650 mg total) by mouth every 6 (six) hours as needed for mild pain (or Fever >/= 101)., Disp: 20 tablet, Rfl: 0   ALPRAZolam (XANAX) 0.5 MG tablet, Take 1 tablet (0.5 mg total) by mouth as needed for sleep or anxiety. 1/2 to 2 tabs po bid prn for anxiety, insomnia, Disp: 90 tablet, Rfl: 0   amoxicillin (AMOXIL) 500 MG capsule, TAKE 1 CAPSULE (500 MG TOTAL) BY MOUTH 3 (THREE) TIMES DAILY. TO START AFTER FINISHING IV INVANZ, Disp: 90 capsule, Rfl: 0   aspirin 81 MG tablet, Take 81 mg by mouth daily., Disp: , Rfl:    Cholecalciferol (VITAMIN D-3 PO), Take 5,000 Units by mouth in the morning and at bedtime., Disp: , Rfl:    Coenzyme Q10 (CO Q-10) 50 MG CAPS, Take 1 capsule by mouth daily., Disp: , Rfl:    diltiazem (CARDIZEM CD) 240 MG 24 hr capsule, TAKE 1 CAPSULE (240 MG TOTAL) BY MOUTH DAILY., Disp: 90 capsule, Rfl: 2   levothyroxine (SYNTHROID) 75 MCG tablet, Take 75 mcg by mouth daily before breakfast., Disp: , Rfl:    metoprolol succinate (TOPROL-XL) 50 MG 24 hr tablet, Take 50 mg by mouth 2 (two) times daily., Disp: , Rfl:    Multiple Vitamins-Minerals (ALIVE WOMENS 50+) TABS, Take 1 tablet by mouth daily., Disp: , Rfl:    pantoprazole (PROTONIX) 40 MG tablet, Take 40 mg by mouth daily., Disp: , Rfl:    rosuvastatin (CRESTOR) 10 MG tablet, Take 1 tablet (10 mg total) by mouth daily., Disp: 90 tablet, Rfl: 3   saccharomyces boulardii (FLORASTOR) 250 MG capsule, Take 1 capsule (250 mg total) by mouth 3 (three) times daily., Disp: 180 capsule, Rfl: 0   Review of Systems   Constitutional:  Negative for activity change, appetite change, chills, diaphoresis, fatigue, fever and unexpected weight change.  HENT:  Negative for congestion, rhinorrhea, sinus pressure, sneezing, sore throat and trouble swallowing.   Eyes:  Negative for photophobia and visual disturbance.  Respiratory:  Negative for cough, chest tightness, shortness of breath, wheezing and stridor.   Cardiovascular:  Negative for chest pain, palpitations and leg swelling.  Gastrointestinal:  Negative for abdominal distention, abdominal pain, anal bleeding, blood in stool, constipation, diarrhea, nausea and vomiting.  Genitourinary:  Negative for difficulty urinating, dysuria, flank pain and hematuria.  Musculoskeletal:  Negative for arthralgias, back pain, gait problem, joint swelling and myalgias.  Skin:  Negative for color change, pallor, rash and wound.  Neurological:  Negative for dizziness, tremors, weakness and light-headedness.  Hematological:  Negative for adenopathy. Does not bruise/bleed easily.  Psychiatric/Behavioral:  Negative for agitation, behavioral problems, confusion, decreased concentration, dysphoric mood and sleep disturbance.        Objective:   Physical Exam Constitutional:      General: She is not in acute distress.    Appearance: Normal appearance. She is well-developed. She is not ill-appearing or diaphoretic.  HENT:     Head: Normocephalic and atraumatic.     Right Ear: Hearing and external ear normal.     Left Ear: Hearing and external ear normal.     Nose: No nasal deformity or rhinorrhea.  Eyes:     General: No scleral icterus.    Conjunctiva/sclera: Conjunctivae normal.     Right eye: Right conjunctiva is not injected.     Left eye: Left conjunctiva is not injected.     Pupils: Pupils are equal, round, and reactive to light.  Neck:     Vascular: No JVD.  Cardiovascular:     Rate and Rhythm: Normal rate and regular rhythm.     Heart sounds: S1 normal and S2  normal. No murmur heard. Pulmonary:     Effort: Pulmonary effort is normal. No respiratory distress.     Breath sounds: No wheezing.  Abdominal:     General: There is no distension.     Palpations: Abdomen is soft.  Musculoskeletal:        General: Normal range of motion.     Right shoulder: Normal.     Left shoulder: Normal.     Cervical back: Normal range of motion and neck supple.     Right hip: Normal.     Left hip: Normal.     Right knee: Normal.     Left knee: Normal.  Lymphadenopathy:     Head:     Right side of head: No submandibular, preauricular or posterior auricular adenopathy.     Left side of head: No submandibular, preauricular or posterior auricular adenopathy.     Cervical: No cervical adenopathy.     Right cervical: No superficial or deep cervical adenopathy.    Left cervical: No superficial or deep cervical adenopathy.  Skin:    General: Skin is warm and dry.     Coloration: Skin is not pale.     Findings: No abrasion, bruising, ecchymosis, erythema, lesion or rash.     Nails: There is no clubbing.  Neurological:     General: No focal deficit present.     Mental Status: She is alert and oriented to person, place, and time.     Sensory: No sensory deficit.     Coordination: Coordination normal.     Gait: Gait normal.  Psychiatric:        Attention and Perception: She is attentive.        Mood and Affect: Mood normal.        Speech: Speech normal.  Behavior: Behavior normal. Behavior is cooperative.        Thought Content: Thought content normal.        Judgment: Judgment normal.    Angle of jaw 05/07/2022:     Exam is stable    Assessment & Plan:  Polymicrobial mandibular osteomyelitis with Enterobacter and actinomyces having been isolated:  She is status post 6 weeks of ertapenem and now amoxicillin having completed more than a year of beta-lactam therapy.  Recheck labs today including sed rate CRP BMP with GFR and CBC with differential.   I have asked her though to stop amoxicillin.  Will plan on seeing her in 2 months time and monitor for recurrence.  Of asked her to hold onto the amoxicillin she has in case she develops evidence of recurrence including return of pain or swelling or systemic symptoms     Vaccine counseling recommended she get updated Prevnar 20 today which she did

## 2023-02-03 NOTE — Addendum Note (Signed)
Addended by: Adelfa Koh on: 02/03/2023 03:30 PM   Modules accepted: Orders

## 2023-02-04 LAB — CBC WITH DIFFERENTIAL/PLATELET
Absolute Monocytes: 494 cells/uL (ref 200–950)
Basophils Absolute: 99 cells/uL (ref 0–200)
Basophils Relative: 1.3 %
Eosinophils Absolute: 106 cells/uL (ref 15–500)
Eosinophils Relative: 1.4 %
HCT: 38.8 % (ref 35.0–45.0)
Hemoglobin: 12.5 g/dL (ref 11.7–15.5)
Lymphs Abs: 3222 cells/uL (ref 850–3900)
MCH: 27.6 pg (ref 27.0–33.0)
MCHC: 32.2 g/dL (ref 32.0–36.0)
MCV: 85.7 fL (ref 80.0–100.0)
MPV: 11.1 fL (ref 7.5–12.5)
Monocytes Relative: 6.5 %
Neutro Abs: 3678 cells/uL (ref 1500–7800)
Neutrophils Relative %: 48.4 %
Platelets: 253 10*3/uL (ref 140–400)
RBC: 4.53 10*6/uL (ref 3.80–5.10)
RDW: 12.1 % (ref 11.0–15.0)
Total Lymphocyte: 42.4 %
WBC: 7.6 10*3/uL (ref 3.8–10.8)

## 2023-02-04 LAB — BASIC METABOLIC PANEL WITH GFR
BUN/Creatinine Ratio: 19 (calc) (ref 6–22)
BUN: 21 mg/dL (ref 7–25)
CO2: 27 mmol/L (ref 20–32)
Calcium: 9.9 mg/dL (ref 8.6–10.4)
Chloride: 105 mmol/L (ref 98–110)
Creat: 1.12 mg/dL — ABNORMAL HIGH (ref 0.60–0.95)
Glucose, Bld: 159 mg/dL — ABNORMAL HIGH (ref 65–99)
Potassium: 3.8 mmol/L (ref 3.5–5.3)
Sodium: 142 mmol/L (ref 135–146)
eGFR: 48 mL/min/{1.73_m2} — ABNORMAL LOW (ref >=60)

## 2023-02-04 LAB — SEDIMENTATION RATE: Sed Rate: 34 mm/h — ABNORMAL HIGH (ref 0–30)

## 2023-02-04 LAB — C-REACTIVE PROTEIN: CRP: 1.8 mg/L (ref <8.0)

## 2023-02-22 ENCOUNTER — Other Ambulatory Visit: Payer: Self-pay | Admitting: Infectious Disease

## 2023-03-24 DIAGNOSIS — K219 Gastro-esophageal reflux disease without esophagitis: Secondary | ICD-10-CM | POA: Diagnosis not present

## 2023-03-24 DIAGNOSIS — E785 Hyperlipidemia, unspecified: Secondary | ICD-10-CM | POA: Diagnosis not present

## 2023-03-24 DIAGNOSIS — M810 Age-related osteoporosis without current pathological fracture: Secondary | ICD-10-CM | POA: Diagnosis not present

## 2023-03-24 DIAGNOSIS — I10 Essential (primary) hypertension: Secondary | ICD-10-CM | POA: Diagnosis not present

## 2023-03-24 DIAGNOSIS — E039 Hypothyroidism, unspecified: Secondary | ICD-10-CM | POA: Diagnosis not present

## 2023-03-31 DIAGNOSIS — Z0000 Encounter for general adult medical examination without abnormal findings: Secondary | ICD-10-CM | POA: Diagnosis not present

## 2023-03-31 DIAGNOSIS — E785 Hyperlipidemia, unspecified: Secondary | ICD-10-CM | POA: Diagnosis not present

## 2023-03-31 DIAGNOSIS — R413 Other amnesia: Secondary | ICD-10-CM | POA: Diagnosis not present

## 2023-03-31 DIAGNOSIS — Z1331 Encounter for screening for depression: Secondary | ICD-10-CM | POA: Diagnosis not present

## 2023-03-31 DIAGNOSIS — Z1339 Encounter for screening examination for other mental health and behavioral disorders: Secondary | ICD-10-CM | POA: Diagnosis not present

## 2023-03-31 DIAGNOSIS — R82998 Other abnormal findings in urine: Secondary | ICD-10-CM | POA: Diagnosis not present

## 2023-03-31 DIAGNOSIS — I10 Essential (primary) hypertension: Secondary | ICD-10-CM | POA: Diagnosis not present

## 2023-03-31 DIAGNOSIS — E039 Hypothyroidism, unspecified: Secondary | ICD-10-CM | POA: Diagnosis not present

## 2023-03-31 DIAGNOSIS — I471 Supraventricular tachycardia, unspecified: Secondary | ICD-10-CM | POA: Diagnosis not present

## 2023-04-14 NOTE — Progress Notes (Unsigned)
Subjective:   Chief complaint: followup for mandibular osteomyelitis off of antibiotics   Patient ID: Karina [name], female    DOB: 1933-03-21, 89 y.o.   MRN: [id number]  HPI  89 y.o. female with neck abscess and mandibular osteomyelitis status post I&D of abscess by ear nose and throat surgery.  I saw her in the inpatient service and she initially had Enterobacter cloacae growing from cultures but later and actinomyces species grew.  She was continued on with ertapenem and she had repeat CT scan that continue to show improvement in her pathology.  She has follow-up with ear nose and throat on February 07, 2022.  Since discharge from the hospital she has been having some intermittent dizziness particular when she stands up quickly.  She also has at times seen double.  We did check orthostatic vitals last time and she was not orthostatic.  She continues to have episodic dizziness can happen at any moment in time but then goes away.  When she finished ertapenem the dizziness actually resolved and she has been tolerating amoxicillin without any difficulty.    She has now been on amoxicillin and ertapenem for more than a year of treatment.  She has not had any turn of pain and is doing well without complaints, the numbness in her face.  Continues to do well off the buttocks she was curious about Crestor which had been filled under my name.  I do not recall prescribing the medication it does appear that it was started by Rocco Serene in 2022/08/02.           Past Medical History:  Diagnosis Date   Actinomyces infection 01/29/2022   ANEMIA, CHRONIC 08/13/2006   Arthritis    left knee   Atrophic vaginitis 10/03/2013   CONSTIPATION 01/28/2011   Depression with anxiety 03-23-2012   Husband, [name] died in Aug 03, 2011 after 56 years of marriage  Lost a 21 year old daughter to cancer    DIVERTICULOSIS, COLON 01/28/2011   Dizziness 03/06/2022   Double vision 01/29/2022    Endometriosis    GERD (gastroesophageal reflux disease)    HTN (hypertension) 03/06/2011   Hypercholesteremia 03/23/12   Infection due to Enterobacter cloacae 01/29/2022   Lesion of external ear 03/06/2011   Murmur 03/06/2022   OAB (overactive bladder) 12/17/2015   Osteoporosis    Post menopausal problems 03/06/2011   Postural dizziness 01/29/2022   Sinus tachycardia    mild-- retesting   SVT (supraventricular tachycardia) 2007   3-4 beats.. Holter   Vaccine counseling 09/09/2022    Past Surgical History:  Procedure Laterality Date   ABDOMINAL HYSTERECTOMY     w/ unilateral oophorectomy-bleeding, ovarian cysts, left ovary left in place?   BREAST SURGERY     Implants--Silicone   INCISION AND DRAINAGE ABSCESS Left 01/09/2022   Procedure: INCISION AND DRAINAGE NECK  ABSCESS;  Surgeon: Laren Boom, DO;  Location: [facility] OR;  Service: ENT;  Laterality: Left;   right knee arthroscopy, cleaned      Family History  Problem Relation Age of Onset   Stroke Mother    Hypertension Mother    Other Mother        CHF   Transient ischemic attack Mother    Cancer Mother 18       cervical   Cancer Father        stomach, smoker   Cancer Sister        brain   Lymphoma Brother  Cancer Brother    Stroke Daughter        ? stroke   Mitral valve prolapse Daughter    Irritable bowel syndrome Daughter    Migraines Daughter    Hypertension Son    Other Maternal Grandmother        CHF   Alcohol abuse Brother    Heart attack Brother        smoker   Cancer Paternal Aunt        cancer   Cancer Paternal Uncle        X 2 CA   Heart attack Paternal Uncle       Social History   Socioeconomic History   Marital status: Widowed    Spouse name: Not on file   Number of children: Not on file   Years of education: Not on file   Highest education level: Not on file  Occupational History   Not on file  Tobacco Use   Smoking status: Never   Smokeless tobacco: Never  Substance and  Sexual Activity   Alcohol use: No    Alcohol/week: 0.0 standard drinks of alcohol   Drug use: No   Sexual activity: Never    Partners: Male    Birth control/protection: Surgical    Comment: TAH--Poss. left ovary remains  Other Topics Concern   Not on file  Social History Narrative   Not on file   Social Determinants of Health   Financial Resource Strain: Not on file  Food Insecurity: Not on file  Transportation Needs: Not on file  Physical Activity: Not on file  Stress: Not on file  Social Connections: Not on file    No Known Allergies   Current Outpatient Medications:    acetaminophen (TYLENOL) 325 MG tablet, Take 2 tablets (650 mg total) by mouth every 6 (six) hours as needed for mild pain (or Fever >/= 101)., Disp: 20 tablet, Rfl: 0   ALPRAZolam (XANAX) 0.5 MG tablet, Take 1 tablet (0.5 mg total) by mouth as needed for sleep or anxiety. 1/2 to 2 tabs po bid prn for anxiety, insomnia, Disp: 90 tablet, Rfl: 0   aspirin 81 MG tablet, Take 81 mg by mouth daily., Disp: , Rfl:    Cholecalciferol (VITAMIN D-3 PO), Take 5,000 Units by mouth in the morning and at bedtime., Disp: , Rfl:    Coenzyme Q10 (CO Q-10) 50 MG CAPS, Take 1 capsule by mouth daily., Disp: , Rfl:    diltiazem (CARDIZEM CD) 240 MG 24 hr capsule, TAKE 1 CAPSULE (240 MG TOTAL) BY MOUTH DAILY., Disp: 90 capsule, Rfl: 2   levothyroxine (SYNTHROID) 75 MCG tablet, Take 75 mcg by mouth daily before breakfast., Disp: , Rfl:    metoprolol succinate (TOPROL-XL) 50 MG 24 hr tablet, Take 50 mg by mouth 2 (two) times daily., Disp: , Rfl:    Multiple Vitamins-Minerals (ALIVE WOMENS 50+) TABS, Take 1 tablet by mouth daily., Disp: , Rfl:    pantoprazole (PROTONIX) 40 MG tablet, Take 40 mg by mouth daily., Disp: , Rfl:    rosuvastatin (CRESTOR) 10 MG tablet, Take 1 tablet (10 mg total) by mouth daily., Disp: 90 tablet, Rfl: 3   saccharomyces boulardii (FLORASTOR) 250 MG capsule, Take 1 capsule (250 mg total) by mouth 3 (three)  times daily., Disp: 180 capsule, Rfl: 0   Review of Systems  Constitutional:  Negative for activity change, appetite change, chills, diaphoresis, fatigue, fever and unexpected weight change.  HENT:  Negative for congestion, rhinorrhea,  sinus pressure, sneezing, sore throat and trouble swallowing.   Eyes:  Negative for photophobia and visual disturbance.  Respiratory:  Negative for cough, chest tightness, shortness of breath, wheezing and stridor.   Cardiovascular:  Negative for chest pain, palpitations and leg swelling.  Gastrointestinal:  Negative for abdominal distention, abdominal pain, anal bleeding, blood in stool, constipation, diarrhea, nausea and vomiting.  Genitourinary:  Negative for difficulty urinating, dysuria, flank pain and hematuria.  Musculoskeletal:  Negative for arthralgias, back pain, gait problem, joint swelling and myalgias.  Skin:  Negative for color change, pallor, rash and wound.  Neurological:  Negative for dizziness, tremors, weakness and light-headedness.  Hematological:  Negative for adenopathy. Does not bruise/bleed easily.  Psychiatric/Behavioral:  Negative for agitation, behavioral problems, confusion, decreased concentration, dysphoric mood and sleep disturbance.        Objective:   Physical Exam Constitutional:      General: She is not in acute distress.    Appearance: Normal appearance. She is well-developed. She is not ill-appearing or diaphoretic.  HENT:     Head: Normocephalic and atraumatic.     Right Ear: Hearing and external ear normal.     Left Ear: Hearing and external ear normal.     Nose: No nasal deformity or rhinorrhea.  Eyes:     General: No scleral icterus.    Conjunctiva/sclera: Conjunctivae normal.     Right eye: Right conjunctiva is not injected.     Left eye: Left conjunctiva is not injected.     Pupils: Pupils are equal, round, and reactive to light.  Neck:     Vascular: No JVD.  Cardiovascular:     Rate and Rhythm: Normal  rate and regular rhythm.     Heart sounds: S1 normal and S2 normal. No murmur heard. Abdominal:     General: Bowel sounds are normal. There is no distension.     Palpations: Abdomen is soft.     Tenderness: There is no abdominal tenderness.  Musculoskeletal:        General: Normal range of motion.     Right shoulder: Normal.     Left shoulder: Normal.     Cervical back: Normal range of motion and neck supple.     Right hip: Normal.     Left hip: Normal.     Right knee: Normal.     Left knee: Normal.  Lymphadenopathy:     Head:     Right side of head: No submandibular, preauricular or posterior auricular adenopathy.     Left side of head: No submandibular, preauricular or posterior auricular adenopathy.     Cervical: No cervical adenopathy.     Right cervical: No superficial or deep cervical adenopathy.    Left cervical: No superficial or deep cervical adenopathy.  Skin:    General: Skin is warm and dry.     Coloration: Skin is not pale.     Findings: No abrasion, bruising, ecchymosis, erythema, lesion or rash.     Nails: There is no clubbing.  Neurological:     Mental Status: She is alert and oriented to person, place, and time.     Sensory: No sensory deficit.     Coordination: Coordination normal.     Gait: Gait normal.  Psychiatric:        Attention and Perception: She is attentive.        Speech: Speech normal.        Behavior: Behavior normal. Behavior is cooperative.  Thought Content: Thought content normal.        Judgment: Judgment normal.    Angle of jaw 05/07/2022:     Exam is stable    Assessment & Plan:  Polymicrobial mandibular osteomyelitis with Enterobacter and actinomyces having been isolated:  Check sed rate CRP BMP with GFR and CBC with differential  Hyperlipidemia: appears she is to be continued on crestor per Cardiology   Vaccine counseling: Repeat COVID-19 vaccine.  Endorsed getting RSV vaccination which she will pursue \

## 2023-04-15 ENCOUNTER — Encounter: Payer: Self-pay | Admitting: Infectious Disease

## 2023-04-15 ENCOUNTER — Other Ambulatory Visit: Payer: Self-pay

## 2023-04-15 ENCOUNTER — Ambulatory Visit: Payer: Medicare HMO | Admitting: Infectious Disease

## 2023-04-15 VITALS — BP 166/79 | HR 66 | Resp 16 | Ht 63.0 in | Wt 126.0 lb

## 2023-04-15 DIAGNOSIS — A429 Actinomycosis, unspecified: Secondary | ICD-10-CM | POA: Diagnosis not present

## 2023-04-15 DIAGNOSIS — E785 Hyperlipidemia, unspecified: Secondary | ICD-10-CM

## 2023-04-15 DIAGNOSIS — Z7185 Encounter for immunization safety counseling: Secondary | ICD-10-CM

## 2023-04-15 DIAGNOSIS — A498 Other bacterial infections of unspecified site: Secondary | ICD-10-CM

## 2023-04-15 DIAGNOSIS — M272 Inflammatory conditions of jaws: Secondary | ICD-10-CM

## 2023-04-16 LAB — CBC WITH DIFFERENTIAL/PLATELET
Absolute Monocytes: 490 cells/uL (ref 200–950)
Basophils Absolute: 86 cells/uL (ref 0–200)
Basophils Relative: 1.2 %
Eosinophils Absolute: 137 cells/uL (ref 15–500)
Eosinophils Relative: 1.9 %
HCT: 36.9 % (ref 35.0–45.0)
Hemoglobin: 11.9 g/dL (ref 11.7–15.5)
Lymphs Abs: 3751 cells/uL (ref 850–3900)
MCH: 27.4 pg (ref 27.0–33.0)
MCHC: 32.2 g/dL (ref 32.0–36.0)
MCV: 84.8 fL (ref 80.0–100.0)
MPV: 11 fL (ref 7.5–12.5)
Monocytes Relative: 6.8 %
Neutro Abs: 2736 cells/uL (ref 1500–7800)
Neutrophils Relative %: 38 %
Platelets: 232 10*3/uL (ref 140–400)
RBC: 4.35 10*6/uL (ref 3.80–5.10)
RDW: 12.4 % (ref 11.0–15.0)
Total Lymphocyte: 52.1 %
WBC: 7.2 10*3/uL (ref 3.8–10.8)

## 2023-04-16 LAB — SEDIMENTATION RATE: Sed Rate: 28 mm/h (ref 0–30)

## 2023-04-16 LAB — BASIC METABOLIC PANEL WITH GFR
BUN/Creatinine Ratio: 17 (calc) (ref 6–22)
BUN: 21 mg/dL (ref 7–25)
CO2: 25 mmol/L (ref 20–32)
Calcium: 10 mg/dL (ref 8.6–10.4)
Chloride: 102 mmol/L (ref 98–110)
Creat: 1.26 mg/dL — ABNORMAL HIGH (ref 0.60–0.95)
Glucose, Bld: 89 mg/dL (ref 65–99)
Potassium: 3.9 mmol/L (ref 3.5–5.3)
Sodium: 139 mmol/L (ref 135–146)
eGFR: 41 mL/min/{1.73_m2} — ABNORMAL LOW (ref >=60)

## 2023-04-16 LAB — C-REACTIVE PROTEIN: CRP: <3 mg/L (ref <8.0)

## 2023-04-25 DIAGNOSIS — Z1231 Encounter for screening mammogram for malignant neoplasm of breast: Secondary | ICD-10-CM | POA: Diagnosis not present

## 2023-05-06 DIAGNOSIS — Z8262 Family history of osteoporosis: Secondary | ICD-10-CM | POA: Diagnosis not present

## 2023-05-06 DIAGNOSIS — M8588 Other specified disorders of bone density and structure, other site: Secondary | ICD-10-CM | POA: Diagnosis not present

## 2023-05-06 DIAGNOSIS — M859 Disorder of bone density and structure, unspecified: Secondary | ICD-10-CM | POA: Diagnosis not present

## 2023-05-06 DIAGNOSIS — R29890 Loss of height: Secondary | ICD-10-CM | POA: Diagnosis not present

## 2023-05-11 IMAGING — DX DG CHEST 1V PORT
1 series · 1 of 1 positions shown · non-contrast
Comparison: May 24, 2016.

CLINICAL DATA: Sepsis.

EXAM:
PORTABLE CHEST 1 VIEW

[chest ap]
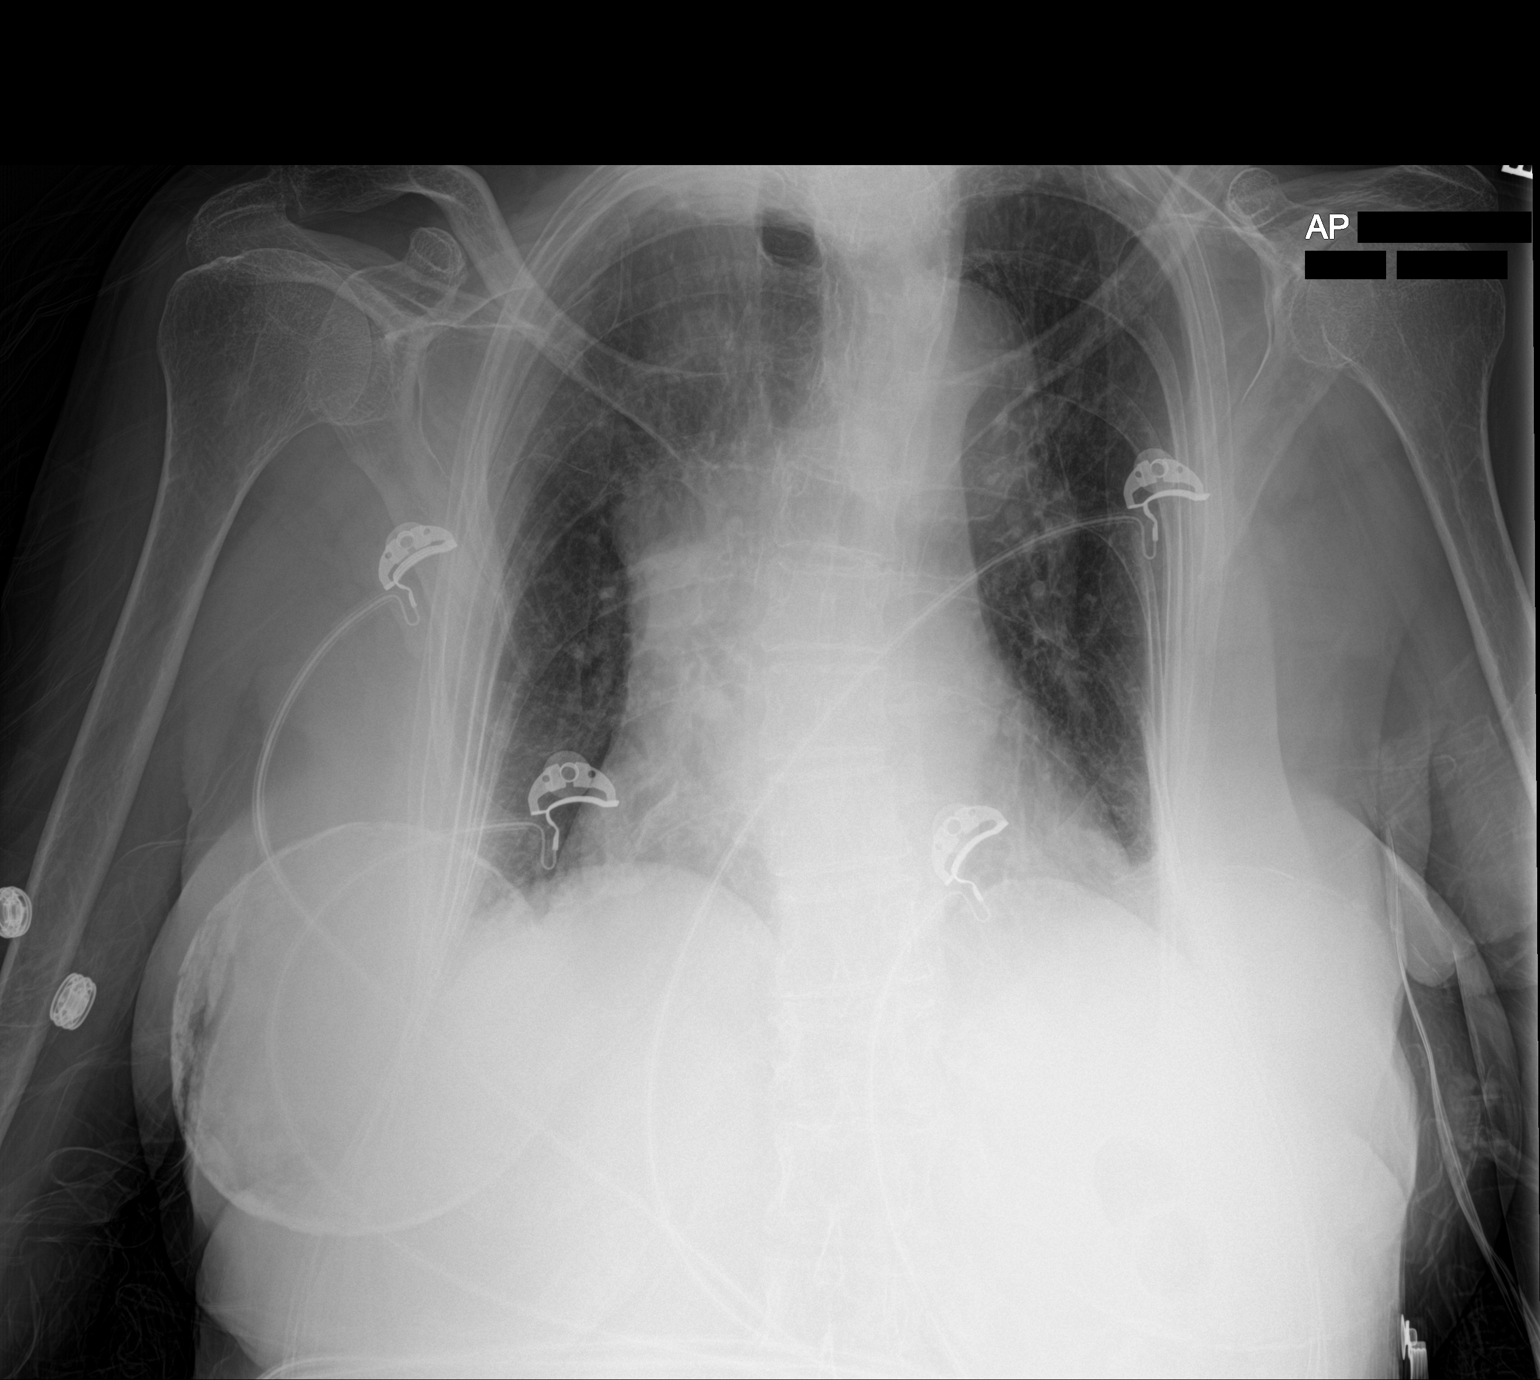

[1 of 1 positions shown; findings below may reference images not displayed]

FINDINGS: Stable cardiomegaly. Both lungs are clear. The visualized skeletal
structures are unremarkable.
IMPRESSION: No active disease.

## 2023-05-11 IMAGING — CT CT NECK W/ CM
3 of 4 series · 10 of 33 positions shown, 12 images · IV contrast (agent unspecified)
Comparison: None.

CLINICAL DATA: Soft tissue swelling, infection suspected, neck xray
done. Generalized weakness. Left jaw infection with redness and
swelling extending into the neck.

EXAM:
CT NECK WITH CONTRAST
TECHNIQUE: Multidetector CT imaging of the neck was performed using the
standard protocol following the bolus administration of intravenous
contrast.

[Series 5: axial · axial · 0.30mm/px · z∈[-275,-151]mm · 2 of 158 slices shown, 3 images]
[im 45/158  soft-tissue]
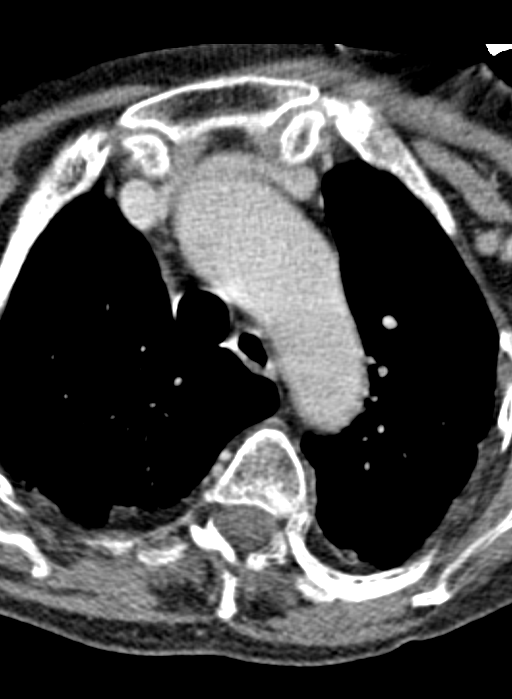
[im 45/158  bone]
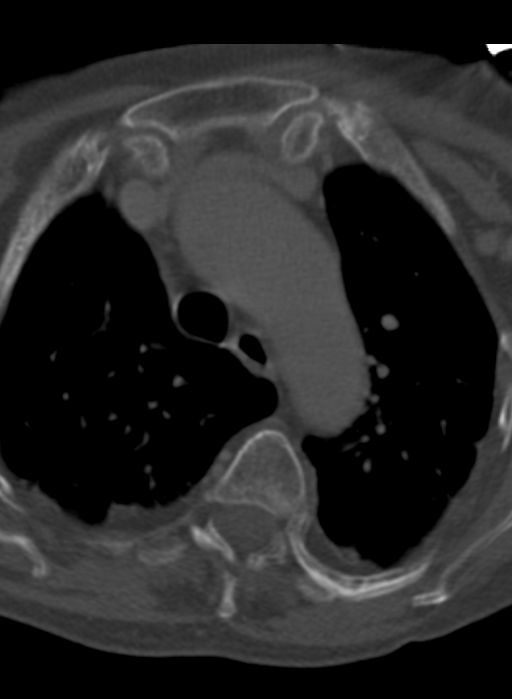
[im 113/158  bone]
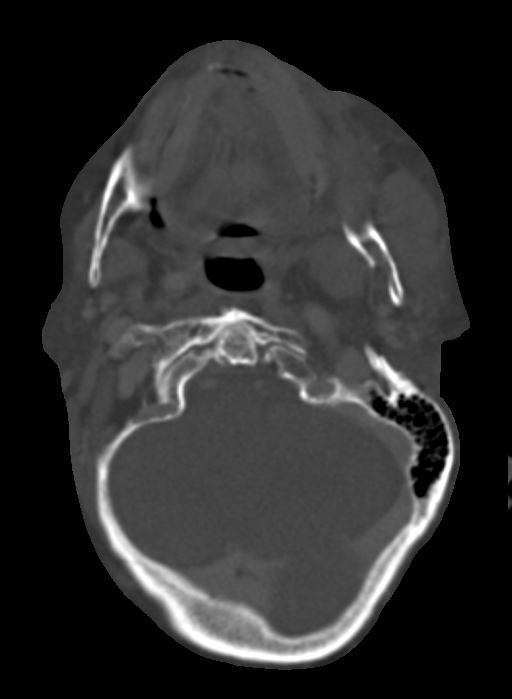

[Series 6: coronal · coronal · 0.30mm/px · 3 of 105 slices shown]
[im 34/105  bone]
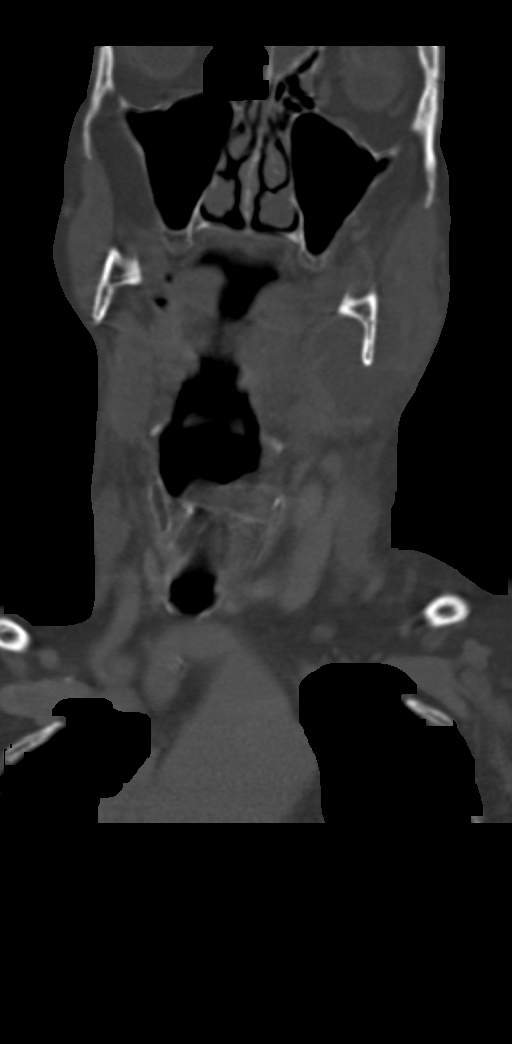
[im 46/105  bone]
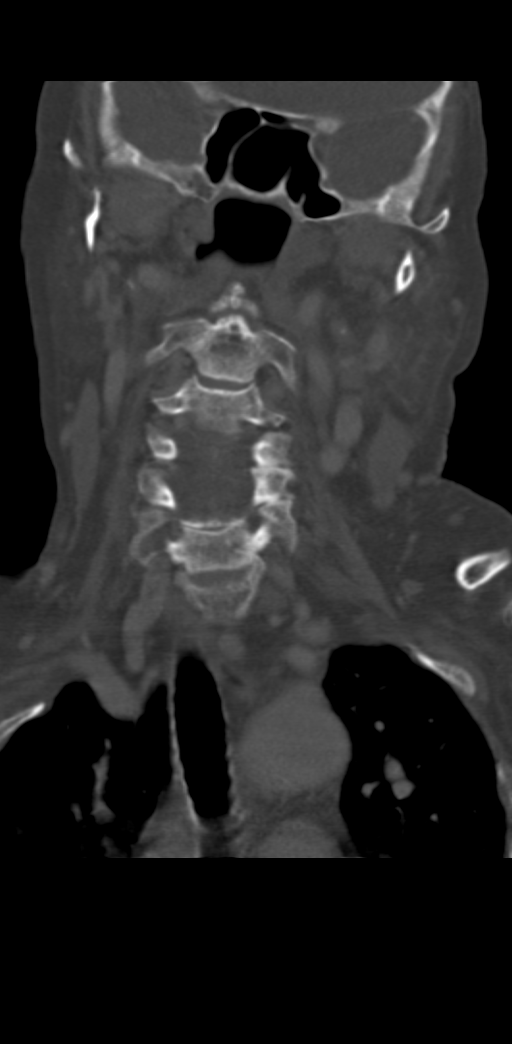
[im 58/105  bone]
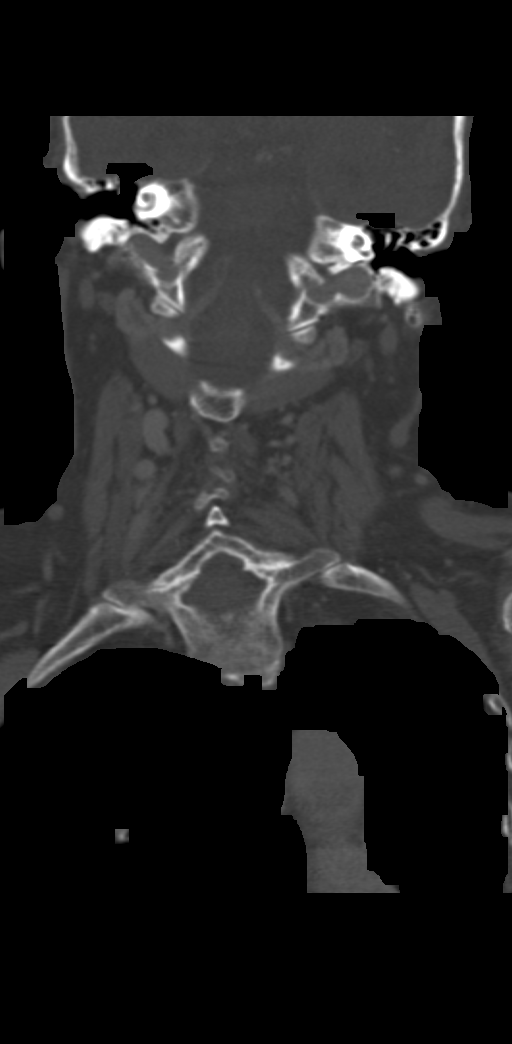

[Series 7: sagittal · sagittal · 0.42mm/px · 5 of 79 slices shown, 6 images]
[im 27/79  bone]
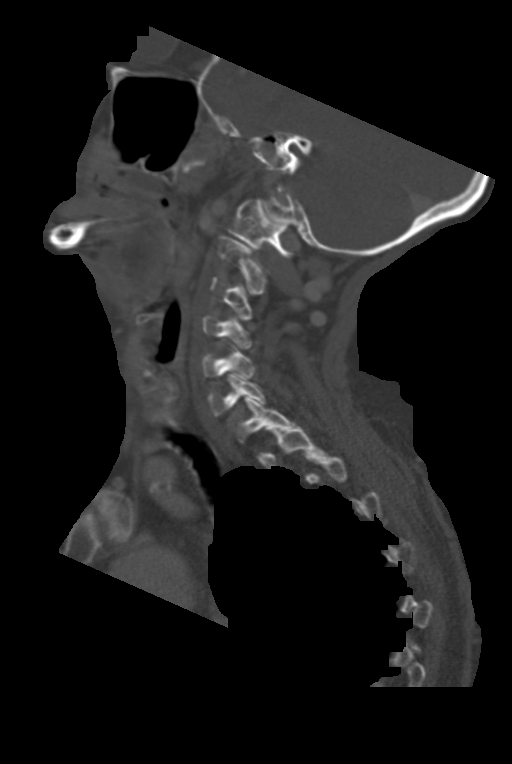
[im 33/79  bone]
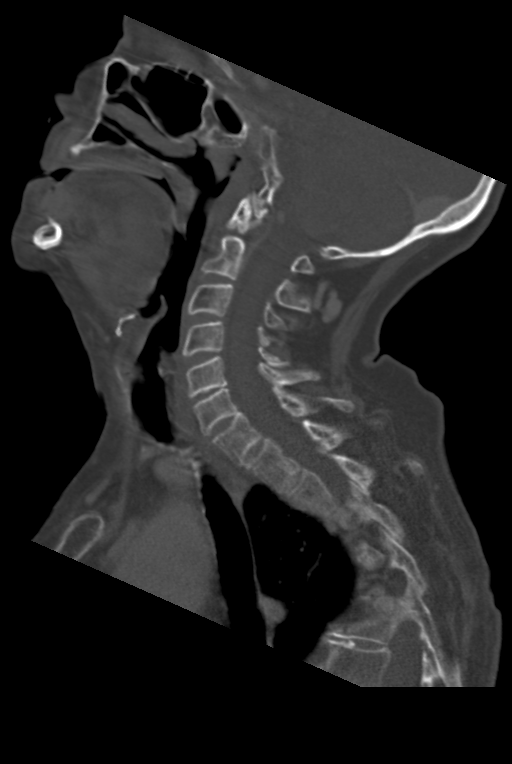
[im 40/79  soft-tissue]
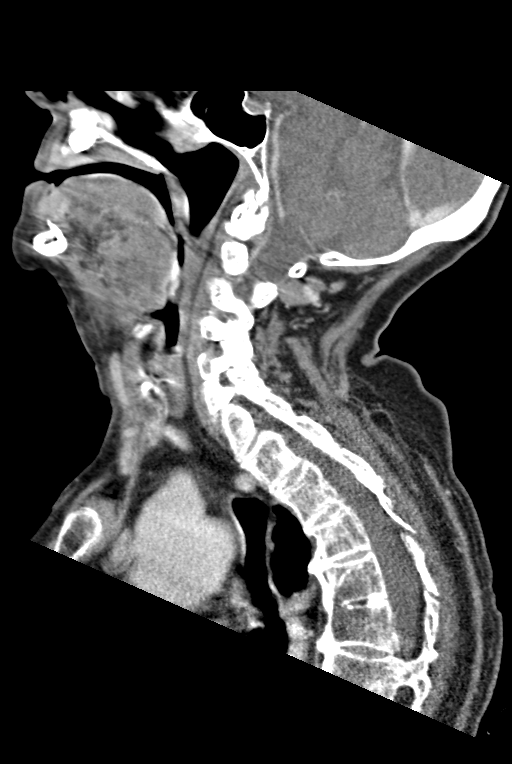
[im 40/79  bone]
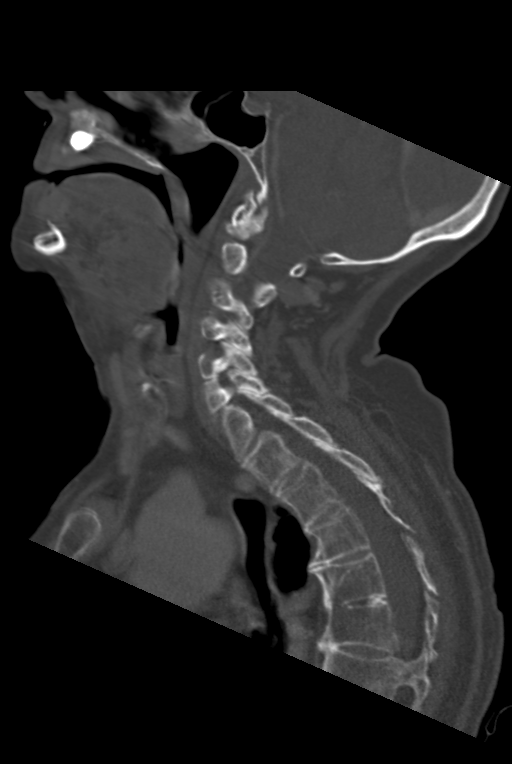
[im 46/79  bone]
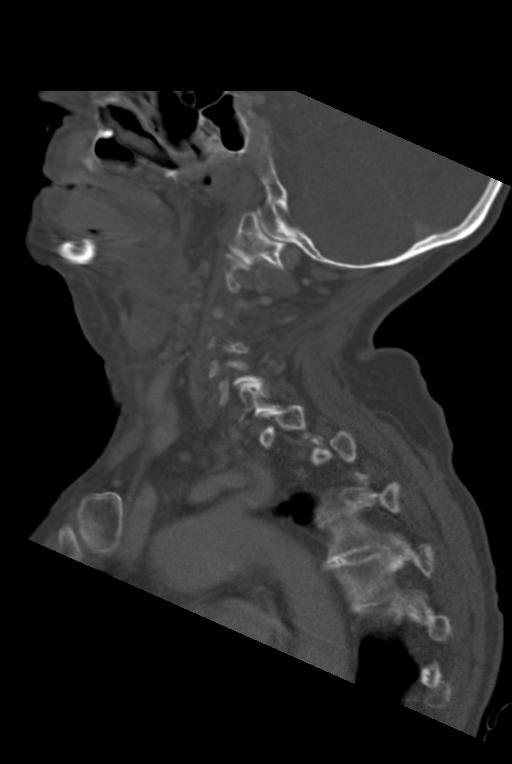
[im 53/79  bone]
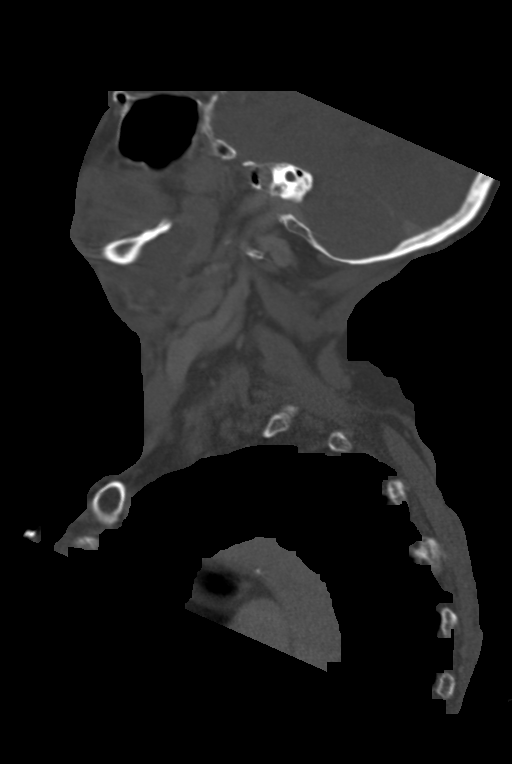

[10 of 33 positions shown; findings below may reference images not displayed]

RADIATION DOSE REDUCTION: This exam was performed according to the
departmental dose-optimization program which includes automated
exposure control, adjustment of the mA and/or kV according to
patient size and/or use of iterative reconstruction technique.

CONTRAST:  60mL OMNIPAQUE IOHEXOL 300 MG/ML  SOLN
FINDINGS: Pharynx and larynx: No mass. Patent airway. No retropharyngeal fluid
collection.

Salivary glands: Inflammatory changes are present in the left
masticator, buccal, and submandibular spaces as well as within the
overlying subcutaneous soft tissues of the face and upper neck. A
low-density collection at the angle of the mandible on the left
measures approximately 3.6 x 3.0 x 4.0 cm, extends inferiorly into
the posterior aspect of the submandibular space, and extends
superiorly in the masticator space with involvement of the
masticator and pterygoid musculature. The mandible appears intact.
The collection and regional inflammatory changes result in inferior
displacement of the left submandibular gland which is mildly
enlarged compared to the right but is more likely to be secondarily
involved rather than reflecting the primary source of this
inflammation. The right submandibular gland and both parotid glands
are unremarkable.

Thyroid: Diffusely small and otherwise grossly unremarkable within
limitations of motion artifact.

Lymph nodes: Borderline enlarged left level IIa lymph node measuring
1 cm in short axis, likely reactive.

Vascular: Major vascular structures of the neck are grossly patent.
Mild carotid atherosclerosis.

Limited intracranial: Unremarkable.

Visualized orbits: Unremarkable.

Mastoids and visualized paranasal sinuses: Clear.

Skeleton: Edentulous aside from an unerupted/possibly supernumerary
tooth in the left maxillary incisor region. Upper thoracic
levoscoliosis and cervical dextroscoliosis.

Upper chest: Biapical pleuroparenchymal lung scarring.

Other: None.
IMPRESSION: 4 cm collection at the angle of the mandible on the left consistent
with abscess with extensive surrounding inflammation.

## 2023-07-10 ENCOUNTER — Other Ambulatory Visit: Payer: Self-pay | Admitting: Cardiology

## 2023-10-05 ENCOUNTER — Other Ambulatory Visit: Payer: Self-pay | Admitting: Cardiology

## 2023-10-08 DIAGNOSIS — I10 Essential (primary) hypertension: Secondary | ICD-10-CM | POA: Diagnosis not present

## 2023-10-08 DIAGNOSIS — R413 Other amnesia: Secondary | ICD-10-CM | POA: Diagnosis not present

## 2023-10-08 DIAGNOSIS — E785 Hyperlipidemia, unspecified: Secondary | ICD-10-CM | POA: Diagnosis not present

## 2023-10-08 DIAGNOSIS — R519 Headache, unspecified: Secondary | ICD-10-CM | POA: Diagnosis not present

## 2023-10-08 DIAGNOSIS — F418 Other specified anxiety disorders: Secondary | ICD-10-CM | POA: Diagnosis not present

## 2023-10-08 DIAGNOSIS — Z23 Encounter for immunization: Secondary | ICD-10-CM | POA: Diagnosis not present

## 2023-10-08 DIAGNOSIS — E039 Hypothyroidism, unspecified: Secondary | ICD-10-CM | POA: Diagnosis not present

## 2023-10-08 DIAGNOSIS — M40209 Unspecified kyphosis, site unspecified: Secondary | ICD-10-CM | POA: Diagnosis not present

## 2023-10-08 DIAGNOSIS — M810 Age-related osteoporosis without current pathological fracture: Secondary | ICD-10-CM | POA: Diagnosis not present

## 2023-10-17 ENCOUNTER — Other Ambulatory Visit: Payer: Self-pay | Admitting: Cardiology

## 2023-10-22 DIAGNOSIS — J0110 Acute frontal sinusitis, unspecified: Secondary | ICD-10-CM | POA: Diagnosis not present

## 2023-10-22 DIAGNOSIS — H9203 Otalgia, bilateral: Secondary | ICD-10-CM | POA: Diagnosis not present

## 2023-10-22 DIAGNOSIS — M25511 Pain in right shoulder: Secondary | ICD-10-CM | POA: Diagnosis not present

## 2023-10-22 DIAGNOSIS — H6121 Impacted cerumen, right ear: Secondary | ICD-10-CM | POA: Diagnosis not present

## 2023-10-22 DIAGNOSIS — H60503 Unspecified acute noninfective otitis externa, bilateral: Secondary | ICD-10-CM | POA: Diagnosis not present

## 2023-10-29 ENCOUNTER — Other Ambulatory Visit: Payer: Self-pay | Admitting: Cardiology

## 2023-11-21 DIAGNOSIS — G8929 Other chronic pain: Secondary | ICD-10-CM | POA: Diagnosis not present

## 2023-11-21 DIAGNOSIS — M25511 Pain in right shoulder: Secondary | ICD-10-CM | POA: Diagnosis not present

## 2023-12-03 ENCOUNTER — Encounter: Payer: Self-pay | Admitting: Cardiology

## 2023-12-03 ENCOUNTER — Ambulatory Visit: Payer: Medicare Other | Attending: Cardiology | Admitting: Cardiology

## 2023-12-03 VITALS — BP 126/78 | HR 79 | Wt 123.6 lb

## 2023-12-03 DIAGNOSIS — E7800 Pure hypercholesterolemia, unspecified: Secondary | ICD-10-CM

## 2023-12-03 DIAGNOSIS — I471 Supraventricular tachycardia, unspecified: Secondary | ICD-10-CM | POA: Diagnosis not present

## 2023-12-03 DIAGNOSIS — I4719 Other supraventricular tachycardia: Secondary | ICD-10-CM

## 2023-12-03 MED ORDER — ROSUVASTATIN CALCIUM 10 MG PO TABS: 10.0000 mg | ORAL_TABLET | Freq: Every day | ORAL | 3 refills | Status: DC

## 2023-12-03 NOTE — Progress Notes (Signed)
Cardiology Office Note:  .   Date:  12/03/2023  ID:  Karina [name], DOB [birth year], MRN [id number] PCP: Geoffry Paradise, MD  [facility] Providers Cardiologist:  Donato Schultz, MD     History of Present Illness: .   Karina [name] is a 89 y.o. female Discussed with the use of AI scribe   History of Present Illness   The patient is an 89 year old female with a history of paroxysmal atrial tachycardia, hyperlipidemia, and mild bilateral carotid stenosis. She has been managing her heart condition with diltiazem 240mg  daily and metoprolol 50mg  twice daily. Her most recent echocardiogram in 2020 showed a normal ejection fraction, and a Holter monitor from the same year revealed brief episodes of paroxysmal atrial tachycardia but no atrial fibrillation. Her LDL cholesterol level is 103.  The patient reports one recent episode of rapid heart rate, which she managed by relaxing. She has had similar episodes in the past. She has been adherent to her medications, including diltiazem and metoprolol.  The patient also has a history of an abscess on her gum in March 2023, which required hospitalization and surgery. She experienced a fever that broke after two and a half days, and she was discharged three days later. A few months after this incident, she had another infection, but it was not as severe.  The patient recently had an issue with her prescription for rosuvastatin (Crestor) for her hyperlipidemia. She was initially told by her pharmacy that the cost would be $414, but she discovered that there is a generic version available. She has been taken off simvastatin due to its interaction with diltiazem. She is considering changing her primary care provider due to transportation difficulties.          Studies Reviewed: Marland Kitchen   EKG Interpretation Date/Time:  Wednesday December 03 2023 14:53:28 EST Ventricular Rate:  79 PR Interval:  174 QRS Duration:  82 QT Interval:  378 QTC  Calculation: 433 R Axis:   23  Text Interpretation: Normal sinus rhythm Normal ECG When compared with ECG of 23-May-2022 23:57, No significant change since last tracing Confirmed by Donato Schultz ([id number]) on 12/03/2023 3:03:41 PM    Results   LABS LDL: 103  RADIOLOGY Carotid duplex: Mild bilateral stenosis (2021)  DIAGNOSTIC Echocardiogram: Normal ejection fraction (2020) Holter monitor: Brief episodes of paroxysmal atrial tachycardia, no atrial fibrillation (2020)     Risk Assessment/Calculations:            Physical Exam:   VS:  BP 126/78   Pulse 79   Wt 123 lb 9.6 oz (56.1 kg)   SpO2 98%   BMI 21.89 kg/m    Wt Readings from Last 3 Encounters:  12/03/23 123 lb 9.6 oz (56.1 kg)  04/15/23 126 lb (57.2 kg)  02/03/23 127 lb (57.6 kg)    GEN: Well nourished, well developed in no acute distress NECK: No JVD; No carotid bruits CARDIAC: RRR, no murmurs, no rubs, no gallops RESPIRATORY:  Clear to auscultation without rales, wheezing or rhonchi  ABDOMEN: Soft, non-tender, non-distended EXTREMITIES:  No edema; No deformity   ASSESSMENT AND PLAN: .    Assessment and Plan    Paroxysmal Atrial Tachycardia 89 year old with paroxysmal atrial tachycardia, presenting for follow-up. Reports one episode of rapid heartbeat since the last visit, resolved with relaxation. On diltiazem 240 mg once daily and metoprolol 50 mg twice daily. Previous Holter monitor in 2020 showed brief episodes of paroxysmal atrial tachycardia without atrial fibrillation. Echocardiogram in 2020  showed normal ejection fraction. Well-managed on current medication regimen. Discussed medication adherence and monitoring for new symptoms. Transitioning routine cardiology follow-up to primary care for convenience. - Continue diltiazem 240 mg once daily - Continue metoprolol 50 mg twice daily - Monitor for new or worsening symptoms - Transition routine cardiology follow-up to primary care physician - Provide information  on local primary care providers - Advise to contact cardiology clinic if new symptoms or concerns arise  Hyperlipidemia Hyperlipidemia with LDL of 103. Previously on simvastatin, switched to rosuvastatin due to interactions with diltiazem. Discussed benefits of rosuvastatin over simvastatin, including fewer drug interactions and better safety profile. Informed about generic availability. - Prescribe rosuvastatin 10 mg - [facility] - Monitor lipid levels at next follow-up  General Health Maintenance Generally in good health and remains active. Recovered well from severe gum abscess in March 2023, no major issues since. Encouraged continued physical activity and social engagement. Discussed the importance of regular dental check-ups to prevent future abscesses. - Encourage continued physical activity and social engagement - Discuss the importance of regular dental check-ups to prevent future abscesses.     PRN follow up. Please have Dr. Jacky Kindle refill future meds. Let us know if further assistance is needed.           Signed, Donato Schultz, MD

## 2023-12-03 NOTE — Patient Instructions (Signed)
Medication Instructions:  The current medical regimen is effective;  continue present plan and medications.  *If you need a refill on your cardiac medications before your next appointment, please call your pharmacy*  Follow-Up: At [facility], you and your health needs are our priority.  As part of our continuing mission to provide you with exceptional heart care, we have created designated Provider Care Teams.  These Care Teams include your primary Cardiologist (physician) and Advanced Practice Providers (APPs -  Physician Assistants and Nurse Practitioners) who all work together to provide you with the care you need, when you need it.  We recommend signing up for the patient portal called "MyChart".  Sign up information is provided on this After Visit Summary.  MyChart is used to connect with patients for Virtual Visits (Telemedicine).  Patients are able to view lab/test results, encounter notes, upcoming appointments, etc.  Non-urgent messages can be sent to your provider as well.   To learn more about what you can do with MyChart, go to [email or web address].    Your next appointment:   Follow up as needed with Dr Skains   

## 2023-12-29 DIAGNOSIS — H903 Sensorineural hearing loss, bilateral: Secondary | ICD-10-CM | POA: Diagnosis not present

## 2024-03-31 DIAGNOSIS — E039 Hypothyroidism, unspecified: Secondary | ICD-10-CM | POA: Diagnosis not present

## 2024-03-31 DIAGNOSIS — M810 Age-related osteoporosis without current pathological fracture: Secondary | ICD-10-CM | POA: Diagnosis not present

## 2024-03-31 DIAGNOSIS — E785 Hyperlipidemia, unspecified: Secondary | ICD-10-CM | POA: Diagnosis not present

## 2024-04-23 DIAGNOSIS — H2513 Age-related nuclear cataract, bilateral: Secondary | ICD-10-CM | POA: Diagnosis not present

## 2024-04-23 DIAGNOSIS — H40013 Open angle with borderline findings, low risk, bilateral: Secondary | ICD-10-CM | POA: Diagnosis not present

## 2024-04-23 DIAGNOSIS — H18413 Arcus senilis, bilateral: Secondary | ICD-10-CM | POA: Diagnosis not present

## 2024-04-23 DIAGNOSIS — H2512 Age-related nuclear cataract, left eye: Secondary | ICD-10-CM | POA: Diagnosis not present

## 2024-04-23 DIAGNOSIS — H35372 Puckering of macula, left eye: Secondary | ICD-10-CM | POA: Diagnosis not present

## 2024-04-26 DIAGNOSIS — Z1331 Encounter for screening for depression: Secondary | ICD-10-CM | POA: Diagnosis not present

## 2024-04-26 DIAGNOSIS — Z0000 Encounter for general adult medical examination without abnormal findings: Secondary | ICD-10-CM | POA: Diagnosis not present

## 2024-04-26 DIAGNOSIS — Z1339 Encounter for screening examination for other mental health and behavioral disorders: Secondary | ICD-10-CM | POA: Diagnosis not present

## 2024-04-26 DIAGNOSIS — I10 Essential (primary) hypertension: Secondary | ICD-10-CM | POA: Diagnosis not present

## 2024-04-27 ENCOUNTER — Telehealth: Payer: Self-pay | Admitting: Cardiology

## 2024-04-27 NOTE — Telephone Encounter (Signed)
 Pt reports some swelling and BP's have been up and down. She denies any shortness of breath, no dizziness. She is unsure of weights. Her BP's Not over 140's, though. She wanted to be seen by Dr Renna Cary- do not see any appt's available very soon. She said she would be ok seeing an APP. Appt made 05/07/24 with an APP.   Instructed her to start wearing compression hose/socks during the day, when she is inside (do not wear them at night) and to elevate legs throughout the day. Keep a daily weight log and a daily BP log, and to bring her logs with her to the appt.   Asked her to let us  know if she were to gain 2-3 pounds in a day or 5 pounds in a week.   Given ER precautions as well. Pt verbalized understanding.

## 2024-04-27 NOTE — Telephone Encounter (Signed)
 Patient identification verified by 2 forms. Sims Duck, RN     Called patient. No answer. LVMTCB

## 2024-04-27 NOTE — Telephone Encounter (Signed)
 Pt c/o swelling/edema: STAT if pt has developed SOB within 24 hours  If swelling, where is the swelling located? Bi-lat LE ankle and feet  How much weight have you gained and in what time span? unsure  Have you gained 2 pounds in a day or 5 pounds in a week? unsure  Do you have a log of your daily weights (if so, list)? No  Are you currently taking a fluid pill? No  Are you currently SOB? On/off   Have you traveled recently in a car or plane for an extended period of time? No

## 2024-04-27 NOTE — Telephone Encounter (Signed)
 Patient is returning phone call.

## 2024-05-04 NOTE — Progress Notes (Unsigned)
 Cardiology Office Note    Date:  05/05/2024  ID:  Karina, [name] 05-28-1933, MRN [id number] PCP:  Shepard Ade, MD  Cardiologist:  Oneil Parchment, MD  Electrophysiologist:  None   Chief Complaint: SOB, LE edema  History of Present Illness: .    Karina [name] is a 89 y.o. female with visit-pertinent history of paroxysmal atrial tachycardia, heart murmur, hyperlipidemia not followed by cardiology, and mild bilateral carotid stenosis seen for edema. Monitor 2020 showed brief PAT and rare PVCs occasionally in bigeminal pattern. Echo 01/2022 showed EF 60-65%, G1DD, normal RV, trivial MR, aortic sclerosis without stenosis. Last seen 11/2023 with recommendation for PRN f/u.  She returns for evaluation of SOB and edema. For the past several weeks she has noticed increase in LE edema. She appears to have some varicose veins. She's also noticed some mild dyspnea with exertion as well as talking for periods of time for about a month. No coughing, orthopnea, chest pain, palpitations. She saw primary care who started triamterene-hydrochlorothiazide . There has been some improvement in her edema but she feels the side effects outweigh the benefit - states it has caused some looser stools but not overt diarrhea. She states Dr. Shepard did a CXR that was clear. She has had a gradual 5lb weight gain. She reports that edema is well controlled today because she has been keeping her legs elevated.  Labwork independently reviewed: [facility] 03/2024 eGFR 52.3 (Cr not reported), K 4.3, trig 149, LDL 94, TSH wnl 04/2023 CBC wnl, K 3.9, Cr 1.26 2020 TSH ok  ROS: .    Please see the history of present illness.  All other systems are reviewed and otherwise negative.  Studies Reviewed: SABRA    EKG:  EKG is ordered today, personally reviewed, demonstrating   EKG Interpretation Date/Time:  Wednesday May 05 2024 15:27:55 EDT Ventricular Rate:  72 PR Interval:  200 QRS Duration:  80 QT Interval:  438 QTC  Calculation: 479 R Axis:   -2  Text Interpretation: Baseline wander but otherwise normal sinus rhythm without acute STTW changes Similar to prior Confirmed by Kreed Kauffman [phone]) on 05/05/2024 3:45:51 PM    CV Studies: Cardiac studies reviewed are outlined and summarized above. Otherwise please see EMR for full report.   Current Reported Medications:.    Current Meds  Medication Sig   acetaminophen  (TYLENOL ) 325 MG tablet Take 2 tablets (650 mg total) by mouth every 6 (six) hours as needed for mild pain (or Fever >/= 101).   ALPRAZolam  (XANAX ) 0.5 MG tablet Take 1 tablet (0.5 mg total) by mouth as needed for sleep or anxiety. 1/2 to 2 tabs po bid prn for anxiety, insomnia   aspirin  81 MG tablet Take 81 mg by mouth daily.   Cholecalciferol (VITAMIN D -3 PO) Take 5,000 Units by mouth in the morning and at bedtime.   Coenzyme Q10 (CO Q-10) 50 MG CAPS Take 1 capsule by mouth daily.   diltiazem  (CARDIZEM  CD) 240 MG 24 hr capsule TAKE 1 CAPSULE (240 MG TOTAL) BY MOUTH DAILY.   levothyroxine  (SYNTHROID ) 50 MCG tablet    metoprolol  succinate (TOPROL -XL) 50 MG 24 hr tablet Take 50 mg by mouth 2 (two) times daily.   Multiple Vitamins-Minerals (ALIVE WOMENS 50+) TABS Take 1 tablet by mouth daily.   pantoprazole  (PROTONIX ) 40 MG tablet Take 40 mg by mouth daily.   rosuvastatin  (CRESTOR ) 10 MG tablet Take 1 tablet (10 mg total) by mouth daily.   triamterene-hydrochlorothiazide  (MAXZIDE-25) 37.5-25 MG  tablet Take 1 tablet by mouth daily.    Physical Exam:    VS:  BP 130/70   Pulse 71   Ht 5' 3 (1.6 m)   Wt 128 lb (58.1 kg)   SpO2 94%   BMI 22.67 kg/m    Wt Readings from Last 3 Encounters:  05/05/24 128 lb (58.1 kg)  12/03/23 123 lb 9.6 oz (56.1 kg)  04/15/23 126 lb (57.2 kg)    GEN: Well nourished, well developed in no acute distress NECK: No JVD; No carotid bruits CARDIAC: RRR, no murmurs, rubs, gallops RESPIRATORY:  Clear to auscultation without rales, wheezing or rhonchi. Kyphotic  posture ABDOMEN: Soft, non-tender, non-distended EXTREMITIES:  No edema; No acute deformity   Asessement and Plan:.    1. Lower extremity edema, SOB - primary symptom is LE edema, also noting mild SOB with exertion or talking. She was started on triamterene hydrochlorothiazide  by PCP but reports this has caused some looser stools and she doesn't really like the way she feels on it. I do not appreciate any edema on exam today. She states that is because she elevated her legs today. That suggests a component of venous insufficiency. I will order pBNP, CMET (to assess Cr, albumin), TSH, CBC for evaluation and arrange 2D echocardiogram. Will await labwork before changing medication. If pBNP is normal, consider diltiazem -related edema combined with the summer heat. Kyphotic posture may be contributing to dyspnea as well. She is not tachycardic, tachypneic or hypoxic, and has no signs of VTE on examination today.  2. History of PAT - quiescent, no palpitations on metoprolol  and diltiazem .   3. Mild carotid artery disease with HLD - no recent progressive symptoms. Lipids are now being followed by primary care. Would defer to Dr. Shepard for future refills.    Disposition: F/u with me in 6 weeks.  Signed, Genesia Caslin N Delayni Streed, PA-C

## 2024-05-05 ENCOUNTER — Ambulatory Visit: Attending: Physician Assistant | Admitting: Physician Assistant

## 2024-05-05 ENCOUNTER — Encounter: Payer: Self-pay | Admitting: Physician Assistant

## 2024-05-05 VITALS — BP 130/70 | HR 71 | Ht 63.0 in | Wt 128.0 lb

## 2024-05-05 DIAGNOSIS — E785 Hyperlipidemia, unspecified: Secondary | ICD-10-CM

## 2024-05-05 DIAGNOSIS — I779 Disorder of arteries and arterioles, unspecified: Secondary | ICD-10-CM | POA: Diagnosis not present

## 2024-05-05 DIAGNOSIS — R600 Localized edema: Secondary | ICD-10-CM | POA: Diagnosis not present

## 2024-05-05 DIAGNOSIS — I4719 Other supraventricular tachycardia: Secondary | ICD-10-CM | POA: Diagnosis not present

## 2024-05-05 DIAGNOSIS — R0602 Shortness of breath: Secondary | ICD-10-CM

## 2024-05-05 NOTE — Patient Instructions (Signed)
 Medication Instructions:   Your physician recommends that you continue on your current medications as directed. Please refer to the Current Medication list given to you today.   *If you need a refill on your cardiac medications before your next appointment, please call your pharmacy*   Lab Work:  PLEASE GO DOWN STAIRS  [facility]  FIRST FLOOR   ( GET OFF ELEVATORS WALK TOWARDS WAITING AREA LAB LOCATED BY PHARMACY): CMET BNP CBC AND TSH TODAY     If you have labs (blood work) drawn today and your tests are completely normal, you will receive your results only by: MyChart Message (if you have MyChart) OR A paper copy in the mail If you have any lab test that is abnormal or we need to change your treatment, we will call you to review the results.   Testing/Procedures: Your physician has requested that you have an echocardiogram. Echocardiography is a painless test that uses sound waves to create images of your heart. It provides your doctor with information about the size and shape of your heart and how well your heart's chambers and valves are working. This procedure takes approximately one hour. There are no restrictions for this procedure. Please do NOT wear cologne, perfume, aftershave, or lotions (deodorant is allowed). Please arrive 15 minutes prior to your appointment time.  Please note: We ask at that you not bring children with you during ultrasound (echo/ vascular) testing. Due to room size and safety concerns, children are not allowed in the ultrasound rooms during exams. Our front office staff cannot provide observation of children in our lobby area while testing is being conducted. An adult accompanying a patient to their appointment will only be allowed in the ultrasound room at the discretion of the ultrasound technician under special circumstances. We apologize for any inconvenience.     Follow-Up: At [facility], you and your health needs are our priority.  As part  of our continuing mission to provide you with exceptional heart care, our providers are all part of one team.  This team includes your primary Cardiologist (physician) and Advanced Practice Providers or APPs (Physician Assistants and Nurse Practitioners) who all work together to provide you with the care you need, when you need it.   Your next appointment:    6 week(s) Provider:  DANA DUNN    We recommend signing up for the patient portal called MyChart.  Sign up information is provided on this After Visit Summary.  MyChart is used to connect with patients for Virtual Visits (Telemedicine).  Patients are able to view lab/test results, encounter notes, upcoming appointments, etc.  Non-urgent messages can be sent to your provider as well.   To learn more about what you can do with MyChart, go to [email or web address].   Other Instructions

## 2024-05-06 ENCOUNTER — Ambulatory Visit: Payer: Self-pay | Admitting: Physician Assistant

## 2024-05-06 DIAGNOSIS — I10 Essential (primary) hypertension: Secondary | ICD-10-CM

## 2024-05-06 DIAGNOSIS — R0602 Shortness of breath: Secondary | ICD-10-CM

## 2024-05-06 LAB — COMPREHENSIVE METABOLIC PANEL WITH GFR
ALT: 12 IU/L (ref 0–32)
AST: 23 IU/L (ref 0–40)
Albumin: 4.7 g/dL (ref 3.7–4.7)
Alkaline Phosphatase: 135 IU/L — ABNORMAL HIGH (ref 44–121)
BUN/Creatinine Ratio: 26 (ref 12–28)
BUN: 34 mg/dL — ABNORMAL HIGH (ref 8–27)
Bilirubin Total: 0.5 mg/dL (ref 0.0–1.2)
CO2: 23 mmol/L (ref 20–29)
Calcium: 10.3 mg/dL (ref 8.7–10.3)
Chloride: 97 mmol/L (ref 96–106)
Creatinine, Ser: 1.33 mg/dL — ABNORMAL HIGH (ref 0.57–1.00)
Globulin, Total: 3.4 g/dL (ref 1.5–4.5)
Glucose: 93 mg/dL (ref 70–99)
Potassium: 4.9 mmol/L (ref 3.5–5.2)
Sodium: 138 mmol/L (ref 134–144)
Total Protein: 8.1 g/dL (ref 6.0–8.5)
eGFR: 38 mL/min/{1.73_m2} — ABNORMAL LOW (ref >59)

## 2024-05-06 LAB — CBC
Hematocrit: 40.7 % (ref 34.0–46.6)
Hemoglobin: 12.8 g/dL (ref 11.1–15.9)
MCH: 27.8 pg (ref 26.6–33.0)
MCHC: 31.4 g/dL — ABNORMAL LOW (ref 31.5–35.7)
MCV: 89 fL (ref 79–97)
Platelets: 265 10*3/uL (ref 150–450)
RBC: 4.6 x10E6/uL (ref 3.77–5.28)
RDW: 11.9 % (ref 11.7–15.4)
WBC: 9.2 10*3/uL (ref 3.4–10.8)

## 2024-05-06 LAB — TSH: TSH: 1.82 u[IU]/mL (ref 0.450–4.500)

## 2024-05-06 LAB — PRO B NATRIURETIC PEPTIDE: NT-Pro BNP: 264 pg/mL (ref 0–738)

## 2024-05-07 ENCOUNTER — Other Ambulatory Visit: Payer: Self-pay | Admitting: *Deleted

## 2024-05-07 ENCOUNTER — Telehealth: Payer: Self-pay | Admitting: *Deleted

## 2024-05-07 ENCOUNTER — Ambulatory Visit: Admitting: Physician Assistant

## 2024-05-07 DIAGNOSIS — I10 Essential (primary) hypertension: Secondary | ICD-10-CM

## 2024-05-07 DIAGNOSIS — R0602 Shortness of breath: Secondary | ICD-10-CM

## 2024-05-07 MED ORDER — DILTIAZEM HCL ER COATED BEADS 180 MG PO CP24
180.0000 mg | ORAL_CAPSULE | Freq: Every day | ORAL | 3 refills | Status: AC
Start: 2024-05-07 — End: 2024-08-05

## 2024-05-07 NOTE — Telephone Encounter (Signed)
 See result note.

## 2024-06-12 DIAGNOSIS — M19072 Primary osteoarthritis, left ankle and foot: Secondary | ICD-10-CM | POA: Diagnosis not present

## 2024-06-12 DIAGNOSIS — M1990 Unspecified osteoarthritis, unspecified site: Secondary | ICD-10-CM | POA: Diagnosis not present

## 2024-06-12 DIAGNOSIS — M7989 Other specified soft tissue disorders: Secondary | ICD-10-CM | POA: Diagnosis not present

## 2024-06-12 DIAGNOSIS — M79672 Pain in left foot: Secondary | ICD-10-CM | POA: Diagnosis not present

## 2024-06-14 DIAGNOSIS — R0602 Shortness of breath: Secondary | ICD-10-CM | POA: Diagnosis not present

## 2024-06-14 DIAGNOSIS — I10 Essential (primary) hypertension: Secondary | ICD-10-CM | POA: Diagnosis not present

## 2024-06-15 ENCOUNTER — Ambulatory Visit: Payer: Self-pay

## 2024-06-15 LAB — COMPREHENSIVE METABOLIC PANEL WITH GFR
ALT: 16 IU/L (ref 0–32)
AST: 20 IU/L (ref 0–40)
Albumin: 4.4 g/dL (ref 3.7–4.7)
Alkaline Phosphatase: 125 IU/L — ABNORMAL HIGH (ref 44–121)
BUN/Creatinine Ratio: 23 (ref 12–28)
BUN: 22 mg/dL (ref 8–27)
Bilirubin Total: 0.5 mg/dL (ref 0.0–1.2)
CO2: 23 mmol/L (ref 20–29)
Calcium: 9.9 mg/dL (ref 8.7–10.3)
Chloride: 102 mmol/L (ref 96–106)
Creatinine, Ser: 0.96 mg/dL (ref 0.57–1.00)
Globulin, Total: 3 g/dL (ref 1.5–4.5)
Glucose: 125 mg/dL — ABNORMAL HIGH (ref 70–99)
Potassium: 4.2 mmol/L (ref 3.5–5.2)
Sodium: 141 mmol/L (ref 134–144)
Total Protein: 7.4 g/dL (ref 6.0–8.5)
eGFR: 57 mL/min/1.73 — ABNORMAL LOW (ref >59)

## 2024-06-21 ENCOUNTER — Ambulatory Visit (HOSPITAL_COMMUNITY)
Admission: RE | Admit: 2024-06-21 | Discharge: 2024-06-21 | Disposition: A | Discharge status: Home / Self Care | Source: Ambulatory Visit | Attending: Cardiology | Admitting: Cardiology

## 2024-06-21 DIAGNOSIS — R0602 Shortness of breath: Secondary | ICD-10-CM | POA: Diagnosis not present

## 2024-06-21 LAB — ECHOCARDIOGRAM COMPLETE
AR max vel: 2.66 cm2
AV Area VTI: 2.76 cm2
AV Area mean vel: 2.51 cm2
AV Mean grad: 3 mmHg
AV Peak grad: 4.8 mmHg
Ao pk vel: 1.09 m/s
Area-P 1/2: 2.71 cm2
MV M vel: 5.26 m/s
MV Peak grad: 110.7 mmHg
P 1/2 time: 612 ms
S' Lateral: 2.3 cm

## 2024-06-25 DIAGNOSIS — H25812 Combined forms of age-related cataract, left eye: Secondary | ICD-10-CM | POA: Diagnosis not present

## 2024-06-25 DIAGNOSIS — H2511 Age-related nuclear cataract, right eye: Secondary | ICD-10-CM | POA: Diagnosis not present

## 2024-06-25 DIAGNOSIS — H2512 Age-related nuclear cataract, left eye: Secondary | ICD-10-CM | POA: Diagnosis not present

## 2024-07-04 NOTE — Progress Notes (Unsigned)
 Cardiology Office Note    Date:  07/05/2024  ID:  Karina [name], Karina [name] January 23, 1933, MRN [id number] PCP:  Karina Ade, MD  Cardiologist:  Karina Parchment, MD  Electrophysiologist:  None   Chief Complaint: f/u edema, echo  History of Present Illness: .    Karina [name] is a 89 y.o. female with visit-pertinent history of paroxysmal atrial tachycardia, heart murmur, hyperlipidemia not followed by cardiology, and mild bilateral carotid stenosis. Monitor 2020 showed brief PAT and rare PVCs occasionally in bigeminal pattern. Echo 01/2022 showed EF 60-65%, G1DD, normal RV, trivial MR, aortic sclerosis without stenosis. She was recently seen 04/2024 for mild dyspnea and LE edema with varicose veins. She had not had any coughing, orthopnea, chest pain, palpitations. She saw primary care who started triamterene-hydrochlorothiazide . The edema improved but she developed looser stools without overt diarrhea therefore she wished to stop this. CXR was reported to be clear per patient by PCP. BNP was normal so edema was felt possibly r/t diltiazem  and varicose veins. Diltiazem  dose was reduced. 2D Echo showed EF 60-65%, mild ALVHBSS, G1DD, mildly elevated pulm pressure, normal RV function, moderate TR, mild dilation of ascending aorta.  She returns for follow-up overall stable. She denies any palpitations or chest pain. She reports continued mild swelling in her legs that appears insignificant on examination today. She also reports DOE with higher levels of activity. No SOB at rest. She has noticed fluctuations in her BP from 1teens to 180s.  Labwork independently reviewed: 06/14/24 K 4.2, CR 0.96, ap 125, AST ALT OK 04/2024 BNP wnl, TSh wnl, CBC OK, Cr 1.33  ROS: .    Please see the history of present illness.  All other systems are reviewed and otherwise negative.  Studies Reviewed: SABRA    EKG:  EKG is not ordered today  CV Studies: Cardiac studies reviewed are outlined and summarized above. Otherwise  please see EMR for full report.   Current Reported Medications:.    Current Meds  Medication Sig   acetaminophen  (TYLENOL ) 325 MG tablet Take 2 tablets (650 mg total) by mouth every 6 (six) hours as needed for mild pain (or Fever >/= 101).   ALPRAZolam  (XANAX ) 0.5 MG tablet Take 1 tablet (0.5 mg total) by mouth as needed for sleep or anxiety. 1/2 to 2 tabs po bid prn for anxiety, insomnia   aspirin  81 MG tablet Take 81 mg by mouth daily.   Cholecalciferol (VITAMIN D -3 PO) Take 5,000 Units by mouth in the morning and at bedtime.   Coenzyme Q10 (CO Q-10) 50 MG CAPS Take 1 capsule by mouth daily.   diltiazem  (CARDIZEM  CD) 180 MG 24 hr capsule Take 1 capsule (180 mg total) by mouth daily.   gatifloxacin (ZYMAXID) 0.5 % SOLN Place 1 drop into the right eye 4 (four) times daily.   ketorolac (ACULAR) 0.4 % SOLN Place 1 drop into the left eye 4 (four) times daily.   levothyroxine  (SYNTHROID ) 50 MCG tablet    metoprolol  succinate (TOPROL -XL) 50 MG 24 hr tablet Take 50 mg by mouth 2 (two) times daily.   Multiple Vitamins-Minerals (ALIVE WOMENS 50+) TABS Take 1 tablet by mouth daily.   pantoprazole  (PROTONIX ) 40 MG tablet Take 40 mg by mouth daily.   prednisoLONE acetate (PRED FORTE) 1 % ophthalmic suspension Place 1 drop into the right eye 4 (four) times daily.   rosuvastatin  (CRESTOR ) 10 MG tablet Take 1 tablet (10 mg total) by mouth daily.    Physical Exam:  VS:  BP 114/76   Pulse 74   Ht 5' 2 (1.575 m)   Wt 128 lb 6.4 oz (58.2 kg)   SpO2 94%   BMI 23.48 kg/m    Wt Readings from Last 3 Encounters:  07/05/24 128 lb 6.4 oz (58.2 kg)  05/05/24 128 lb (58.1 kg)  12/03/23 123 lb 9.6 oz (56.1 kg)    GEN: Well nourished, well developed in no acute distress NECK: No JVD; No carotid bruits CARDIAC: RRR, no murmurs, rubs, gallops RESPIRATORY:  Clear to auscultation without rales, wheezing or rhonchi  ABDOMEN: Soft, non-tender, non-distended EXTREMITIES:  No edema; No acute deformity    Asessement and Plan:.    1. Lower extremity edema - decreased diltiazem  at last OV as potential offending agent. Examination is reassuring. Do not appreciate any significant or pitting edema. Recent pBNP was normal and EF was normal. She also reports variable BPs at home at times but did not bring a log. She remains on diltiazem  180mg  daily (lowered from 240mg  due to edema) and metoprolol  50mg  BID. She inquired if there is something else she should add to her blood pressure regimen, but would like to follow her readings at home before making any changes. Initial BP 114/76 by CMA, recheck 138/70 by me. She states she cannot recheck them this week due to upcoming cataract surgery but will start the week after. Could consider trial of low dose spironolactone to regimen; note she had AKI with triamterene-hydrochlorothiazide  so would be conservative with dosing. As I suspect this is mostly venous insufficiency, recommended elevation of legs and compression stockings. Not tachycardic, tachypneic or hypoxic.  2. SOB - reports long history of chronic mild DOE with higher levels of exertion. pBNP wnl. We discussed stress testing and she does not wish to pursue. She wants to just follow this for now. Encouraged regular ongoing f/u PCP.  3. History of PAT - quiescent on regimen above. If she has breakthrough in the future, would favor titration of beta blocker over diltiazem  given h/o edema.  4. Mild carotid artery disease with HLD - no recent TIA/CVA symptoms. Lipids are now followed by PCP. Defer to Dr. Shepard for future refills.  5. Moderate TR, mild pHTN, mild dilation of ascending aorta - follow clinically. Could consider repeat echo in 1 year though reasonable to follow clinically with current age.    Disposition: F/u with Dr. Jeffrie or myself in 6 months.  Signed, Karina Williard N Eamonn Sermeno, PA-C

## 2024-07-05 ENCOUNTER — Ambulatory Visit: Attending: Physician Assistant | Admitting: Physician Assistant

## 2024-07-05 ENCOUNTER — Encounter: Payer: Self-pay | Admitting: Physician Assistant

## 2024-07-05 VITALS — BP 138/70 | HR 74 | Ht 62.0 in | Wt 128.4 lb

## 2024-07-05 DIAGNOSIS — I779 Disorder of arteries and arterioles, unspecified: Secondary | ICD-10-CM | POA: Diagnosis not present

## 2024-07-05 DIAGNOSIS — I77810 Thoracic aortic ectasia: Secondary | ICD-10-CM

## 2024-07-05 DIAGNOSIS — I4719 Other supraventricular tachycardia: Secondary | ICD-10-CM

## 2024-07-05 DIAGNOSIS — R600 Localized edema: Secondary | ICD-10-CM | POA: Diagnosis not present

## 2024-07-05 DIAGNOSIS — R0602 Shortness of breath: Secondary | ICD-10-CM | POA: Diagnosis not present

## 2024-07-05 DIAGNOSIS — I071 Rheumatic tricuspid insufficiency: Secondary | ICD-10-CM

## 2024-07-05 DIAGNOSIS — I272 Pulmonary hypertension, unspecified: Secondary | ICD-10-CM

## 2024-07-05 NOTE — Patient Instructions (Signed)
 Medication Instructions:   Your physician recommends that you continue on your current medications as directed. Please refer to the Current Medication list given to you today.   *If you need a refill on your cardiac medications before your next appointment, please call your pharmacy*  Lab Work: NONE ORDERED  TODAY    If you have labs (blood work) drawn today and your tests are completely normal, you will receive your results only by: MyChart Message (if you have MyChart) OR A paper copy in the mail If you have any lab test that is abnormal or we need to change your treatment, we will call you to review the results.  Testing/Procedures: NONE ORDERED  TODAY    Follow-Up: At [facility], you and your health needs are our priority.  As part of our continuing mission to provide you with exceptional heart care, our providers are all part of one team.  This team includes your primary Cardiologist (physician) and Advanced Practice Providers or APPs (Physician Assistants and Nurse Practitioners) who all work together to provide you with the care you need, when you need it.  Your next appointment:   6 month(s)  Provider:    Oneil Parchment, MD  /APP   We recommend signing up for the patient portal called MyChart.  Sign up information is provided on this After Visit Summary.  MyChart is used to connect with patients for Virtual Visits (Telemedicine).  Patients are able to view lab/test results, encounter notes, upcoming appointments, etc.  Non-urgent messages can be sent to your provider as well.   To learn more about what you can do with MyChart, go to [email or web address].   Other Instructions:

## 2024-07-09 DIAGNOSIS — H2511 Age-related nuclear cataract, right eye: Secondary | ICD-10-CM | POA: Diagnosis not present

## 2024-07-09 DIAGNOSIS — H25811 Combined forms of age-related cataract, right eye: Secondary | ICD-10-CM | POA: Diagnosis not present

## 2024-07-09 DIAGNOSIS — H25011 Cortical age-related cataract, right eye: Secondary | ICD-10-CM | POA: Diagnosis not present

## 2024-07-09 DIAGNOSIS — H25041 Posterior subcapsular polar age-related cataract, right eye: Secondary | ICD-10-CM | POA: Diagnosis not present

## 2024-07-12 ENCOUNTER — Encounter (HOSPITAL_COMMUNITY): Payer: Self-pay

## 2024-07-12 ENCOUNTER — Emergency Department (HOSPITAL_COMMUNITY): Admission: EM | Admit: 2024-07-12 | Discharge: 2024-07-12 | Disposition: A | Discharge status: Home / Self Care

## 2024-07-12 ENCOUNTER — Other Ambulatory Visit: Payer: Self-pay

## 2024-07-12 DIAGNOSIS — Z7982 Long term (current) use of aspirin: Secondary | ICD-10-CM | POA: Diagnosis not present

## 2024-07-12 DIAGNOSIS — Z79899 Other long term (current) drug therapy: Secondary | ICD-10-CM | POA: Insufficient documentation

## 2024-07-12 DIAGNOSIS — R42 Dizziness and giddiness: Secondary | ICD-10-CM

## 2024-07-12 DIAGNOSIS — I10 Essential (primary) hypertension: Secondary | ICD-10-CM | POA: Insufficient documentation

## 2024-07-12 LAB — CBC
HCT: 39.4 % (ref 36.0–46.0)
Hemoglobin: 12.5 g/dL (ref 12.0–15.0)
MCH: 28.2 pg (ref 26.0–34.0)
MCHC: 31.7 g/dL (ref 30.0–36.0)
MCV: 88.7 fL (ref 80.0–100.0)
Platelets: 225 K/uL (ref 150–400)
RBC: 4.44 MIL/uL (ref 3.87–5.11)
RDW: 13.2 % (ref 11.5–15.5)
WBC: 7.8 K/uL (ref 4.0–10.5)
nRBC: 0 % (ref 0.0–0.2)

## 2024-07-12 LAB — COMPREHENSIVE METABOLIC PANEL WITH GFR
ALT: 17 U/L (ref 0–44)
AST: 22 U/L (ref 15–41)
Albumin: 4 g/dL (ref 3.5–5.0)
Alkaline Phosphatase: 94 U/L (ref 38–126)
Anion gap: 12 (ref 5–15)
BUN: 19 mg/dL (ref 8–23)
CO2: 24 mmol/L (ref 22–32)
Calcium: 9.9 mg/dL (ref 8.9–10.3)
Chloride: 103 mmol/L (ref 98–111)
Creatinine, Ser: 1.15 mg/dL — ABNORMAL HIGH (ref 0.44–1.00)
GFR, Estimated: 46 mL/min — ABNORMAL LOW (ref >60)
Glucose, Bld: 125 mg/dL — ABNORMAL HIGH (ref 70–99)
Potassium: 4.2 mmol/L (ref 3.5–5.1)
Sodium: 139 mmol/L (ref 135–145)
Total Bilirubin: 0.8 mg/dL (ref 0.0–1.2)
Total Protein: 7.7 g/dL (ref 6.5–8.1)

## 2024-07-12 NOTE — Discharge Instructions (Addendum)
 Stay on your medications as prescribed.  Call your primary care doctor and your cardiologist tomorrow to make follow-up appointments for later this week or early next week.  Return to the ER for any new or worsening symptoms.

## 2024-07-12 NOTE — ED Provider Notes (Signed)
 [name] EMERGENCY DEPARTMENT AT [facility] Provider Note   CSN: [id number] Arrival date & time: 07/12/24  1309     Patient presents with: Hypertension and Dizziness   Karina [name] is a 89 y.o. female.   89 year old female here for elevated blood pressure.  States she felt dizzy today and checked her blood pressure and it has been between 170s and 190 systolic.  States she recently had a decrease in her diltiazem  by cardiology and as when she started noticing some elevated blood pressure.  Blood pressures improved without intervention and she is feeling better and asymptomatic at this time.  Nuys any other symptoms or concerns.   Hypertension Pertinent negatives include no chest pain, no abdominal pain and no shortness of breath.  Dizziness Associated symptoms: no chest pain, no palpitations, no shortness of breath and no vomiting        Prior to Admission medications   Medication Sig Start Date End Date Taking? Authorizing Provider  acetaminophen  (TYLENOL ) 325 MG tablet Take 2 tablets (650 mg total) by mouth every 6 (six) hours as needed for mild pain (or Fever >/= 101). 01/16/22   Sherrill Cable Latif, DO  ALPRAZolam  (XANAX ) 0.5 MG tablet Take 1 tablet (0.5 mg total) by mouth as needed for sleep or anxiety. 1/2 to 2 tabs po bid prn for anxiety, insomnia 10/27/15   Domenica Harlene LABOR, MD  aspirin  81 MG tablet Take 81 mg by mouth daily.    [provider]  Cholecalciferol (VITAMIN D -3 PO) Take 5,000 Units by mouth in the morning and at bedtime.    [provider]  Coenzyme Q10 (CO Q-10) 50 MG CAPS Take 1 capsule by mouth daily.    [provider]  diltiazem  (CARDIZEM  CD) 180 MG 24 hr capsule Take 1 capsule (180 mg total) by mouth daily. 05/07/24 08/05/24  Dunn, Dayna N, PA-C  gatifloxacin (ZYMAXID) 0.5 % SOLN Place 1 drop into the right eye 4 (four) times daily. 06/25/24   [provider]  ketorolac (ACULAR) 0.4 % SOLN Place 1 drop into the  left eye 4 (four) times daily. 04/23/24   [provider]  levothyroxine  (SYNTHROID ) 50 MCG tablet  08/27/23   [provider]  metoprolol  succinate (TOPROL -XL) 50 MG 24 hr tablet Take 50 mg by mouth 2 (two) times daily.    [provider]  Multiple Vitamins-Minerals (ALIVE WOMENS 50+) TABS Take 1 tablet by mouth daily. 12/23/16   [provider]  pantoprazole  (PROTONIX ) 40 MG tablet Take 40 mg by mouth daily.    [provider]  prednisoLONE acetate (PRED FORTE) 1 % ophthalmic suspension Place 1 drop into the right eye 4 (four) times daily. 06/25/24   [provider]  rosuvastatin  (CRESTOR ) 10 MG tablet Take 1 tablet (10 mg total) by mouth daily. 12/03/23   Jeffrie Oneil BROCKS, MD  saccharomyces boulardii (FLORASTOR) 250 MG capsule Take 1 capsule (250 mg total) by mouth 3 (three) times daily. 05/26/22   Regalado, Owen LABOR, MD    Allergies: Patient has no known allergies.    Review of Systems  Constitutional:  Negative for chills and fever.  HENT:  Negative for ear pain and sore throat.   Eyes:  Negative for pain and visual disturbance.  Respiratory:  Negative for cough and shortness of breath.   Cardiovascular:  Negative for chest pain and palpitations.  Gastrointestinal:  Negative for abdominal pain and vomiting.  Genitourinary:  Negative for dysuria and hematuria.  Musculoskeletal:  Negative for arthralgias and back pain.  Skin:  Negative for color change and rash.  Neurological:  Positive for dizziness. Negative for seizures and syncope.  All other systems reviewed and are negative.   Updated Vital Signs BP (!) 163/74   Pulse 66   Temp 97.7 F (36.5 C)   Resp 20   SpO2 97%   Physical Exam Vitals and nursing note reviewed.  Constitutional:      General: She is not in acute distress.    Appearance: Normal appearance. She is well-developed. She is not ill-appearing.  HENT:     Head: Normocephalic and atraumatic.  Eyes:      Conjunctiva/sclera: Conjunctivae normal.  Cardiovascular:     Rate and Rhythm: Normal rate and regular rhythm.     Heart sounds: No murmur heard. Pulmonary:     Effort: Pulmonary effort is normal. No respiratory distress.     Breath sounds: Normal breath sounds.  Abdominal:     Palpations: Abdomen is soft.     Tenderness: There is no abdominal tenderness.  Musculoskeletal:        General: No swelling.     Cervical back: Neck supple.  Skin:    General: Skin is warm and dry.     Capillary Refill: Capillary refill takes less than 2 seconds.  Neurological:     General: No focal deficit present.     Mental Status: She is alert.  Psychiatric:        Mood and Affect: Mood normal.     (all labs ordered are listed, but only abnormal results are displayed) Labs Reviewed  COMPREHENSIVE METABOLIC PANEL WITH GFR - Abnormal; Notable for the following components:      Result Value   Glucose, Bld 125 (*)    Creatinine, Ser 1.15 (*)    GFR, Estimated 46 (*)    All other components within normal limits  CBC  URINALYSIS, ROUTINE W REFLEX MICROSCOPIC    EKG: EKG Interpretation Date/Time:  Monday July 12 2024 13:27:04 EDT Ventricular Rate:  73 PR Interval:  176 QRS Duration:  82 QT Interval:  416 QTC Calculation: 458 R Axis:   10  Text Interpretation: Normal sinus rhythm Septal infarct , age undetermined Abnormal ECG Compared with previous EKG from 05/05/2024 Confirmed by Gennaro Bouchard ([id number]) on 07/12/2024 2:06:32 PM  Radiology: No results found.   Procedures   Medications Ordered in the ED - No data to display                                  Medical Decision Making Cardiac monitor interpretation: Sinus rhythm, no ectopy  Patient here for high blood pressure and dizziness.  Blood pressure was 1 7190 systolic at home.  It does come down to 150s to 160s urinary symptoms have resolved.  Lab workup unremarkable.  She did recently had a decrease in her Cardizem  by  cardiology.  I advised her to call and follow-up with cardiology and primary care for management of her blood pressure medications or to return to the ER for any worsening symptoms.  She feels comfortable with  the plan to be discharged home.  Problems Addressed: Dizziness: acute illness or injury Hypertension, unspecified type: chronic illness or injury  Amount and/or Complexity of Data Reviewed External Data Reviewed: notes.    Details: Outpatient records reviewed and patient was seen in the cardiologist a few weeks ago and  had her diltiazem  decreased Labs: ordered. Decision-making details documented in ED Course.    Details: Ordered and reviewed by me and unremarkable ECG/medicine tests: ordered and independent interpretation performed. Decision-making details documented in ED Course.    Details: Ordered and interpreted me in the absence of cardiology and shows sinus rhythm, no STEMI and no acute change when compared to prior EKG  Risk OTC drugs. Prescription drug management.    Final diagnoses:  Hypertension, unspecified type  Dizziness    ED Discharge Orders     None          Gennaro Duwaine CROME, DO 07/12/24 1607

## 2024-07-12 NOTE — ED Triage Notes (Signed)
 Pt c.o dizziness and Htn today. Pt denies headache.

## 2024-07-26 DIAGNOSIS — I471 Supraventricular tachycardia, unspecified: Secondary | ICD-10-CM | POA: Diagnosis not present

## 2024-07-26 DIAGNOSIS — I10 Essential (primary) hypertension: Secondary | ICD-10-CM | POA: Diagnosis not present

## 2024-09-07 DIAGNOSIS — J019 Acute sinusitis, unspecified: Secondary | ICD-10-CM | POA: Diagnosis not present

## 2024-11-08 ENCOUNTER — Other Ambulatory Visit: Payer: Self-pay

## 2024-11-10 MED ORDER — ROSUVASTATIN CALCIUM 10 MG PO TABS: 10.0000 mg | ORAL_TABLET | Freq: Every day | ORAL | 2 refills | Status: AC
